# Patient Record
Sex: Male | Born: 1970 | Race: White | Hispanic: No | Smoking: Current every day smoker
Health system: Southern US, Community
[De-identification: ages and names within clinical notes are randomized; demographics above are authoritative.]

## PROBLEM LIST (undated history)

## (undated) DIAGNOSIS — Z87442 Personal history of urinary calculi: Secondary | ICD-10-CM

## (undated) DIAGNOSIS — G35 Multiple sclerosis: Secondary | ICD-10-CM

## (undated) DIAGNOSIS — M545 Low back pain, unspecified: Secondary | ICD-10-CM

## (undated) DIAGNOSIS — G9619 Other disorders of meninges, not elsewhere classified: Secondary | ICD-10-CM

## (undated) DIAGNOSIS — F32A Depression, unspecified: Secondary | ICD-10-CM

## (undated) DIAGNOSIS — F329 Major depressive disorder, single episode, unspecified: Secondary | ICD-10-CM

## (undated) DIAGNOSIS — G43909 Migraine, unspecified, not intractable, without status migrainosus: Secondary | ICD-10-CM

## (undated) DIAGNOSIS — G971 Other reaction to spinal and lumbar puncture: Secondary | ICD-10-CM

## (undated) DIAGNOSIS — J449 Chronic obstructive pulmonary disease, unspecified: Secondary | ICD-10-CM

## (undated) DIAGNOSIS — G8929 Other chronic pain: Secondary | ICD-10-CM

## (undated) HISTORY — DX: Depression, unspecified: F32.A

## (undated) HISTORY — PX: LITHOTRIPSY: SUR834

## (undated) HISTORY — PX: EPIDURAL BLOOD PATCH: SHX1517

## (undated) HISTORY — DX: Chronic obstructive pulmonary disease, unspecified: J44.9

## (undated) HISTORY — PX: BACK SURGERY: SHX140

## (undated) HISTORY — DX: Major depressive disorder, single episode, unspecified: F32.9

---

## 2001-08-21 ENCOUNTER — Encounter: Payer: Self-pay | Admitting: *Deleted

## 2001-08-21 ENCOUNTER — Emergency Department (HOSPITAL_COMMUNITY): Admission: EM | Admit: 2001-08-21 | Discharge: 2001-08-21 | Payer: Self-pay | Admitting: *Deleted

## 2003-04-06 ENCOUNTER — Encounter: Payer: Self-pay | Admitting: Emergency Medicine

## 2003-04-07 ENCOUNTER — Observation Stay (HOSPITAL_COMMUNITY): Admission: EM | Admit: 2003-04-07 | Discharge: 2003-04-07 | Payer: Self-pay | Admitting: Emergency Medicine

## 2003-04-07 ENCOUNTER — Encounter: Payer: Self-pay | Admitting: Orthopedic Surgery

## 2004-03-11 ENCOUNTER — Emergency Department (HOSPITAL_COMMUNITY): Admission: EM | Admit: 2004-03-11 | Discharge: 2004-03-11 | Payer: Self-pay | Admitting: Emergency Medicine

## 2004-03-23 ENCOUNTER — Emergency Department (HOSPITAL_COMMUNITY): Admission: EM | Admit: 2004-03-23 | Discharge: 2004-03-23 | Payer: Self-pay | Admitting: Emergency Medicine

## 2004-06-07 ENCOUNTER — Encounter: Payer: Self-pay | Admitting: *Deleted

## 2004-06-07 ENCOUNTER — Inpatient Hospital Stay (HOSPITAL_COMMUNITY): Admission: EM | Admit: 2004-06-07 | Discharge: 2004-06-09 | Payer: Self-pay

## 2005-07-03 ENCOUNTER — Emergency Department (HOSPITAL_COMMUNITY): Admission: EM | Admit: 2005-07-03 | Discharge: 2005-07-03 | Payer: Self-pay | Admitting: Emergency Medicine

## 2014-07-04 DIAGNOSIS — G9389 Other specified disorders of brain: Secondary | ICD-10-CM | POA: Diagnosis not present

## 2014-07-04 DIAGNOSIS — M4040 Postural lordosis, site unspecified: Secondary | ICD-10-CM | POA: Diagnosis not present

## 2014-07-04 DIAGNOSIS — M4180 Other forms of scoliosis, site unspecified: Secondary | ICD-10-CM | POA: Diagnosis not present

## 2014-07-08 DIAGNOSIS — R5381 Other malaise: Secondary | ICD-10-CM | POA: Diagnosis not present

## 2014-07-08 DIAGNOSIS — G3184 Mild cognitive impairment, so stated: Secondary | ICD-10-CM | POA: Diagnosis not present

## 2014-07-08 DIAGNOSIS — G894 Chronic pain syndrome: Secondary | ICD-10-CM | POA: Diagnosis not present

## 2014-07-08 DIAGNOSIS — G35 Multiple sclerosis: Secondary | ICD-10-CM | POA: Diagnosis not present

## 2014-07-08 DIAGNOSIS — R5383 Other fatigue: Secondary | ICD-10-CM | POA: Diagnosis not present

## 2014-07-08 DIAGNOSIS — M5430 Sciatica, unspecified side: Secondary | ICD-10-CM | POA: Diagnosis not present

## 2014-07-11 DIAGNOSIS — G35 Multiple sclerosis: Secondary | ICD-10-CM | POA: Diagnosis not present

## 2014-07-12 DIAGNOSIS — G35 Multiple sclerosis: Secondary | ICD-10-CM | POA: Diagnosis not present

## 2014-07-29 DIAGNOSIS — G894 Chronic pain syndrome: Secondary | ICD-10-CM | POA: Diagnosis not present

## 2014-07-29 DIAGNOSIS — B182 Chronic viral hepatitis C: Secondary | ICD-10-CM | POA: Diagnosis not present

## 2014-07-29 DIAGNOSIS — G3184 Mild cognitive impairment, so stated: Secondary | ICD-10-CM | POA: Diagnosis not present

## 2014-07-29 DIAGNOSIS — G35 Multiple sclerosis: Secondary | ICD-10-CM | POA: Diagnosis not present

## 2014-07-29 DIAGNOSIS — R531 Weakness: Secondary | ICD-10-CM | POA: Diagnosis not present

## 2014-08-26 DIAGNOSIS — R531 Weakness: Secondary | ICD-10-CM | POA: Diagnosis not present

## 2014-08-26 DIAGNOSIS — B182 Chronic viral hepatitis C: Secondary | ICD-10-CM | POA: Diagnosis not present

## 2014-08-26 DIAGNOSIS — G3184 Mild cognitive impairment, so stated: Secondary | ICD-10-CM | POA: Diagnosis not present

## 2014-08-26 DIAGNOSIS — G35 Multiple sclerosis: Secondary | ICD-10-CM | POA: Diagnosis not present

## 2014-09-09 DIAGNOSIS — G35 Multiple sclerosis: Secondary | ICD-10-CM | POA: Diagnosis not present

## 2014-09-16 DIAGNOSIS — S92351A Displaced fracture of fifth metatarsal bone, right foot, initial encounter for closed fracture: Secondary | ICD-10-CM | POA: Diagnosis not present

## 2014-09-16 DIAGNOSIS — W1839XA Other fall on same level, initial encounter: Secondary | ICD-10-CM | POA: Diagnosis not present

## 2014-09-16 DIAGNOSIS — S0081XA Abrasion of other part of head, initial encounter: Secondary | ICD-10-CM | POA: Diagnosis not present

## 2014-09-16 DIAGNOSIS — R0789 Other chest pain: Secondary | ICD-10-CM | POA: Diagnosis not present

## 2014-10-07 DIAGNOSIS — G35 Multiple sclerosis: Secondary | ICD-10-CM | POA: Diagnosis not present

## 2014-10-28 DIAGNOSIS — G35 Multiple sclerosis: Secondary | ICD-10-CM | POA: Diagnosis not present

## 2014-10-28 DIAGNOSIS — R531 Weakness: Secondary | ICD-10-CM | POA: Diagnosis not present

## 2014-10-28 DIAGNOSIS — G3184 Mild cognitive impairment, so stated: Secondary | ICD-10-CM | POA: Diagnosis not present

## 2014-10-28 DIAGNOSIS — B182 Chronic viral hepatitis C: Secondary | ICD-10-CM | POA: Diagnosis not present

## 2014-10-29 DIAGNOSIS — G35 Multiple sclerosis: Secondary | ICD-10-CM | POA: Diagnosis not present

## 2014-10-29 DIAGNOSIS — M545 Low back pain: Secondary | ICD-10-CM | POA: Diagnosis not present

## 2014-11-11 DIAGNOSIS — G35 Multiple sclerosis: Secondary | ICD-10-CM | POA: Diagnosis not present

## 2014-11-28 DIAGNOSIS — F328 Other depressive episodes: Secondary | ICD-10-CM | POA: Diagnosis not present

## 2014-11-28 DIAGNOSIS — Z5181 Encounter for therapeutic drug level monitoring: Secondary | ICD-10-CM | POA: Diagnosis not present

## 2014-11-28 DIAGNOSIS — G35 Multiple sclerosis: Secondary | ICD-10-CM | POA: Diagnosis not present

## 2014-11-28 DIAGNOSIS — M545 Low back pain: Secondary | ICD-10-CM | POA: Diagnosis not present

## 2014-11-28 DIAGNOSIS — M542 Cervicalgia: Secondary | ICD-10-CM | POA: Diagnosis not present

## 2014-12-09 DIAGNOSIS — G35 Multiple sclerosis: Secondary | ICD-10-CM | POA: Diagnosis not present

## 2014-12-13 ENCOUNTER — Emergency Department (HOSPITAL_COMMUNITY)
Admission: EM | Admit: 2014-12-13 | Discharge: 2014-12-13 | Disposition: A | Discharge status: Home / Self Care | Payer: Medicare Other | Source: Home / Self Care | Attending: Emergency Medicine | Admitting: Emergency Medicine

## 2014-12-13 ENCOUNTER — Emergency Department (INDEPENDENT_AMBULATORY_CARE_PROVIDER_SITE_OTHER): Payer: Medicare Other

## 2014-12-13 ENCOUNTER — Encounter (HOSPITAL_COMMUNITY): Payer: Self-pay | Admitting: Emergency Medicine

## 2014-12-13 DIAGNOSIS — S42032A Displaced fracture of lateral end of left clavicle, initial encounter for closed fracture: Secondary | ICD-10-CM

## 2014-12-13 DIAGNOSIS — S20212A Contusion of left front wall of thorax, initial encounter: Secondary | ICD-10-CM

## 2014-12-13 HISTORY — DX: Multiple sclerosis: G35

## 2014-12-13 MED ORDER — OXYCODONE HCL 10 MG PO TABS: 10.0000 mg | ORAL_TABLET | Freq: Four times a day (QID) | ORAL | Status: DC | PRN

## 2014-12-13 NOTE — Discharge Instructions (Signed)
Clavicle Fracture (Distal End) °with Rehab °Distal clavicle fractures are breaks in the collarbone (clavicle) that occur in the outer third portion of the bone, near the joint between the collarbone and one of the shoulder bones (acromion). These breaks (fractures) may be complete or incomplete. If the fracture extends into the joint at the top of the shoulder, it may also cause damage to the ligaments there (acromioclavicular and coracoclavicular). These two ligaments are responsible for attaching the collarbone to the shoulder bone. °SYMPTOMS  °· Pain, tenderness, and swelling on top of the shoulder. °· Visible deformity or bump over the fracture site, if the fracture is complete and the bone fragments separate enough to distort the appearance of the top of the shoulder. °· Bruising (contusion) at the site of injury (usually within 48 hours). °· Loss of strength, or pain with use of the affected arm. °· Sometimes, numbness or coldness in the shoulder and arm on the affected side if the blood supply is impaired. °· Uncommonly, shortness of breath or difficulty breathing. °CAUSES  °Distal clavicle fractures are usually caused by direct hit (trauma) to the area of injury. The injury may also occur from indirect trauma, such as falling on an outstretched hand (uncommon). °RISK INCREASES WITH: °· Sports that require contact or collision (football, soccer, hockey, rugby). °· Sports with high risk of falling on the shoulder (rodeo riding, mountain bike riding, cycling). °· Previous shoulder injury. °· Improperly fitted or padded protective equipment. °· History of bone or joint disease (osteoporosis, bone tumors). °PREVENTION °· Warm up and stretch properly before activity. °· Maintain physical fitness: °¨ Strength, flexibility, and endurance. °¨ Cardiovascular fitness. °· Wear properly fitted and padded protective equipment. °· Learn and use proper technique, and have a coach correct improper technique (including  falling). °PROGNOSIS  °If treated properly, distal clavicle fractures usually can be expected to heal. However, surgery may be needed.  °RELATED COMPLICATIONS  °· Pressure on or injury to nearby nerves, ligaments, tendons, muscles, blood vessels, or other tissues. °· Weakness and fatigue of the arm or shoulder (uncommon). °· Fracture fails to heal (nonunion). °· Fracture heals in improper position (malunion). °· Arthritis, pain, and inflammation of the acromioclavicular (AC) joint. °· Longer healing time and vulnerable to recurring injury, if usual activities are resumed too soon. °· Excessive scar tissue at the fracture site, including excessive bone formation, causing pressure on nerves and blood vessels in the neck or armpit. This may lead to pain, numbness, and tingling in the neck, shoulder, arms, and hands. °· Infection if the bone breaks through the skin (open fracture), or at the incision if surgery is performed. °· Persistent bump (prominence) at the fracture site. °· Vulnerable to repeated collarbone injury. °TREATMENT  °Treatment first involves the use of ice, medicine, and compressive bandages to reduce pain and inflammation. The shoulder should be immediately restrained. It is important to have an orthopedic specialist look at the fracture to determine if surgery is needed to realign the bones if the fracture is out of alignment. Surgery involves repositioning the bones and fixing them in place with screws, pins, and plates. It may be necessary to remove the hardware after the fracture heals. After the fracture heals, it is important to complete stretching and strengthening exercises in order to regain strength and a full range of motion before you are able to return to sports. These exercises may be completed at home or with a therapist. If surgery is required, return to sports can be expected   in 2 to 6 months.  °MEDICATION  °· If pain medicine is needed, nonsteroidal anti-inflammatory medicines  (aspirin and ibuprofen), or other minor pain relievers (acetaminophen), are often advised. °· Do not take pain medicine for 7 days before surgery. °· Prescription pain relievers may be given if your caregiver thinks they are needed. Use only as directed and only as much as you need. °COLD THERAPY  °· Cold treatment (icing) should be applied for 10 to 15 minutes every 2 to 3 hours for inflammation and pain, and immediately after activity that aggravates your symptoms. Use ice packs or an ice massage. °SEEK MEDICAL CARE IF:  °· Pain, swelling, or bruising gets worse despite treatment. °· You experience pain, numbness, or coldness in the arm. °· Blue, gray, or dark color appears in the hand or fingernails. °· You have increased pain, swelling, or drainage of fluids in the affected area. °· You experience signs of infection: increased pain, swelling, drainage of fluids, fever, or a general ill feeling. °· New, unexplained symptoms develop. (Drugs used in treatment may produce side effects.) °EXERCISES °RANGE OF MOTION (ROM) AND STRETCHING EXERCISES - Clavicle Fracture (Distal End) °These exercises may help you restore your elbow mobility once your physician has discontinued your restraint period. Beginning exercises before your caregiver's approval may result in delayed healing. Your symptoms may go away with or without further involvement from your physician, physical therapist, or athletic trainer. While completing these exercises, remember:  °· Restoring tissue flexibility helps normal motion to return to the joints. This allows healthier, less painful movement and activity. °· An effective stretch should be held for at least 30 seconds. A stretch should never be painful. You should only feel a gentle lengthening or release in the stretch. °ROM - Pendulum  °· Bend at the waist, so that your right / left arm falls away from your body. Support yourself with your opposite hand on a solid surface, such as a table or a  countertop. °· Your right / left arm should be perpendicular to the ground. If it is not perpendicular, you need to lean over farther. Relax the muscles in your right / left arm and shoulder as much as possible. °· Gently sway your hips and trunk, so they move your right / left arm without any use of your right / left shoulder muscles. °· Progress your movements so that your right / left arm moves side to side, then forward and backward, and finally, both clockwise and counterclockwise. °· Complete __________ repetitions in each direction. Many people use this exercise to relieve discomfort in their shoulder, as well as to gain range of motion. °Repeat __________ times. Complete this exercise __________ times per day. °STRETCH - Flexion, Seated  °· Sit in a firm chair, so that your right / left forearm can rest on a table or countertop. Your right / left elbow should rest below the height of your shoulder, so that your shoulder feels supported and not tense or uncomfortable. °· Keeping your right / left shoulder relaxed, lean forward at your waist, allowing your right / left hand to slide forward. Bend forward until you feel a moderate stretch in your shoulder, but before you feel an increase in your pain. °· Hold for __________ seconds. Slowly return to your starting position. °Repeat __________ times. Complete this exercise __________ times per day.  °STRETCH - Flexion, Standing  °· Stand with good posture. With an underhand grip on your right / left hand and an overhand   grip on the opposite hand, grasp a broomstick or cane so that your hands are a little more than shoulder width apart. °· Keeping your right / left elbow straight and shoulder muscles relaxed, push the stick with your opposite hand to raise your right / left arm in front of your body and then overhead. Raise your arm until you feel a stretch in your right / left shoulder, but before you have increased shoulder pain. °· Try to avoid shrugging your  right / left shoulder as your arm rises, by keeping your shoulder blade tucked down and toward your mid-back spine. Hold for __________ seconds. °· Slowly return to the starting position. °Repeat __________ times. Complete this exercise __________ times per day.  °STRETCH - Abduction, Supine  °· Lie on your back. With an underhand grip on your right / left hand and an overhand grip on the opposite hand, grasp a broomstick or cane so that your hands are a little more than shoulder width apart. °· Keeping your right / left elbow straight and shoulder muscles relaxed, push the stick with your opposite hand to raise your right / left arm out to the side of your body and then overhead. Raise your arm until you feel a stretch in your right / left shoulder, but before you have increased shoulder pain. °· Try to avoid shrugging your right / left shoulder as your arm rises, by keeping your shoulder blade tucked down and toward your mid-back spine. Hold for __________ seconds. °· Slowly return to the starting position. °Repeat __________ times. Complete this exercise __________ times per day.  °ROM - Flexion, Active-Assisted °· Lie on your back. You may bend your knees for comfort. °· Grasp a broomstick or cane, so your hands are about shoulder width apart. Your right / left hand should grip the end of the stick, so that your hand is positioned "thumbs-up," as if you were about to shake hands. °· Using your healthy arm to lead, raise your right / left arm overhead, until you feel a gentle stretch in your shoulder. Hold for __________ seconds. °· Use the stick to assist in returning your right / left arm to its starting position. °Repeat __________ times. Complete this exercise __________ times per day.  °STRETCH - Flexion, Standing  °· Stand facing a wall. Walk your right / left fingers up the wall, until you feel a moderate stretch in your shoulder. As your hand gets higher, you may need to step closer to the wall or use a  door frame to walk through. °· Try to avoid shrugging your right / left shoulder as your arm rises, by keeping your shoulder blade tucked down and toward your mid-back spine. °· Hold for __________ seconds. Use your other hand, if needed, to ease out of the stretch and return to the starting position. °Repeat __________ times. Complete this exercise __________ times per day.  °STRENGTHENING EXERCISES - Clavicle Fracture (Distal End) °These exercises may help you when beginning to rehabilitate your injury. They may resolve your symptoms with or without further involvement from your physician, physical therapist or athletic trainer. While completing these exercises, remember:  °· Muscles can gain both the endurance and the strength needed for everyday activities through controlled exercises. °· Complete these exercises as instructed by your physician, physical therapist or athletic trainer. Increase the resistance and repetitions only as guided. °· You may experience muscle soreness or fatigue, but the pain or discomfort you are trying to eliminate should never   worsen during these exercises. If this pain does get worse, stop and make sure you are following the directions exactly. If the pain is still present after adjustments, discontinue the exercise until you can discuss the trouble with your caregiver. °STRENGTH - Shoulder Abductors, Isometric  °· With good posture, stand or sit about 4-6 inches from a wall, with your right / left side facing the wall. °· Bend your right / left elbow. Gently press your right / left elbow into the wall. Increase the pressure gradually until you are pressing as hard as you can, without shrugging your shoulder or increasing any shoulder discomfort. °· Hold for __________ seconds. °· Release the tension slowly. Relax your shoulder muscles completely before you start the next repetition. °Repeat __________ times. Complete this exercise __________ times per day.  °STRENGTH - Shoulder  Flexion, Isometric  °· With good posture, stand or sit about 4-6 inches from a wall. °· Keeping your right / left elbow straight, gently press the top of your fist into the wall. Increase the pressure gradually until you are pressing as hard as you can, without shrugging your shoulder or increasing any shoulder discomfort. °· Hold for __________ seconds. °· Release the tension slowly. Relax your shoulder muscles completely before you start the next repetition. °Repeat __________ times. Complete this exercise __________ times per day.  °STRENGTH - External Rotators °· Secure a rubber exercise band or tubing to a fixed object (table, pole), so that it is at the same height as your right / left elbow, when you are standing or sitting on a firm surface. °· Stand or sit so that the secured exercise band is at your healthy side. °· Bend your right / left elbow 90 degrees. Place a folded towel or small pillow under your right / left arm, so that your elbow is a few inches away from your side. °· Keeping the tension on the exercise band, pull it away from your body, as if pivoting on your elbow. Be sure to keep your body steady, so that the movement is coming from only your rotating shoulder. °· Hold for __________ seconds. Release the tension in a controlled manner, as you return to the starting position. °Repeat __________ times. Complete this exercise __________ times per day.  °STRENGTH - Internal Rotators  °· Secure a rubber exercise band or tubing to a fixed object (table, pole) so that it is at the same height as your right / left elbow, when you are standing or sitting on a firm surface. °· Stand or sit so that the secured exercise band is at your right / left side. °· Bend your right / left elbow 90 degrees. Place a folded towel or small pillow under your right / left arm, so that your elbow is a few inches away from your side. °· Keeping the tension on the exercise band, pull it across your body toward your  stomach. Be sure to keep your body steady, so that the movement is coming from only your rotating shoulder. °· Hold for __________ seconds. Release the tension in a controlled manner, as you return to the starting position. °Repeat __________ times. Complete this exercise __________ times per day.  °Document Released: 09/27/2005 Document Revised: 02/11/2014 Document Reviewed: 01/09/2009 °[employer]® Patient Information ©2015 [employer]. This information is not intended to replace advice given to you by your health care provider. Make sure you discuss any questions you have with your health care provider. ° °

## 2014-12-13 NOTE — ED Provider Notes (Signed)
CSN: [id number]     Arrival date & time 12/13/14  1931 History   First MD Initiated Contact with Patient 12/13/14 1959     Chief Complaint  Patient presents with  . Fall   (Consider location/radiation/quality/duration/timing/severity/associated sxs/prior Treatment) HPI         44 year old male presents complaining of left shoulder and rib cage pain. He has a history of multiple sclerosis and has occasional falls associated with this. This morning he fell onto a glass coffee table. He thinks he broke his collarbone and some ribs. He has severe pain, worse with movement, worse with taking a deep breath. No treatments tried at home.    Past Medical History  Diagnosis Date  . Multiple sclerosis    History reviewed. No pertinent past surgical history. No family history on file. History  Substance Use Topics  . Smoking status: Current Every Day Smoker -- 0.75 packs/day    Types: Cigarettes  . Smokeless tobacco: Not on file  . Alcohol Use: No    Review of Systems  Musculoskeletal:       Pain in left shoulder and left side rib cage, see history of present illness  All other systems reviewed and are negative.   Allergies  Review of patient's allergies indicates no known allergies.  Home Medications   Prior to Admission medications   Medication Sig Start Date End Date Taking? Authorizing Provider  DULoxetine (CYMBALTA) 20 MG capsule Take 20 mg by mouth daily.   Yes Historical Provider, MD  oxybutynin (DITROPAN-XL) 5 MG 24 hr tablet Take 5 mg by mouth at bedtime.   Yes Historical Provider, MD  Oxycodone HCl 10 MG TABS Take 1 tablet (10 mg total) by mouth every 6 (six) hours as needed. 12/13/14   Adrian Blackwater Jevante Hollibaugh, PA-C   BP 138/88 mmHg  Pulse 105  Temp(Src) 97.5 F (36.4 C) (Oral)  Resp 20  SpO2 99% Physical Exam  Constitutional: He is oriented to person, place, and time. He appears well-developed and well-nourished. No distress.  HENT:  Head: Normocephalic.  Pulmonary/Chest:  Effort normal. No respiratory distress. He exhibits tenderness (diffuse tenderness about the left chest wall, from the midaxillary to the anterior axillary line of the lower ribs). He exhibits no mass, no crepitus, no deformity and no retraction.  Musculoskeletal:  He is diffusely tender about the left shoulder with limited range of motion through all fields of motion, both active and passive limitations.  Neurological: He is alert and oriented to person, place, and time. Coordination normal.  Skin: Skin is warm and dry. Laceration (1 cm abrasion above left eye, from the fall earlier, he doesnt want this addressed ) noted. No rash noted. He is not diaphoretic.  Multiple tattoos  Psychiatric: He has a normal mood and affect. Judgment normal.  Nursing note and vitals reviewed.   ED Course  Procedures (including critical care time) Labs Review Labs Reviewed - No data to display  Imaging Review Dg Ribs Unilateral W/chest Left  12/13/2014   CLINICAL DATA:  Fall onto coffee table with left lower chest wall pain (based on marker). Initial encounter.  EXAM: LEFT RIBS AND CHEST - 3+ VIEW  COMPARISON:  04/30/2014  FINDINGS: Remote left sixth and seventh rib fractures anteriorly.  There is a comminuted fracture of the lateral third left clavicle. Dedicated shoulder imaging is available.  Normal heart size and mediastinal contours. No acute infiltrate or edema. No effusion or pneumothorax.  IMPRESSION: 1. Lateral left clavicle fracture.  Reference  dedicated imaging. 2. No acute rib fracture. 3. Remote left sixth and seventh rib fractures.   Electronically Signed   By: Marnee Spring M.D.   On: 12/13/2014 21:08   Dg Shoulder Left  12/13/2014   CLINICAL DATA:  Initial evaluation for left shoulder pain after fall  EXAM: LEFT SHOULDER - 2+ VIEW  COMPARISON:  None.  FINDINGS: No glenohumeral dislocation. Proximal humerus is intact. There are a few small bone islands in the humeral head.  There appears to be a  fracture through the distal third of the clavicle. Inferior fracture fragment is displaced inferiorly by about 1 cm. The fractures comminuted and also involves the lateral tip of the clavicle.  IMPRESSION: Comminuted fracture of the distal third of the left clavicle, moderately displaced.   Electronically Signed   By: Esperanza Heir M.D.   On: 12/13/2014 21:19     MDM   1. Closed fracture of distal clavicle, left, initial encounter   2. Chest wall contusion, left, initial encounter    x-ray reveals a comminuted fracture of the lateral third left clavicle. I discussed this with the on-call orthopedist who recommended sling and follow-up as an outpatient with instructions to call for an appointment first thing Monday morning.Maryclare Labrador give oxycodone for pain. Also, for the chest wall contusion, I will give him incentive spirometer to prevent atelectasis. Follow-up when necessary.  Discharge Medication List as of 12/13/2014 10:00 PM    START taking these medications   Details  Oxycodone HCl 10 MG TABS Take 1 tablet (10 mg total) by mouth every 6 (six) hours as needed., Starting 12/13/2014, Until Discontinued, Print          Graylon Good, PA-C 12/13/14 [id number]

## 2014-12-13 NOTE — ED Notes (Signed)
Patient c/o fall this morning on a glass coffee table with pain on his right side shoulder and lateral. Patient reports he is already on pain medication and he is still having pain. Patient reports some difficulty breathing. NAD.

## 2014-12-15 DIAGNOSIS — S42025D Nondisplaced fracture of shaft of left clavicle, subsequent encounter for fracture with routine healing: Secondary | ICD-10-CM | POA: Diagnosis not present

## 2014-12-15 DIAGNOSIS — F17210 Nicotine dependence, cigarettes, uncomplicated: Secondary | ICD-10-CM | POA: Diagnosis not present

## 2014-12-15 DIAGNOSIS — Z79899 Other long term (current) drug therapy: Secondary | ICD-10-CM | POA: Diagnosis not present

## 2014-12-15 DIAGNOSIS — M25512 Pain in left shoulder: Secondary | ICD-10-CM | POA: Diagnosis not present

## 2014-12-15 DIAGNOSIS — S42032D Displaced fracture of lateral end of left clavicle, subsequent encounter for fracture with routine healing: Secondary | ICD-10-CM | POA: Diagnosis not present

## 2014-12-15 DIAGNOSIS — F329 Major depressive disorder, single episode, unspecified: Secondary | ICD-10-CM | POA: Diagnosis not present

## 2014-12-22 DIAGNOSIS — S42032K Displaced fracture of lateral end of left clavicle, subsequent encounter for fracture with nonunion: Secondary | ICD-10-CM | POA: Diagnosis not present

## 2014-12-22 DIAGNOSIS — S42002A Fracture of unspecified part of left clavicle, initial encounter for closed fracture: Secondary | ICD-10-CM | POA: Diagnosis not present

## 2014-12-26 DIAGNOSIS — E782 Mixed hyperlipidemia: Secondary | ICD-10-CM | POA: Diagnosis not present

## 2014-12-26 DIAGNOSIS — F328 Other depressive episodes: Secondary | ICD-10-CM | POA: Diagnosis not present

## 2014-12-26 DIAGNOSIS — E119 Type 2 diabetes mellitus without complications: Secondary | ICD-10-CM | POA: Diagnosis not present

## 2014-12-26 DIAGNOSIS — E0590 Thyrotoxicosis, unspecified without thyrotoxic crisis or storm: Secondary | ICD-10-CM | POA: Diagnosis not present

## 2014-12-26 DIAGNOSIS — R252 Cramp and spasm: Secondary | ICD-10-CM | POA: Diagnosis not present

## 2014-12-26 DIAGNOSIS — E559 Vitamin D deficiency, unspecified: Secondary | ICD-10-CM | POA: Diagnosis not present

## 2014-12-26 DIAGNOSIS — M545 Low back pain: Secondary | ICD-10-CM | POA: Diagnosis not present

## 2014-12-26 DIAGNOSIS — G35 Multiple sclerosis: Secondary | ICD-10-CM | POA: Diagnosis not present

## 2014-12-26 DIAGNOSIS — M2550 Pain in unspecified joint: Secondary | ICD-10-CM | POA: Diagnosis not present

## 2014-12-26 DIAGNOSIS — I10 Essential (primary) hypertension: Secondary | ICD-10-CM | POA: Diagnosis not present

## 2014-12-26 DIAGNOSIS — E039 Hypothyroidism, unspecified: Secondary | ICD-10-CM | POA: Diagnosis not present

## 2014-12-26 DIAGNOSIS — Z5181 Encounter for therapeutic drug level monitoring: Secondary | ICD-10-CM | POA: Diagnosis not present

## 2015-01-06 DIAGNOSIS — G35 Multiple sclerosis: Secondary | ICD-10-CM | POA: Diagnosis not present

## 2015-01-23 DIAGNOSIS — Z720 Tobacco use: Secondary | ICD-10-CM | POA: Diagnosis not present

## 2015-01-23 DIAGNOSIS — Z5181 Encounter for therapeutic drug level monitoring: Secondary | ICD-10-CM | POA: Diagnosis not present

## 2015-01-23 DIAGNOSIS — G35 Multiple sclerosis: Secondary | ICD-10-CM | POA: Diagnosis not present

## 2015-01-23 DIAGNOSIS — M545 Low back pain: Secondary | ICD-10-CM | POA: Diagnosis not present

## 2015-02-18 DIAGNOSIS — G35 Multiple sclerosis: Secondary | ICD-10-CM | POA: Diagnosis not present

## 2015-02-20 DIAGNOSIS — M545 Low back pain: Secondary | ICD-10-CM | POA: Diagnosis not present

## 2015-02-20 DIAGNOSIS — Z5181 Encounter for therapeutic drug level monitoring: Secondary | ICD-10-CM | POA: Diagnosis not present

## 2015-02-20 DIAGNOSIS — F328 Other depressive episodes: Secondary | ICD-10-CM | POA: Diagnosis not present

## 2015-02-20 DIAGNOSIS — G35 Multiple sclerosis: Secondary | ICD-10-CM | POA: Diagnosis not present

## 2015-03-07 DIAGNOSIS — G3184 Mild cognitive impairment, so stated: Secondary | ICD-10-CM | POA: Diagnosis not present

## 2015-03-07 DIAGNOSIS — B182 Chronic viral hepatitis C: Secondary | ICD-10-CM | POA: Diagnosis not present

## 2015-03-07 DIAGNOSIS — G35 Multiple sclerosis: Secondary | ICD-10-CM | POA: Diagnosis not present

## 2015-03-07 DIAGNOSIS — R531 Weakness: Secondary | ICD-10-CM | POA: Diagnosis not present

## 2015-03-11 DIAGNOSIS — G35 Multiple sclerosis: Secondary | ICD-10-CM | POA: Diagnosis not present

## 2015-03-23 ENCOUNTER — Emergency Department (HOSPITAL_COMMUNITY): Payer: No Typology Code available for payment source

## 2015-03-23 ENCOUNTER — Encounter (HOSPITAL_COMMUNITY): Payer: Self-pay | Admitting: Emergency Medicine

## 2015-03-23 ENCOUNTER — Emergency Department (HOSPITAL_COMMUNITY)
Admission: EM | Admit: 2015-03-23 | Discharge: 2015-03-23 | Disposition: A | Discharge status: Home / Self Care | Payer: No Typology Code available for payment source | Attending: Emergency Medicine | Admitting: Emergency Medicine

## 2015-03-23 DIAGNOSIS — Z8669 Personal history of other diseases of the nervous system and sense organs: Secondary | ICD-10-CM | POA: Insufficient documentation

## 2015-03-23 DIAGNOSIS — H547 Unspecified visual loss: Secondary | ICD-10-CM | POA: Diagnosis not present

## 2015-03-23 DIAGNOSIS — Y998 Other external cause status: Secondary | ICD-10-CM | POA: Insufficient documentation

## 2015-03-23 DIAGNOSIS — Y9389 Activity, other specified: Secondary | ICD-10-CM | POA: Diagnosis not present

## 2015-03-23 DIAGNOSIS — Y92410 Unspecified street and highway as the place of occurrence of the external cause: Secondary | ICD-10-CM | POA: Diagnosis not present

## 2015-03-23 DIAGNOSIS — S8992XA Unspecified injury of left lower leg, initial encounter: Secondary | ICD-10-CM | POA: Diagnosis not present

## 2015-03-23 DIAGNOSIS — M25551 Pain in right hip: Secondary | ICD-10-CM | POA: Diagnosis not present

## 2015-03-23 DIAGNOSIS — Z720 Tobacco use: Secondary | ICD-10-CM | POA: Insufficient documentation

## 2015-03-23 DIAGNOSIS — S199XXA Unspecified injury of neck, initial encounter: Secondary | ICD-10-CM | POA: Diagnosis present

## 2015-03-23 DIAGNOSIS — H53453 Other localized visual field defect, bilateral: Secondary | ICD-10-CM

## 2015-03-23 DIAGNOSIS — H538 Other visual disturbances: Secondary | ICD-10-CM | POA: Insufficient documentation

## 2015-03-23 DIAGNOSIS — S3992XA Unspecified injury of lower back, initial encounter: Secondary | ICD-10-CM | POA: Insufficient documentation

## 2015-03-23 DIAGNOSIS — R000 Tachycardia, unspecified: Secondary | ICD-10-CM | POA: Insufficient documentation

## 2015-03-23 DIAGNOSIS — S79911A Unspecified injury of right hip, initial encounter: Secondary | ICD-10-CM | POA: Diagnosis not present

## 2015-03-23 DIAGNOSIS — R51 Headache: Secondary | ICD-10-CM | POA: Diagnosis not present

## 2015-03-23 DIAGNOSIS — H543 Unqualified visual loss, both eyes: Secondary | ICD-10-CM | POA: Diagnosis not present

## 2015-03-23 DIAGNOSIS — S161XXA Strain of muscle, fascia and tendon at neck level, initial encounter: Secondary | ICD-10-CM | POA: Diagnosis not present

## 2015-03-23 DIAGNOSIS — S0990XA Unspecified injury of head, initial encounter: Secondary | ICD-10-CM | POA: Diagnosis not present

## 2015-03-23 DIAGNOSIS — Z79899 Other long term (current) drug therapy: Secondary | ICD-10-CM | POA: Diagnosis not present

## 2015-03-23 LAB — CBC WITH DIFFERENTIAL/PLATELET
Basophils Absolute: 0 10*3/uL (ref 0.0–0.1)
Basophils Relative: 0 % (ref 0–1)
Eosinophils Absolute: 0.6 10*3/uL (ref 0.0–0.7)
Eosinophils Relative: 5 % (ref 0–5)
HEMATOCRIT: 45.7 % (ref 39.0–52.0)
HEMOGLOBIN: 16.2 g/dL (ref 13.0–17.0)
LYMPHS ABS: 3.4 10*3/uL (ref 0.7–4.0)
LYMPHS PCT: 30 % (ref 12–46)
MCH: 32.8 pg (ref 26.0–34.0)
MCHC: 35.4 g/dL (ref 30.0–36.0)
MCV: 92.5 fL (ref 78.0–100.0)
MONO ABS: 0.9 10*3/uL (ref 0.1–1.0)
MONOS PCT: 8 % (ref 3–12)
NEUTROS ABS: 6.4 10*3/uL (ref 1.7–7.7)
NEUTROS PCT: 57 % (ref 43–77)
Platelets: 215 10*3/uL (ref 150–400)
RBC: 4.94 MIL/uL (ref 4.22–5.81)
RDW: 14 % (ref 11.5–15.5)
WBC: 11.3 10*3/uL — AB (ref 4.0–10.5)

## 2015-03-23 LAB — BASIC METABOLIC PANEL
Anion gap: 9 (ref 5–15)
BUN: 8 mg/dL (ref 6–20)
CO2: 27 mmol/L (ref 22–32)
Calcium: 9.5 mg/dL (ref 8.9–10.3)
Chloride: 102 mmol/L (ref 101–111)
Creatinine, Ser: 1.03 mg/dL (ref 0.61–1.24)
GFR calc Af Amer: >60 mL/min (ref >60)
GFR calc non Af Amer: >60 mL/min (ref >60)
Glucose, Bld: 106 mg/dL — ABNORMAL HIGH (ref 65–99)
POTASSIUM: 4.2 mmol/L (ref 3.5–5.1)
SODIUM: 138 mmol/L (ref 135–145)

## 2015-03-23 MED ORDER — MORPHINE SULFATE 4 MG/ML IJ SOLN: 4.0000 mg | Freq: Once | INTRAMUSCULAR | Status: DC
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
## 2015-03-23 NOTE — ED Notes (Signed)
Pt undressed, in gown, on continuous pulse oximetry and blood pressure cuff 
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
## 2015-03-23 NOTE — ED Notes (Signed)
Pt has returned from being out of the department; pt placed back on continuous pulse oximetry and blood pressure cuff 

## 2015-03-23 NOTE — ED Notes (Signed)
PT reports RT sided weakness from MS . PT has decreased pulses in RFT foot.
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
## 2015-03-24 DIAGNOSIS — I10 Essential (primary) hypertension: Secondary | ICD-10-CM | POA: Diagnosis not present

## 2015-03-24 DIAGNOSIS — G35 Multiple sclerosis: Secondary | ICD-10-CM | POA: Diagnosis not present

## 2015-03-24 DIAGNOSIS — E1165 Type 2 diabetes mellitus with hyperglycemia: Secondary | ICD-10-CM | POA: Diagnosis not present

## 2015-03-24 DIAGNOSIS — E782 Mixed hyperlipidemia: Secondary | ICD-10-CM | POA: Diagnosis not present

## 2015-03-24 DIAGNOSIS — Z5181 Encounter for therapeutic drug level monitoring: Secondary | ICD-10-CM | POA: Diagnosis not present

## 2015-03-24 DIAGNOSIS — F328 Other depressive episodes: Secondary | ICD-10-CM | POA: Diagnosis not present

## 2015-03-24 DIAGNOSIS — M545 Low back pain: Secondary | ICD-10-CM | POA: Diagnosis not present

## 2015-04-19 ENCOUNTER — Encounter (HOSPITAL_COMMUNITY): Payer: Self-pay | Admitting: Emergency Medicine

## 2015-04-19 ENCOUNTER — Emergency Department (HOSPITAL_COMMUNITY)
Admission: EM | Admit: 2015-04-19 | Discharge: 2015-04-19 | Disposition: A | Discharge status: Home / Self Care | Payer: Medicare Other | Attending: Emergency Medicine | Admitting: Emergency Medicine

## 2015-04-19 ENCOUNTER — Emergency Department (HOSPITAL_COMMUNITY): Payer: Medicare Other

## 2015-04-19 DIAGNOSIS — S0181XA Laceration without foreign body of other part of head, initial encounter: Secondary | ICD-10-CM | POA: Diagnosis not present

## 2015-04-19 DIAGNOSIS — Z23 Encounter for immunization: Secondary | ICD-10-CM | POA: Insufficient documentation

## 2015-04-19 DIAGNOSIS — Z720 Tobacco use: Secondary | ICD-10-CM | POA: Insufficient documentation

## 2015-04-19 DIAGNOSIS — Y999 Unspecified external cause status: Secondary | ICD-10-CM | POA: Diagnosis not present

## 2015-04-19 DIAGNOSIS — W010XXA Fall on same level from slipping, tripping and stumbling without subsequent striking against object, initial encounter: Secondary | ICD-10-CM | POA: Insufficient documentation

## 2015-04-19 DIAGNOSIS — Z8669 Personal history of other diseases of the nervous system and sense organs: Secondary | ICD-10-CM | POA: Diagnosis not present

## 2015-04-19 DIAGNOSIS — Z79899 Other long term (current) drug therapy: Secondary | ICD-10-CM | POA: Insufficient documentation

## 2015-04-19 DIAGNOSIS — Y9289 Other specified places as the place of occurrence of the external cause: Secondary | ICD-10-CM | POA: Diagnosis not present

## 2015-04-19 DIAGNOSIS — T148 Other injury of unspecified body region: Secondary | ICD-10-CM | POA: Diagnosis not present

## 2015-04-19 DIAGNOSIS — W19XXXA Unspecified fall, initial encounter: Secondary | ICD-10-CM

## 2015-04-19 DIAGNOSIS — S0990XA Unspecified injury of head, initial encounter: Secondary | ICD-10-CM | POA: Diagnosis present

## 2015-04-19 DIAGNOSIS — S199XXA Unspecified injury of neck, initial encounter: Secondary | ICD-10-CM | POA: Diagnosis not present

## 2015-04-19 DIAGNOSIS — Y939 Activity, unspecified: Secondary | ICD-10-CM | POA: Diagnosis not present

## 2015-04-19 DIAGNOSIS — S098XXA Other specified injuries of head, initial encounter: Secondary | ICD-10-CM | POA: Diagnosis not present

## 2015-04-19 MED ORDER — NAPROXEN 500 MG PO TABS: 500.0000 mg | ORAL_TABLET | Freq: Two times a day (BID) | ORAL | Status: DC

## 2015-04-19 MED ORDER — NAPROXEN 500 MG PO TABS
500.0000 mg | ORAL_TABLET | Freq: Once | ORAL | Status: AC
  Administered 2015-04-19: 500 mg via ORAL
  Filled 2015-04-19: qty 1

## 2015-04-19 MED ORDER — LIDOCAINE-EPINEPHRINE (PF) 2 %-1:200000 IJ SOLN
10.0000 mL | Freq: Once | INTRAMUSCULAR | Status: AC
  Administered 2015-04-19: 10 mL
  Filled 2015-04-19: qty 20

## 2015-04-19 MED ORDER — TETANUS-DIPHTH-ACELL PERTUSSIS 5-2.5-18.5 LF-MCG/0.5 IM SUSP
0.5000 mL | Freq: Once | INTRAMUSCULAR | Status: AC
  Administered 2015-04-19: 0.5 mL via INTRAMUSCULAR
  Filled 2015-04-19: qty 0.5

## 2015-04-19 MED ORDER — BACITRACIN ZINC 500 UNIT/GM EX OINT: 1.0000 "application " | TOPICAL_OINTMENT | Freq: Two times a day (BID) | CUTANEOUS | Status: DC

## 2015-04-19 MED ORDER — BACITRACIN ZINC 500 UNIT/GM EX OINT
TOPICAL_OINTMENT | CUTANEOUS | Status: AC
  Filled 2015-04-19: qty 0.9

## 2015-04-19 MED ORDER — LIDOCAINE-EPINEPHRINE (PF) 2 %-1:200000 IJ SOLN: 10.0000 mL | Freq: Once | INTRAMUSCULAR | Status: DC
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
## 2015-04-19 NOTE — ED Notes (Signed)
Pt able to ambulate without assistance---- pt observed limping on right leg; pt states, "It's a chronic thing, my hip and leg always hurt when I walk".
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
## 2015-04-19 NOTE — Discharge Instructions (Signed)
Apply bacitracin to your wound twice a day to prevent infection. Keep your wound clean and dry. Return for suture removal in one week. Return sooner if symptoms worsen or signs of infection develop.  Facial Laceration  A facial laceration is a cut on the face. These injuries can be painful and cause bleeding. Lacerations usually heal quickly, but they need special care to reduce scarring. DIAGNOSIS  Your health care provider will take a medical history, ask for details about how the injury occurred, and examine the wound to determine how deep the cut is. TREATMENT  Some facial lacerations may not require closure. Others may not be able to be closed because of an increased risk of infection. The risk of infection and the chance for successful closure will depend on various factors, including the amount of time since the injury occurred. The wound may be cleaned to help prevent infection. If closure is appropriate, pain medicines may be given if needed. Your health care provider will use stitches (sutures), wound glue (adhesive), or skin adhesive strips to repair the laceration. These tools bring the skin edges together to allow for faster healing and a better cosmetic outcome. If needed, you may also be given a tetanus shot. HOME CARE INSTRUCTIONS  Only take over-the-counter or prescription medicines as directed by your health care provider.  Follow your health care provider's instructions for wound care. These instructions will vary depending on the technique used for closing the wound. For Sutures:  Keep the wound clean and dry.   If you were given a bandage (dressing), you should change it at least once a day. Also change the dressing if it becomes wet or dirty, or as directed by your health care provider.   Wash the wound with soap and water 2 times a day. Rinse the wound off with water to remove all soap. Pat the wound dry with a clean towel.   After cleaning, apply a thin layer of the  antibiotic ointment recommended by your health care provider. This will help prevent infection and keep the dressing from sticking.   You may shower as usual after the first 24 hours. Do not soak the wound in water until the sutures are removed.   Get your sutures removed as directed by your health care provider. With facial lacerations, sutures should usually be taken out after 4-5 days to avoid stitch marks.   Wait a few days after your sutures are removed before applying any makeup. For Skin Adhesive Strips:  Keep the wound clean and dry.   Do not get the skin adhesive strips wet. You may bathe carefully, using caution to keep the wound dry.   If the wound gets wet, pat it dry with a clean towel.   Skin adhesive strips will fall off on their own. You may trim the strips as the wound heals. Do not remove skin adhesive strips that are still stuck to the wound. They will fall off in time.  For Wound Adhesive:  You may briefly wet your wound in the shower or bath. Do not soak or scrub the wound. Do not swim. Avoid periods of heavy sweating until the skin adhesive has fallen off on its own. After showering or bathing, gently pat the wound dry with a clean towel.   Do not apply liquid medicine, cream medicine, ointment medicine, or makeup to your wound while the skin adhesive is in place. This may loosen the film before your wound is healed.   If  a dressing is placed over the wound, be careful not to apply tape directly over the skin adhesive. This may cause the adhesive to be pulled off before the wound is healed.   Avoid prolonged exposure to sunlight or tanning lamps while the skin adhesive is in place.  The skin adhesive will usually remain in place for 5-10 days, then naturally fall off the skin. Do not pick at the adhesive film.  After Healing: Once the wound has healed, cover the wound with sunscreen during the day for 1 full year. This can help minimize scarring. Exposure  to ultraviolet light in the first year will darken the scar. It can take 1-2 years for the scar to lose its redness and to heal completely.  SEEK IMMEDIATE MEDICAL CARE IF:  You have redness, pain, or swelling around the wound.   You see ayellowish-white fluid (pus) coming from the wound.   You have chills or a fever.  MAKE SURE YOU:  Understand these instructions.  Will watch your condition.  Will get help right away if you are not doing well or get worse. Document Released: 11/04/2004 Document Revised: 07/18/2013 Document Reviewed: 05/10/2013 [facility] Patient Information 2015 [place], [place]. This information is not intended to replace advice given to you by your health care provider. Make sure you discuss any questions you have with your health care provider.

## 2015-04-19 NOTE — ED Notes (Signed)
Pt's contact:  Barbara Cower (friend) ---- tel# [phone].

## 2015-04-21 DIAGNOSIS — F17210 Nicotine dependence, cigarettes, uncomplicated: Secondary | ICD-10-CM | POA: Diagnosis not present

## 2015-04-21 DIAGNOSIS — Z716 Tobacco abuse counseling: Secondary | ICD-10-CM | POA: Diagnosis not present

## 2015-04-21 DIAGNOSIS — J449 Chronic obstructive pulmonary disease, unspecified: Secondary | ICD-10-CM | POA: Diagnosis not present

## 2015-04-21 DIAGNOSIS — Z720 Tobacco use: Secondary | ICD-10-CM | POA: Diagnosis not present

## 2015-04-21 DIAGNOSIS — M545 Low back pain: Secondary | ICD-10-CM | POA: Diagnosis not present

## 2015-04-21 DIAGNOSIS — G35 Multiple sclerosis: Secondary | ICD-10-CM | POA: Diagnosis not present

## 2015-04-28 ENCOUNTER — Emergency Department (HOSPITAL_COMMUNITY)
Admission: EM | Admit: 2015-04-28 | Discharge: 2015-04-28 | Disposition: A | Discharge status: Home / Self Care | Payer: Medicare Other | Attending: Emergency Medicine | Admitting: Emergency Medicine

## 2015-04-28 ENCOUNTER — Encounter (HOSPITAL_COMMUNITY): Payer: Self-pay | Admitting: *Deleted

## 2015-04-28 DIAGNOSIS — Z8669 Personal history of other diseases of the nervous system and sense organs: Secondary | ICD-10-CM | POA: Insufficient documentation

## 2015-04-28 DIAGNOSIS — Z4802 Encounter for removal of sutures: Secondary | ICD-10-CM

## 2015-04-28 DIAGNOSIS — Z79899 Other long term (current) drug therapy: Secondary | ICD-10-CM | POA: Diagnosis not present

## 2015-04-28 DIAGNOSIS — Z791 Long term (current) use of non-steroidal anti-inflammatories (NSAID): Secondary | ICD-10-CM | POA: Insufficient documentation

## 2015-04-28 DIAGNOSIS — Z720 Tobacco use: Secondary | ICD-10-CM | POA: Diagnosis not present

## 2015-04-28 NOTE — Discharge Instructions (Signed)

## 2015-04-28 NOTE — ED Provider Notes (Signed)
CSN: [id number]     Arrival date & time 04/28/15  1458 History  This chart was scribed for non-physician practitioner, Teressa Lower, NP, working with Nelva Nay, MD, by Budd Palmer ED Scribe. This patient was seen in room TR08C/TR08C and the patient's care was started at 3:01 PM    No chief complaint on file.  The history is provided by the patient. No language interpreter was used.   HPI Comments: Gabriel Burns is a 44 y.o. male who presents to the Emergency Department to have his sutures placed on 7/9 above the right eye removed. He reports some itching around the suture site.   Past Medical History  Diagnosis Date  . Multiple sclerosis    No past surgical history on file. No family history on file. History  Substance Use Topics  . Smoking status: Current Every Day Smoker -- 0.75 packs/day    Types: Cigarettes  . Smokeless tobacco: Not on file  . Alcohol Use: Yes    Review of Systems  All other systems reviewed and are negative.   Allergies  Review of patient's allergies indicates no known allergies.  Home Medications   Prior to Admission medications   Medication Sig Start Date End Date Taking? Authorizing Provider  amphetamine-dextroamphetamine (ADDERALL) 20 MG tablet Take 20 mg by mouth 3 (three) times daily. 03/07/15   Historical Provider, MD  bacitracin ointment Apply 1 application topically 2 (two) times daily. 04/19/15   Antony Madura, PA-C  clonazePAM (KLONOPIN) 1 MG tablet Take 1 mg by mouth daily. 02/20/15   Historical Provider, MD  dronabinol (MARINOL) 5 MG capsule Take 5 mg by mouth 2 (two) times daily. 02/20/15   Historical Provider, MD  DULoxetine (CYMBALTA) 20 MG capsule Take 20 mg by mouth daily.    Historical Provider, MD  naproxen (NAPROSYN) 500 MG tablet Take 1 tablet (500 mg total) by mouth 2 (two) times daily. 04/19/15   Antony Madura, PA-C  oxybutynin (DITROPAN-XL) 5 MG 24 hr tablet Take 5 mg by mouth at bedtime.    Historical Provider, MD   There  were no vitals taken for this visit. Physical Exam  Constitutional: He is oriented to person, place, and time. He appears well-developed and well-nourished. No distress.  HENT:  Head: Normocephalic and atraumatic.  Mouth/Throat: Oropharynx is clear and moist.  Eyes: Conjunctivae are normal.  Neck: Normal range of motion.  Cardiovascular: Normal rate.   Pulmonary/Chest: Breath sounds normal. No respiratory distress.  Neurological: He is alert and oriented to person, place, and time.  Skin:  Well healing wound the right eyebrow  Psychiatric: He has a normal mood and affect. His behavior is normal.  Nursing note and vitals reviewed.   ED Course  SUTURE REMOVAL Date/Time: 04/28/2015 3:09 PM Performed by: Teressa Lower Authorized by: Teressa Lower Consent: Verbal consent obtained. Consent given by: patient Patient identity confirmed: verbally with patient Time out: Immediately prior to procedure a "time out" was called to verify the correct patient, procedure, equipment, support staff and site/side marked as required. Body area: head/neck Location details: right eyebrow Wound Appearance: clean Sutures Removed: 4 Facility: sutures placed in this facility Patient tolerance: Patient tolerated the procedure well with no immediate complications    DIAGNOSTIC STUDIES: Oxygen Saturation is 99% on RA, normal by my interpretation.    COORDINATION OF CARE: 3:06 PM - Sutures removed. Advised pt to stay out of the sun to reduce scarring. Pt advised of plan for treatment and pt [id number] Review  Labs Reviewed - No data to display  Imaging Review No results found.   EKG Interpretation None      MDM   Final diagnoses:  Visit for suture removal    Sutures removed:no problem  I personally performed the services described in this documentation, which was scribed in my presence. The recorded information has been reviewed and is accurate.   Teressa Lower,  NP 04/28/15 1511  Nelva Nay, MD 05/03/15 2225

## 2015-04-28 NOTE — ED Notes (Signed)
PT presents today for removal of sutures over RT eye.

## 2015-04-28 NOTE — ED Notes (Signed)
Declined W/C at D/C and was escorted to lobby by RN. 

## 2015-05-20 DIAGNOSIS — M545 Low back pain: Secondary | ICD-10-CM | POA: Diagnosis not present

## 2015-05-20 DIAGNOSIS — G35 Multiple sclerosis: Secondary | ICD-10-CM | POA: Diagnosis not present

## 2015-05-22 ENCOUNTER — Ambulatory Visit (INDEPENDENT_AMBULATORY_CARE_PROVIDER_SITE_OTHER): Payer: Medicare Other | Admitting: Family

## 2015-05-22 ENCOUNTER — Encounter: Payer: Self-pay | Admitting: Family

## 2015-05-22 VITALS — BP 128/86 | HR 94 | Temp 98.2°F | Resp 18 | Ht 69.5 in | Wt 170.0 lb

## 2015-05-22 DIAGNOSIS — Z720 Tobacco use: Secondary | ICD-10-CM | POA: Diagnosis not present

## 2015-05-22 DIAGNOSIS — G8929 Other chronic pain: Secondary | ICD-10-CM | POA: Diagnosis not present

## 2015-05-22 DIAGNOSIS — F17200 Nicotine dependence, unspecified, uncomplicated: Secondary | ICD-10-CM

## 2015-05-22 DIAGNOSIS — J449 Chronic obstructive pulmonary disease, unspecified: Secondary | ICD-10-CM

## 2015-05-22 DIAGNOSIS — G35 Multiple sclerosis: Secondary | ICD-10-CM

## 2015-05-22 MED ORDER — CLONAZEPAM 1 MG PO TABS: 1.0000 mg | ORAL_TABLET | Freq: Every day | ORAL | Status: DC

## 2015-05-22 MED ORDER — OXYCODONE HCL 10 MG PO TABS: 10.0000 mg | ORAL_TABLET | Freq: Four times a day (QID) | ORAL | Status: DC | PRN

## 2015-05-22 NOTE — Progress Notes (Signed)
Pre visit review using our clinic review tool, if applicable. No additional management support is needed unless otherwise documented below in the visit note. 

## 2015-05-22 NOTE — Assessment & Plan Note (Signed)
Previously diagnosed. Not currently maintained on any medications. Discussed importance of smoking cessation to halt any further lung damage that may be occurring. Continue to monitor at this time. Recommend pulmonary function testing. Unable to refer patient this time to pulmonology secondary to insurance.

## 2015-05-22 NOTE — Assessment & Plan Note (Signed)
Chronic pain treated with oxycodone. Unable to refer her to pain clinic secondary to insurance.  Advised patient to follow-up with [place] [facility] to determine who may refer him to a pain clinic for further management. Refill oxycodone 1 time patient as patient researching access to pain clinics. Refill clonazepam as needed for sleep.

## 2015-05-22 NOTE — Patient Instructions (Addendum)
Thank you for choosing [facility].  Summary/Instructions:  Your prescription(s) have been submitted to your pharmacy or been printed and provided for you. Please take as directed and contact our office if you believe you are having problem(s) with the medication(s) or have any questions.  If your symptoms worsen or fail to improve, please contact our office for further instruction, or in case of emergency go directly to the emergency room at the closest medical facility.    Smoking Cessation Quitting smoking is important to your health and has many advantages. However, it is not always easy to quit since nicotine is a very addictive drug. Oftentimes, people try 3 times or more before being able to quit. This document explains the best ways for you to prepare to quit smoking. Quitting takes hard work and a lot of effort, but you can do it. ADVANTAGES OF QUITTING SMOKING  You will live longer, feel better, and live better.  Your body will feel the impact of quitting smoking almost immediately.  Within 20 minutes, blood pressure decreases. Your pulse returns to its normal level.  After 8 hours, carbon monoxide levels in the blood return to normal. Your oxygen level increases.  After 24 hours, the chance of having a heart attack starts to decrease. Your breath, hair, and body stop smelling like smoke.  After 48 hours, damaged nerve endings begin to recover. Your sense of taste and smell improve.  After 72 hours, the body is virtually free of nicotine. Your bronchial tubes relax and breathing becomes easier.  After 2 to 12 weeks, lungs can hold more air. Exercise becomes easier and circulation improves.  The risk of having a heart attack, stroke, cancer, or lung disease is greatly reduced.  After 1 year, the risk of coronary heart disease is cut in half.  After 5 years, the risk of stroke falls to the same as a nonsmoker.  After 10 years, the risk of lung cancer is cut in half  and the risk of other cancers decreases significantly.  After 15 years, the risk of coronary heart disease drops, usually to the level of a nonsmoker.  If you are pregnant, quitting smoking will improve your chances of having a healthy baby.  The people you live with, especially any children, will be healthier.  You will have extra money to spend on things other than cigarettes. QUESTIONS TO THINK ABOUT BEFORE ATTEMPTING TO QUIT You may want to talk about your answers with your health care provider.  Why do you want to quit?  If you tried to quit in the past, what helped and what did not?  What will be the most difficult situations for you after you quit? How will you plan to handle them?  Who can help you through the tough times? Your family? Friends? A health care provider?  What pleasures do you get from smoking? What ways can you still get pleasure if you quit? Here are some questions to ask your health care provider:  How can you help me to be successful at quitting?  What medicine do you think would be best for me and how should I take it?  What should I do if I need more help?  What is smoking withdrawal like? How can I get information on withdrawal? GET READY  Set a quit date.  Change your environment by getting rid of all cigarettes, ashtrays, matches, and lighters in your home, car, or work. Do not let people smoke in your home.  Review your past attempts to quit. Think about what worked and what did not. GET SUPPORT AND ENCOURAGEMENT You have a better chance of being successful if you have help. You can get support in many ways.  Tell your family, friends, and coworkers that you are going to quit and need their support. Ask them not to smoke around you.  Get individual, group, or telephone counseling and support. Programs are available at [employer] and health centers. Call your local health department for information about programs in your area.  Spiritual  beliefs and practices may help some smokers quit.  Download a "quit meter" on your computer to keep track of quit statistics, such as how long you have gone without smoking, cigarettes not smoked, and money saved.  Get a self-help book about quitting smoking and staying off tobacco. LEARN NEW SKILLS AND BEHAVIORS  Distract yourself from urges to smoke. Talk to someone, go for a walk, or occupy your time with a task.  Change your normal routine. Take a different route to work. Drink tea instead of coffee. Eat breakfast in a different place.  Reduce your stress. Take a hot bath, exercise, or read a book.  Plan something enjoyable to do every day. Reward yourself for not smoking.  Explore interactive web-based programs that specialize in helping you quit. GET MEDICINE AND USE IT CORRECTLY Medicines can help you stop smoking and decrease the urge to smoke. Combining medicine with the above behavioral methods and support can greatly increase your chances of successfully quitting smoking.  Nicotine replacement therapy helps deliver nicotine to your body without the negative effects and risks of smoking. Nicotine replacement therapy includes nicotine gum, lozenges, inhalers, nasal sprays, and skin patches. Some may be available over-the-counter and others require a prescription.  Antidepressant medicine helps people abstain from smoking, but how this works is unknown. This medicine is available by prescription.  Nicotinic receptor partial agonist medicine simulates the effect of nicotine in your brain. This medicine is available by prescription. Ask your health care provider for advice about which medicines to use and how to use them based on your health history. Your health care provider will tell you what side effects to look out for if you choose to be on a medicine or therapy. Carefully read the information on the package. Do not use any other product containing nicotine while using a nicotine  replacement product.  RELAPSE OR DIFFICULT SITUATIONS Most relapses occur within the first 3 months after quitting. Do not be discouraged if you start smoking again. Remember, most people try several times before finally quitting. You may have symptoms of withdrawal because your body is used to nicotine. You may crave cigarettes, be irritable, feel very hungry, cough often, get headaches, or have difficulty concentrating. The withdrawal symptoms are only temporary. They are strongest when you first quit, but they will go away within 10-14 days. To reduce the chances of relapse, try to:  Avoid drinking alcohol. Drinking lowers your chances of successfully quitting.  Reduce the amount of caffeine you consume. Once you quit smoking, the amount of caffeine in your body increases and can give you symptoms, such as a rapid heartbeat, sweating, and anxiety.  Avoid smokers because they can make you want to smoke.  Do not let weight gain distract you. Many smokers will gain weight when they quit, usually less than 10 pounds. Eat a healthy diet and stay active. You can always lose the weight gained after you quit.  Find  ways to improve your mood other than smoking. FOR MORE INFORMATION  www.smokefree.gov  Document Released: 09/21/2001 Document Revised: 02/11/2014 Document Reviewed: 01/06/2012 [facility] Patient Information 2015 [place], [place]. This information is not intended to replace advice given to you by your health care provider. Make sure you discuss any questions you have with your health care provider.  Varenicline oral tablets What is this medicine? VARENICLINE (var EN i kleen) is used to help people quit smoking. It can reduce the symptoms caused by stopping smoking. It is used with a patient support program recommended by your physician. This medicine may be used for other purposes; ask your health care provider or pharmacist if you have questions. COMMON BRAND NAME(S): Chantix What should I  tell my health care provider before I take this medicine? They need to know if you have any of these conditions: -bipolar disorder, depression, schizophrenia or other mental illness -heart disease -if you often drink alcohol -kidney disease -peripheral vascular disease -seizures -stroke -suicidal thoughts, plans, or attempt; a previous suicide attempt by you or a family member -an unusual or allergic reaction to varenicline, other medicines, foods, dyes, or preservatives -pregnant or trying to get pregnant -breast-feeding How should I use this medicine? You should set a date to stop smoking and tell your doctor. Start this medicine one week before the quit date. You can also start taking this medicine before you choose a quit date, and then pick a quit date that is between 8 and 35 days of treatment with this medicine. Stick to your plan; ask about support groups or other ways to help you remain a 'quitter'. Take this medicine by mouth after eating. Take with a full glass of water. Follow the directions on the prescription label. Take your doses at regular intervals. Do not take your medicine more often than directed. A special MedGuide will be given to you by the pharmacist with each prescription and refill. Be sure to read this information carefully each time. Talk to your pediatrician regarding the use of this medicine in children. This medicine is not approved for use in children. Overdosage: If you think you have taken too much of this medicine contact a poison control center or emergency room at once. NOTE: This medicine is only for you. Do not share this medicine with others. What if I miss a dose? If you miss a dose, take it as soon as you can. If it is almost time for your next dose, take only that dose. Do not take double or extra doses. What may interact with this medicine? -alcohol or any product that contains alcohol -insulin -other stop smoking  aids -theophylline -warfarin This list may not describe all possible interactions. Give your health care provider a list of all the medicines, herbs, non-prescription drugs, or dietary supplements you use. Also tell them if you smoke, drink alcohol, or use illegal drugs. Some items may interact with your medicine. What should I watch for while using this medicine? Visit your doctor or health care professional for regular check ups. Ask for ongoing advice and encouragement from your doctor or healthcare professional, friends, and family to help you quit. If you smoke while on this medication, quit again Your mouth may get dry. Chewing sugarless gum or sucking hard candy, and drinking plenty of water may help. Contact your doctor if the problem does not go away or is severe. You may get drowsy or dizzy. Do not drive, use machinery, or do anything that needs mental alertness  until you know how this medicine affects you. Do not stand or sit up quickly, especially if you are an older patient. This reduces the risk of dizzy or fainting spells. The use of this medicine may increase the chance of suicidal thoughts or actions. Pay special attention to how you are responding while on this medicine. Any worsening of mood, or thoughts of suicide or dying should be reported to your health care professional right away. What side effects may I notice from receiving this medicine? Side effects that you should report to your doctor or health care professional as soon as possible: -allergic reactions like skin rash, itching or hives, swelling of the face, lips, tongue, or throat -breathing problems -changes in vision -chest pain or chest tightness -confusion, trouble speaking or understanding -fast, irregular heartbeat -feeling faint or lightheaded, falls -fever -pain in legs when walking -problems with balance, talking, walking -redness, blistering, peeling or loosening of the skin, including inside the  mouth -ringing in ears -seizures -sudden numbness or weakness of the face, arm or leg -suicidal thoughts or other mood changes -trouble passing urine or change in the amount of urine -unusual bleeding or bruising -unusually weak or tired Side effects that usually do not require medical attention (report to your doctor or health care professional if they continue or are bothersome): -constipation -headache -nausea, vomiting -strange dreams -stomach gas -trouble sleeping This list may not describe all possible side effects. Call your doctor for medical advice about side effects. You may report side effects to FDA at 1-800-FDA-1088. Where should I keep my medicine? Keep out of the reach of children. Store at room temperature between 15 and 30 degrees C (59 and 86 degrees F). Throw away any unused medicine after the expiration date. NOTE: This sheet is a summary. It may not cover all possible information. If you have questions about this medicine, talk to your doctor, pharmacist, or health care provider.  2015, Elsevier/Gold Standard. (2013-07-09 13:37:47)  Bupropion sustained-release tablets (smoking cessation) What is this medicine? BUPROPION (byoo PROE pee on) is used to help people quit smoking. This medicine may be used for other purposes; ask your health care provider or pharmacist if you have questions. COMMON BRAND NAME(S): Buproban, Zyban What should I tell my health care provider before I take this medicine? They need to know if you have any of these conditions: -an eating disorder, such as anorexia or bulimia -bipolar disorder or psychosis -diabetes or high blood sugar, treated with medication -glaucoma -head injury or brain tumor -heart disease, previous heart attack, or irregular heart beat -high blood pressure -kidney or liver disease -seizures -suicidal thoughts or a previous suicide attempt -Tourette's syndrome -weight loss -an unusual or allergic reaction to  bupropion, other medicines, foods, dyes, or preservatives -breast-feeding -pregnant or trying to become pregnant How should I use this medicine? Take this medicine by mouth with a glass of water. Follow the directions on the prescription label. You can take it with or without food. If it upsets your stomach, take it with food. Do not cut, crush or chew this medicine. Take your medicine at regular intervals. If you take this medicine more than once a day, take your second dose at least 8 hours after you take your first dose. To limit difficulty in sleeping, avoid taking this medicine at bedtime. Do not take your medicine more often than directed. Do not stop taking this medicine suddenly except upon the advice of your doctor. Stopping this medicine too quickly may  cause serious side effects. A special MedGuide will be given to you by the pharmacist with each prescription and refill. Be sure to read this information carefully each time. Talk to your pediatrician regarding the use of this medicine in children. Special care may be needed. Overdosage: If you think you have taken too much of this medicine contact a poison control center or emergency room at once. NOTE: This medicine is only for you. Do not share this medicine with others. What if I miss a dose? If you miss a dose, skip the missed dose and take your next tablet at the regular time. There should be at least 8 hours between doses. Do not take double or extra doses. What may interact with this medicine? Do not take this medicine with any of the following medications: -linezolid -MAOIs like Azilect, Carbex, Eldepryl, Marplan, Nardil, and Parnate -methylene blue (injected into a vein) -other medicines that contain bupropion like Wellbutrin This medicine may also interact with the following medications: -alcohol -certain medicines for anxiety or sleep -certain medicines for blood pressure like metoprolol, propranolol -certain medicines for  depression or psychotic disturbances -certain medicines for HIV or AIDS like efavirenz, lopinavir, nelfinavir, ritonavir -certain medicines for irregular heart beat like propafenone, flecainide -certain medicines for Parkinson's disease like amantadine, levodopa -certain medicines for seizures like carbamazepine, phenytoin, phenobarbital -cimetidine -clopidogrel -cyclophosphamide -furazolidone -isoniazid -nicotine -orphenadrine -procarbazine -steroid medicines like prednisone or cortisone -stimulant medicines for attention disorders, weight loss, or to stay awake -tamoxifen -theophylline -thiotepa -ticlopidine -tramadol -warfarin This list may not describe all possible interactions. Give your health care provider a list of all the medicines, herbs, non-prescription drugs, or dietary supplements you use. Also tell them if you smoke, drink alcohol, or use illegal drugs. Some items may interact with your medicine. What should I watch for while using this medicine? Visit your doctor or health care professional for regular checks on your progress. This medicine should be used together with a patient support program. It is important to participate in a behavioral program, counseling, or other support program that is recommended by your health care professional. Patients and their families should watch out for new or worsening thoughts of suicide or depression. Also watch out for sudden changes in feelings such as feeling anxious, agitated, panicky, irritable, hostile, aggressive, impulsive, severely restless, overly excited and hyperactive, or not being able to sleep. If this happens, especially at the beginning of treatment or after a change in dose, call your health care professional. Avoid alcoholic drinks while taking this medicine. Drinking excessive alcoholic beverages, using sleeping or anxiety medicines, or quickly stopping the use of these agents while taking this medicine may increase  your risk for a seizure. Do not drive or use heavy machinery until you know how this medicine affects you. This medicine can impair your ability to perform these tasks. Do not take this medicine close to bedtime. It may prevent you from sleeping. Your mouth may get dry. Chewing sugarless gum or sucking hard candy, and drinking plenty of water may help. Contact your doctor if the problem does not go away or is severe. Do not use nicotine patches or chewing gum without the advice of your doctor or health care professional while taking this medicine. You may need to have your blood pressure taken regularly if your doctor recommends that you use both nicotine and this medicine together. What side effects may I notice from receiving this medicine? Side effects that you should report to your doctor or  health care professional as soon as possible: -allergic reactions like skin rash, itching or hives, swelling of the face, lips, or tongue -breathing problems -changes in vision -confusion -fast or irregular heartbeat -hallucinations -increased blood pressure -redness, blistering, peeling or loosening of the skin, including inside the mouth -seizures -suicidal thoughts or other mood changes -unusually weak or tired -vomiting Side effects that usually do not require medical attention (report to your doctor or health care professional if they continue or are bothersome): -change in sex drive or performance -constipation -headache -loss of appetite -nausea -tremors -weight loss This list may not describe all possible side effects. Call your doctor for medical advice about side effects. You may report side effects to FDA at 1-800-FDA-1088. Where should I keep my medicine? Keep out of the reach of children. Store at room temperature between 20 and 25 degrees C (68 and 77 degrees F). Protect from light. Keep container tightly closed. Throw away any unused medicine after the expiration date. NOTE: This  sheet is a summary. It may not cover all possible information. If you have questions about this medicine, talk to your doctor, pharmacist, or health care provider.  2015, [employer]vier/[employer] Standard. (2013-05-25 10:55:10)

## 2015-05-22 NOTE — Assessment & Plan Note (Signed)
Currently managed by Dr. Loleta Chance of [facility]. Appears stable presently, however patient reports rapid amounts of decline. Continue current dosages of dronabinol and Adderall as prescribed. Follow up per neurology.

## 2015-05-22 NOTE — Assessment & Plan Note (Signed)
Indicates that he is wanting to quit, however is not ready to quit presently. Smoking cessation information provided. He will  Check with neurology regarding use of medication to assist with smoking cessation. Follow-up when ready.

## 2015-05-22 NOTE — Progress Notes (Signed)
Subjective:    Patient ID: Gabriel Burns, male    DOB: [birth year], 44 y.o.   MRN: [id number]  Chief Complaint  Patient presents with  . Establish Care    HPI:  Gabriel Burns is a 44 y.o. male with a PMH of multiple sclerosis, tobacco abuse who presents today for an office visit to establish care.   1.) Multiple Sclerosis - Previously diagnosed with multiple sclerosis about 5 years ago. Currently experiencing the associated symptom of difficulty walking and inability to pick his right leg up. Currently treated at [facility] by Dr. Loleta Chance.  2.) COPD - previously diagnosed with COPD by his former physician and instructed to quit smoking. Not currently taking any medications.   3.) Tobacco Cessation - Was recently attempting to quit with the assistance of the Nicoderm gum which was helping. Has a history of approximately 21.75 pack years. Recently ran out of the gum and has started smoking. Indicates that it helps with his anxiety.  No Known Allergies   Outpatient Prescriptions Prior to Visit  Medication Sig Dispense Refill  . amphetamine-dextroamphetamine (ADDERALL) 20 MG tablet Take 20 mg by mouth 3 (three) times daily.    . bacitracin ointment Apply 1 application topically 2 (two) times daily. 15 g 0  . dronabinol (MARINOL) 5 MG capsule Take 5 mg by mouth 2 (two) times daily.    Marland Kitchen oxybutynin (DITROPAN-XL) 5 MG 24 hr tablet Take 5 mg by mouth at bedtime.    . clonazePAM (KLONOPIN) 1 MG tablet Take 1 mg by mouth daily.    . DULoxetine (CYMBALTA) 20 MG capsule Take 20 mg by mouth daily.    . naproxen (NAPROSYN) 500 MG tablet Take 1 tablet (500 mg total) by mouth 2 (two) times daily. 30 tablet 0   No facility-administered medications prior to visit.     Past Medical History  Diagnosis Date  . Multiple sclerosis   . Depression   . COPD (chronic obstructive pulmonary disease)      History reviewed. No pertinent past surgical history.   Family History  Problem  Relation Age of Onset  . Hypertension Father   . Alcohol abuse Paternal Grandfather      Social History   Social History  . Marital Status: Single    Spouse Name: N/A  . Number of Children: 3  . Years of Education: GED   Occupational History  . Disability      MS   Social History Main Topics  . Smoking status: Former Smoker -- 0.75 packs/day for 29 years    Types: Cigarettes    Quit date: 04/23/2015  . Smokeless tobacco: Never Used  . Alcohol Use: 1.8 - 2.4 oz/week    3-4 Cans of beer per week  . Drug Use: No  . Sexual Activity: Not on file   Other Topics Concern  . Not on file   Social History Narrative   Fun: Draw (anything), realism.   Denies religious beliefs effecting health care.       Review of Systems  Constitutional: Negative for fever and chills.  Respiratory: Negative for cough, chest tightness, shortness of breath and wheezing.   Cardiovascular: Negative for chest pain, palpitations and leg swelling.  Neurological: Positive for weakness.      Objective:    BP 128/86 mmHg  Pulse 94  Temp(Src) 98.2 F (36.8 C) (Oral)  Resp 18  Ht 5' 9.5" (1.765 m)  Wt 170 lb (77.111 kg)  BMI 24.75  kg/m2  SpO2 99% Nursing note and vital signs reviewed.  Physical Exam  Constitutional: He is oriented to person, place, and time. He appears well-developed and well-nourished. No distress.  Cardiovascular: Normal rate, regular rhythm, normal heart sounds and intact distal pulses.   Pulmonary/Chest: Effort normal and breath sounds normal.  Neurological: He is alert and oriented to person, place, and time.  Skin: Skin is warm and dry.  Psychiatric: He has a normal mood and affect. His behavior is normal. Judgment and thought content normal.       Assessment & Plan:   Problem List Items Addressed This Visit      Respiratory   COPD (chronic obstructive pulmonary disease)     Previously diagnosed. Not currently maintained on any medications. Discussed importance  of smoking cessation to halt any further lung damage that may be occurring. Continue to monitor at this time. Recommend pulmonary function testing. Unable to refer patient this time to pulmonology secondary to insurance.        Nervous and Auditory   MS (multiple sclerosis) - Primary    Currently managed by Dr. Loleta Chance of [facility]. Appears stable presently, however patient reports rapid amounts of decline. Continue current dosages of dronabinol and Adderall as prescribed. Follow up per neurology.         Other   Tobacco use disorder    Indicates that he is wanting to quit, however is not ready to quit presently. Smoking cessation information provided. He will  Check with neurology regarding use of medication to assist with smoking cessation. Follow-up when ready.       Chronic pain     Chronic pain treated with oxycodone. Unable to refer her to pain clinic secondary to insurance.  Advised patient to follow-up with [place] access to determine who may refer him to a pain clinic for further management. Refill oxycodone 1 time patient as patient researching access to pain clinics. Refill clonazepam as needed for sleep.       Relevant Medications   clonazePAM (KLONOPIN) 1 MG tablet   Oxycodone HCl 10 MG TABS

## 2015-05-26 ENCOUNTER — Other Ambulatory Visit (HOSPITAL_COMMUNITY): Payer: Self-pay | Admitting: Chiropractic Medicine

## 2015-05-26 DIAGNOSIS — R200 Anesthesia of skin: Secondary | ICD-10-CM

## 2015-05-28 ENCOUNTER — Ambulatory Visit (HOSPITAL_COMMUNITY)
Admission: RE | Admit: 2015-05-28 | Discharge: 2015-05-28 | Disposition: A | Discharge status: Home / Self Care | Payer: Medicare Other | Source: Ambulatory Visit | Attending: Chiropractic Medicine | Admitting: Chiropractic Medicine

## 2015-05-28 DIAGNOSIS — G35 Multiple sclerosis: Secondary | ICD-10-CM | POA: Insufficient documentation

## 2015-05-28 DIAGNOSIS — R200 Anesthesia of skin: Secondary | ICD-10-CM

## 2015-05-28 DIAGNOSIS — M5126 Other intervertebral disc displacement, lumbar region: Secondary | ICD-10-CM | POA: Diagnosis not present

## 2015-05-28 DIAGNOSIS — M4806 Spinal stenosis, lumbar region: Secondary | ICD-10-CM | POA: Insufficient documentation

## 2015-05-28 DIAGNOSIS — M545 Low back pain: Secondary | ICD-10-CM | POA: Diagnosis present

## 2015-06-06 DIAGNOSIS — B182 Chronic viral hepatitis C: Secondary | ICD-10-CM | POA: Diagnosis not present

## 2015-06-06 DIAGNOSIS — G35 Multiple sclerosis: Secondary | ICD-10-CM | POA: Diagnosis not present

## 2015-06-06 DIAGNOSIS — R531 Weakness: Secondary | ICD-10-CM | POA: Diagnosis not present

## 2015-06-06 DIAGNOSIS — G3184 Mild cognitive impairment, so stated: Secondary | ICD-10-CM | POA: Diagnosis not present

## 2015-06-10 ENCOUNTER — Telehealth: Payer: Self-pay | Admitting: Family

## 2015-06-10 NOTE — Telephone Encounter (Signed)
Patient is needing to be referred to pain clinic and he doesn't know how to go about it. Can you please call him at [phone] He does have medicaid - [place] access so I don't think we would be able to refer him so he may just need help getting to a pain clinic

## 2015-06-17 NOTE — Telephone Encounter (Signed)
Called both of pts numbers and was not able to get in touch with him. LVM letting him know we can not do referral it has to be from PCP that is listed on his insurance card.

## 2015-06-23 ENCOUNTER — Ambulatory Visit (INDEPENDENT_AMBULATORY_CARE_PROVIDER_SITE_OTHER): Payer: Medicare Other | Admitting: Family

## 2015-06-23 ENCOUNTER — Encounter: Payer: Self-pay | Admitting: Family

## 2015-06-23 VITALS — BP 124/88 | HR 98 | Temp 98.4°F | Resp 18 | Ht 69.5 in | Wt 168.0 lb

## 2015-06-23 DIAGNOSIS — G35 Multiple sclerosis: Secondary | ICD-10-CM | POA: Diagnosis not present

## 2015-06-23 DIAGNOSIS — G35D Multiple sclerosis, unspecified: Secondary | ICD-10-CM

## 2015-06-23 DIAGNOSIS — G8929 Other chronic pain: Secondary | ICD-10-CM

## 2015-06-23 DIAGNOSIS — J449 Chronic obstructive pulmonary disease, unspecified: Secondary | ICD-10-CM | POA: Diagnosis not present

## 2015-06-23 MED ORDER — OXYCODONE HCL 10 MG PO TABS: 10.0000 mg | ORAL_TABLET | Freq: Four times a day (QID) | ORAL | Status: DC | PRN

## 2015-06-23 NOTE — Progress Notes (Signed)
Pre visit review using our clinic review tool, if applicable. No additional management support is needed unless otherwise documented below in the visit note. 

## 2015-06-23 NOTE — Progress Notes (Signed)
Subjective:    Patient ID: Gabriel Burns, male    DOB: 10-03-1971, 44 y.o.   MRN: [id number]  Chief Complaint  Patient presents with  . Follow-up    referral neuro surgeon    HPI:  Gabriel Burns is a 44 y.o. male with a PMH of multiple scleroiss, COPD, and chronic pain who presents today for a follow up office visit.   1.) Multiple Sclerosis - Currently managed by Dr. Jacquelyne Balint of [facility] which has been stable with no declines since last office visit. Requesting referral to neurology closer to [facility].    2.) COPD - Stable present with no medications, however he continues to smoke. Indicates that he knows he needs to quit smoking but he has a lot of things working against him in order to do it.  3.) Chronic pain - Currently managed with oxycodone and clonazepam for anxiety. Previous referral was not permitted secondary to insurance purposes. Indicates that his insurance has changed and should be permitted to be referred to pain management. Takes the medication as prescribed and denies adverse side effects. Reports that he is able to function with the medication and reports no constipation.   No Known Allergies   Current Outpatient Prescriptions on File Prior to Visit  Medication Sig Dispense Refill  . amphetamine-dextroamphetamine (ADDERALL) 20 MG tablet Take 20 mg by mouth 3 (three) times daily.    . bacitracin ointment Apply 1 application topically 2 (two) times daily. 15 g 0  . clonazePAM (KLONOPIN) 1 MG tablet Take 1 tablet (1 mg total) by mouth daily. 30 tablet 0  . dronabinol (MARINOL) 5 MG capsule Take 5 mg by mouth 2 (two) times daily.    Marland Kitchen oxybutynin (DITROPAN-XL) 5 MG 24 hr tablet Take 5 mg by mouth at bedtime.     No current facility-administered medications on file prior to visit.    Past Medical History  Diagnosis Date  . Multiple sclerosis   . Depression   . COPD (chronic obstructive pulmonary disease)      Review of Systems  Constitutional:  Negative for fever and chills.  Respiratory: Negative for chest tightness and shortness of breath.   Cardiovascular: Negative for chest pain, palpitations and leg swelling.      Objective:    BP 124/88 mmHg  Pulse 98  Temp(Src) 98.4 F (36.9 C) (Oral)  Resp 18  Ht 5' 9.5" (1.765 m)  Wt 168 lb (76.204 kg)  BMI 24.46 kg/m2  SpO2 97% Nursing note and vital signs reviewed.  Physical Exam  Constitutional: He is oriented to person, place, and time. He appears well-developed and well-nourished. No distress.  Cardiovascular: Normal rate, regular rhythm, normal heart sounds and intact distal pulses.   Pulmonary/Chest: Effort normal and breath sounds normal.  Neurological: He is alert and oriented to person, place, and time.  Skin: Skin is warm and dry.  Psychiatric: He has a normal mood and affect. His behavior is normal. Judgment and thought content normal.       Assessment & Plan:   Problem List Items Addressed This Visit      Respiratory   COPD (chronic obstructive pulmonary disease)    Stable with no medications at current. Discussed importance decreasing tobacco use and effects on cardiovascular and respiratory disease. Not ready to quit at this time secondary to lifestyle challenges and stressors that he has. Expressed potential mechanisms for quitting. Follow up when ready and continue current management of COPD without medication.  Relevant Orders   Ambulatory referral to Pain Clinic     Nervous and Auditory   MS (multiple sclerosis) - Primary    Stable and has not experienced any exacerbations since previous visit. Requesting new referral to neurology to move all of his appointments to [facility]. Refer to neurology for management of multiple sclerosis.       Relevant Orders   Ambulatory referral to Neurology     Other   Chronic pain    Discussed pain control and need for referral to pain clinic indicating that this provider will give 1 additional fill of  medication as patient awaits entry to a local pain clinic. Continue current dosage of oxycodone. Refer to pain management for alternative options.       Relevant Medications   Oxycodone HCl 10 MG TABS   Other Relevant Orders   Ambulatory referral to Pain Clinic

## 2015-06-23 NOTE — Assessment & Plan Note (Signed)
Stable with no medications at current. Discussed importance decreasing tobacco use and effects on cardiovascular and respiratory disease. Not ready to quit at this time secondary to lifestyle challenges and stressors that he has. Expressed potential mechanisms for quitting. Follow up when ready and continue current management of COPD without medication.

## 2015-06-23 NOTE — Patient Instructions (Signed)
Thank you for choosing [facility].  Summary/Instructions:  Please continue to take your medications as prescribed.  Referrals have been made during this visit. You should expect to hear back from our schedulers in about 7-10 days in regards to establishing an appointment with the specialists we discussed.   If your symptoms worsen or fail to improve, please contact our office for further instruction, or in case of emergency go directly to the emergency room at the closest medical facility.

## 2015-06-23 NOTE — Assessment & Plan Note (Signed)
Stable and has not experienced any exacerbations since previous visit. Requesting new referral to neurology to move all of his appointments to [place]. Refer to neurology for management of multiple sclerosis.

## 2015-06-23 NOTE — Assessment & Plan Note (Signed)
Discussed pain control and need for referral to pain clinic indicating that this provider will give 1 additional fill of medication as patient awaits entry to a local pain clinic. Continue current dosage of oxycodone. Refer to pain management for alternative options.

## 2015-06-24 ENCOUNTER — Other Ambulatory Visit: Payer: Self-pay

## 2015-06-24 MED ORDER — OXYBUTYNIN CHLORIDE ER 5 MG PO TB24: 5.0000 mg | ORAL_TABLET | Freq: Every day | ORAL | Status: DC

## 2015-07-19 DIAGNOSIS — M5416 Radiculopathy, lumbar region: Secondary | ICD-10-CM | POA: Diagnosis not present

## 2015-07-23 ENCOUNTER — Encounter: Payer: Self-pay | Admitting: Family

## 2015-07-23 ENCOUNTER — Ambulatory Visit (INDEPENDENT_AMBULATORY_CARE_PROVIDER_SITE_OTHER): Payer: Medicare Other | Admitting: Family

## 2015-07-23 VITALS — BP 140/94 | HR 91 | Temp 98.0°F | Resp 18 | Ht 69.0 in | Wt 168.0 lb

## 2015-07-23 DIAGNOSIS — Z716 Tobacco abuse counseling: Secondary | ICD-10-CM | POA: Diagnosis not present

## 2015-07-23 DIAGNOSIS — M5416 Radiculopathy, lumbar region: Secondary | ICD-10-CM | POA: Diagnosis not present

## 2015-07-23 DIAGNOSIS — G8929 Other chronic pain: Secondary | ICD-10-CM | POA: Diagnosis not present

## 2015-07-23 MED ORDER — SILDENAFIL CITRATE 100 MG PO TABS: 50.0000 mg | ORAL_TABLET | Freq: Every day | ORAL | Status: DC | PRN

## 2015-07-23 MED ORDER — OXYCODONE HCL 10 MG PO TABS: 10.0000 mg | ORAL_TABLET | Freq: Four times a day (QID) | ORAL | Status: DC | PRN

## 2015-07-23 NOTE — Progress Notes (Signed)
Pre visit review using our clinic review tool, if applicable. No additional management support is needed unless otherwise documented below in the visit note. 

## 2015-07-23 NOTE — Progress Notes (Signed)
   Subjective:    Patient ID: Gabriel Burns, male    DOB: 1971-06-20, 44 y.o.   MRN: [id number]  Chief Complaint  Patient presents with  . Follow-up    Refill of pain medication    HPI:  Gabriel Burns is a 44 y.o. male who  has a past medical history of Multiple sclerosis (HCC); Depression; and COPD (chronic obstructive pulmonary disease) (HCC). and presents today for a follow up office visit.   1.)  Chronic pain - Currently maintained on oxycodone. Takes the medication as prescribed and denies adverse side effects. Notes that the oxycodone does not really touch his pain. Continues to experience the associated symptom of pain located in his legs and back. His back is currently being treated by orthopedics and will be receiving injections. Does have decreased ability to sleep at night. Able to ambulate with the assistance with a cane. States that his life sucks and that it is what it is. Was previously referred to the pain clinic and the referral is currently in process.   No Known Allergies   Current Outpatient Prescriptions on File Prior to Visit  Medication Sig Dispense Refill  . amphetamine-dextroamphetamine (ADDERALL) 20 MG tablet Take 20 mg by mouth 3 (three) times daily.    . bacitracin ointment Apply 1 application topically 2 (two) times daily. 15 g 0  . clonazePAM (KLONOPIN) 1 MG tablet Take 1 tablet (1 mg total) by mouth daily. 30 tablet 0  . dronabinol (MARINOL) 5 MG capsule Take 5 mg by mouth 2 (two) times daily.    Marland Kitchen oxybutynin (DITROPAN-XL) 5 MG 24 hr tablet Take 1 tablet (5 mg total) by mouth at bedtime. 30 tablet 2   No current facility-administered medications on file prior to visit.    No past surgical history on file.   Review of Systems  Constitutional: Negative for fever and chills.  Gastrointestinal: Negative for constipation.  Musculoskeletal: Positive for back pain, arthralgias and neck pain.      Objective:    BP 140/94 mmHg  Pulse 91   Temp(Src) 98 F (36.7 C) (Oral)  Resp 18  Ht  (1.753 m)  Wt 168 lb (76.204 kg)  BMI 24.80 kg/m2  SpO2 97% Nursing note and vital signs reviewed.  Physical Exam  Constitutional: He is oriented to person, place, and time. He appears well-developed and well-nourished. No distress.  Cardiovascular: Normal rate, regular rhythm, normal heart sounds and intact distal pulses.   Pulmonary/Chest: Effort normal and breath sounds normal.  Neurological: He is alert and oriented to person, place, and time.  Skin: Skin is warm and dry.  Psychiatric: He has a normal mood and affect. His behavior is normal. Judgment and thought content normal.       Assessment & Plan:   Problem List Items Addressed This Visit      Other   Chronic pain - Primary    Continues to await referral to pain clinic. Pain is minimally controlled with current regimen of oxycodone, however will be receiving back injections from orthopedics. Other most likely related to MS for which he will be working on seeing neurology. Denies constipation. Continue current dose of oxycodone. Continue to await referral to pain clinic.       Relevant Medications   Oxycodone HCl 10 MG TABS

## 2015-07-23 NOTE — Assessment & Plan Note (Addendum)
Continues to await referral to pain clinic. Pain is minimally controlled with current regimen of oxycodone, however will be receiving back injections from orthopedics. Other most likely related to MS for which he will be working on seeing neurology. Denies constipation. Continue current dose of oxycodone. Continue to await referral to pain clinic. [place] Controlled Substance Database reviewed with no irregularities noted.

## 2015-07-23 NOTE — Patient Instructions (Signed)
Thank you for choosing [facility].  Summary/Instructions:  Your prescription(s) have been submitted to your pharmacy or been printed and provided for you. Please take as directed and contact our office if you believe you are having problem(s) with the medication(s) or have any questions.  If your symptoms worsen or fail to improve, please contact our office for further instruction, or in case of emergency go directly to the emergency room at the closest medical facility.     

## 2015-07-30 ENCOUNTER — Telehealth: Payer: Self-pay | Admitting: *Deleted

## 2015-07-30 NOTE — Telephone Encounter (Signed)
Pt left msg on triage couldn't understand msg. Phone was going in & out and he was mumbling. Tried calling pt back x's 2 no answer & can't leave msg due to vm not set-up...Raechel Chute

## 2015-07-31 NOTE — Telephone Encounter (Signed)
Tried calling pt again still no answer & vm not set-up can't leave msg...Gabriel Burns

## 2015-08-01 ENCOUNTER — Telehealth: Payer: Self-pay | Admitting: Family

## 2015-08-01 NOTE — Telephone Encounter (Signed)
Pt called in said that he is hurting all the time and wanted to talk to nurse to see what can be done to help him?  Can you call him when you get a chance?

## 2015-08-01 NOTE — Telephone Encounter (Signed)
Called pt again still no answer. Pt never return call closing encounter...Raechel Chute

## 2015-08-05 ENCOUNTER — Telehealth: Payer: Self-pay | Admitting: Family

## 2015-08-05 NOTE — Telephone Encounter (Signed)
Patient is requesting sildenafil and oxybutynin to be sent over to [facility] on [address]

## 2015-08-06 MED ORDER — SILDENAFIL CITRATE 20 MG PO TABS: ORAL_TABLET | ORAL | Status: DC

## 2015-08-06 MED ORDER — OXYBUTYNIN CHLORIDE ER 5 MG PO TB24: 5.0000 mg | ORAL_TABLET | Freq: Every day | ORAL | Status: DC

## 2015-08-06 NOTE — Telephone Encounter (Signed)
Rxs sent

## 2015-08-07 NOTE — Telephone Encounter (Signed)
Patient states he does not want to use [facility] on [address].  He wantes the scripts sent to [facility].  Can you please resend.

## 2015-08-08 ENCOUNTER — Telehealth: Payer: Self-pay | Admitting: Family

## 2015-08-08 MED ORDER — SILDENAFIL CITRATE 20 MG PO TABS: ORAL_TABLET | ORAL | Status: DC

## 2015-08-08 MED ORDER — OXYBUTYNIN CHLORIDE ER 5 MG PO TB24: 5.0000 mg | ORAL_TABLET | Freq: Every day | ORAL | Status: DC

## 2015-08-08 NOTE — Telephone Encounter (Signed)
Rx sent 

## 2015-08-08 NOTE — Addendum Note (Signed)
Addended by: Deatra James on: 08/08/2015 12:08 PM   Modules accepted: Orders

## 2015-08-08 NOTE — Telephone Encounter (Signed)
Pt requesting the prescription for sildenafil (REVATIO) 20 MG tablet [[id number] be sent to [facility]. He states he would like all medications to go [facility]

## 2015-08-08 NOTE — Telephone Encounter (Signed)
[facility]ent to [facility]

## 2015-08-11 NOTE — Telephone Encounter (Deleted)
Please inform him that I do not treat chronic pain.

## 2015-08-25 ENCOUNTER — Ambulatory Visit: Payer: Medicare Other | Admitting: Family

## 2015-08-25 DIAGNOSIS — Z0289 Encounter for other administrative examinations: Secondary | ICD-10-CM

## 2015-08-29 DIAGNOSIS — M5136 Other intervertebral disc degeneration, lumbar region: Secondary | ICD-10-CM | POA: Diagnosis not present

## 2015-08-29 DIAGNOSIS — G4700 Insomnia, unspecified: Secondary | ICD-10-CM | POA: Diagnosis not present

## 2015-08-29 DIAGNOSIS — M25561 Pain in right knee: Secondary | ICD-10-CM | POA: Diagnosis not present

## 2015-08-29 DIAGNOSIS — G35 Multiple sclerosis: Secondary | ICD-10-CM | POA: Diagnosis not present

## 2015-08-29 DIAGNOSIS — F909 Attention-deficit hyperactivity disorder, unspecified type: Secondary | ICD-10-CM | POA: Diagnosis not present

## 2015-08-29 DIAGNOSIS — G894 Chronic pain syndrome: Secondary | ICD-10-CM | POA: Diagnosis not present

## 2015-08-29 DIAGNOSIS — M545 Low back pain: Secondary | ICD-10-CM | POA: Diagnosis not present

## 2015-09-09 ENCOUNTER — Encounter: Payer: Self-pay | Admitting: Family

## 2015-09-25 ENCOUNTER — Telehealth: Payer: Self-pay

## 2015-09-25 NOTE — Telephone Encounter (Signed)
Called pt regarding flu vaccination - No answer, no VM

## 2015-11-12 DIAGNOSIS — G4700 Insomnia, unspecified: Secondary | ICD-10-CM | POA: Diagnosis not present

## 2015-11-12 DIAGNOSIS — M545 Low back pain: Secondary | ICD-10-CM | POA: Diagnosis not present

## 2015-11-12 DIAGNOSIS — F909 Attention-deficit hyperactivity disorder, unspecified type: Secondary | ICD-10-CM | POA: Diagnosis not present

## 2015-11-12 DIAGNOSIS — M5136 Other intervertebral disc degeneration, lumbar region: Secondary | ICD-10-CM | POA: Diagnosis not present

## 2015-11-12 DIAGNOSIS — G894 Chronic pain syndrome: Secondary | ICD-10-CM | POA: Diagnosis not present

## 2015-11-12 DIAGNOSIS — M25561 Pain in right knee: Secondary | ICD-10-CM | POA: Diagnosis not present

## 2015-11-12 DIAGNOSIS — F419 Anxiety disorder, unspecified: Secondary | ICD-10-CM | POA: Diagnosis not present

## 2015-11-12 DIAGNOSIS — F341 Dysthymic disorder: Secondary | ICD-10-CM | POA: Diagnosis not present

## 2015-11-18 DIAGNOSIS — M25461 Effusion, right knee: Secondary | ICD-10-CM | POA: Diagnosis not present

## 2015-11-18 DIAGNOSIS — M21371 Foot drop, right foot: Secondary | ICD-10-CM | POA: Diagnosis not present

## 2015-11-18 DIAGNOSIS — M25569 Pain in unspecified knee: Secondary | ICD-10-CM | POA: Diagnosis not present

## 2015-11-18 DIAGNOSIS — M62551 Muscle wasting and atrophy, not elsewhere classified, right thigh: Secondary | ICD-10-CM | POA: Diagnosis not present

## 2015-11-18 DIAGNOSIS — M25561 Pain in right knee: Secondary | ICD-10-CM | POA: Diagnosis not present

## 2015-11-18 DIAGNOSIS — W010XXA Fall on same level from slipping, tripping and stumbling without subsequent striking against object, initial encounter: Secondary | ICD-10-CM | POA: Diagnosis not present

## 2015-11-18 DIAGNOSIS — S83511A Sprain of anterior cruciate ligament of right knee, initial encounter: Secondary | ICD-10-CM | POA: Diagnosis not present

## 2015-11-18 DIAGNOSIS — Y9301 Activity, walking, marching and hiking: Secondary | ICD-10-CM | POA: Diagnosis not present

## 2015-12-03 DIAGNOSIS — M21371 Foot drop, right foot: Secondary | ICD-10-CM | POA: Diagnosis not present

## 2015-12-03 DIAGNOSIS — M25561 Pain in right knee: Secondary | ICD-10-CM | POA: Diagnosis not present

## 2015-12-03 DIAGNOSIS — Z5189 Encounter for other specified aftercare: Secondary | ICD-10-CM | POA: Diagnosis not present

## 2015-12-03 DIAGNOSIS — M25469 Effusion, unspecified knee: Secondary | ICD-10-CM | POA: Diagnosis not present

## 2015-12-03 DIAGNOSIS — S83511A Sprain of anterior cruciate ligament of right knee, initial encounter: Secondary | ICD-10-CM | POA: Diagnosis not present

## 2015-12-03 DIAGNOSIS — R262 Difficulty in walking, not elsewhere classified: Secondary | ICD-10-CM | POA: Diagnosis not present

## 2015-12-16 DIAGNOSIS — M21379 Foot drop, unspecified foot: Secondary | ICD-10-CM | POA: Diagnosis not present

## 2015-12-16 DIAGNOSIS — R2689 Other abnormalities of gait and mobility: Secondary | ICD-10-CM | POA: Diagnosis not present

## 2015-12-16 DIAGNOSIS — M6281 Muscle weakness (generalized): Secondary | ICD-10-CM | POA: Diagnosis not present

## 2015-12-16 DIAGNOSIS — F329 Major depressive disorder, single episode, unspecified: Secondary | ICD-10-CM | POA: Diagnosis not present

## 2015-12-16 DIAGNOSIS — M545 Low back pain: Secondary | ICD-10-CM | POA: Diagnosis not present

## 2015-12-16 DIAGNOSIS — G8111 Spastic hemiplegia affecting right dominant side: Secondary | ICD-10-CM | POA: Diagnosis not present

## 2015-12-16 DIAGNOSIS — G35 Multiple sclerosis: Secondary | ICD-10-CM | POA: Diagnosis not present

## 2015-12-17 DIAGNOSIS — R262 Difficulty in walking, not elsewhere classified: Secondary | ICD-10-CM | POA: Diagnosis not present

## 2015-12-17 DIAGNOSIS — M25561 Pain in right knee: Secondary | ICD-10-CM | POA: Diagnosis not present

## 2015-12-17 DIAGNOSIS — Z5189 Encounter for other specified aftercare: Secondary | ICD-10-CM | POA: Diagnosis not present

## 2015-12-17 DIAGNOSIS — S83511A Sprain of anterior cruciate ligament of right knee, initial encounter: Secondary | ICD-10-CM | POA: Diagnosis not present

## 2015-12-17 DIAGNOSIS — M21371 Foot drop, right foot: Secondary | ICD-10-CM | POA: Diagnosis not present

## 2015-12-17 DIAGNOSIS — M25469 Effusion, unspecified knee: Secondary | ICD-10-CM | POA: Diagnosis not present

## 2015-12-17 DIAGNOSIS — G35 Multiple sclerosis: Secondary | ICD-10-CM | POA: Diagnosis not present

## 2015-12-19 DIAGNOSIS — F909 Attention-deficit hyperactivity disorder, unspecified type: Secondary | ICD-10-CM | POA: Diagnosis not present

## 2015-12-19 DIAGNOSIS — G4700 Insomnia, unspecified: Secondary | ICD-10-CM | POA: Diagnosis not present

## 2015-12-19 DIAGNOSIS — G894 Chronic pain syndrome: Secondary | ICD-10-CM | POA: Diagnosis not present

## 2015-12-19 DIAGNOSIS — F419 Anxiety disorder, unspecified: Secondary | ICD-10-CM | POA: Diagnosis not present

## 2015-12-19 DIAGNOSIS — F5221 Male erectile disorder: Secondary | ICD-10-CM | POA: Diagnosis not present

## 2015-12-19 DIAGNOSIS — E299 Testicular dysfunction, unspecified: Secondary | ICD-10-CM | POA: Diagnosis not present

## 2015-12-19 DIAGNOSIS — G35 Multiple sclerosis: Secondary | ICD-10-CM | POA: Diagnosis not present

## 2015-12-19 DIAGNOSIS — F341 Dysthymic disorder: Secondary | ICD-10-CM | POA: Diagnosis not present

## 2015-12-30 DIAGNOSIS — A088 Other specified intestinal infections: Secondary | ICD-10-CM | POA: Diagnosis not present

## 2016-01-08 ENCOUNTER — Ambulatory Visit (INDEPENDENT_AMBULATORY_CARE_PROVIDER_SITE_OTHER): Payer: Medicare Other | Admitting: Family

## 2016-01-08 ENCOUNTER — Encounter: Payer: Self-pay | Admitting: Family

## 2016-01-08 VITALS — BP 144/80 | HR 116 | Resp 16 | Ht 69.0 in | Wt 165.0 lb

## 2016-01-08 DIAGNOSIS — F329 Major depressive disorder, single episode, unspecified: Secondary | ICD-10-CM

## 2016-01-08 DIAGNOSIS — G35 Multiple sclerosis: Secondary | ICD-10-CM

## 2016-01-08 DIAGNOSIS — F32A Depression, unspecified: Secondary | ICD-10-CM | POA: Insufficient documentation

## 2016-01-08 DIAGNOSIS — G8929 Other chronic pain: Secondary | ICD-10-CM

## 2016-01-08 DIAGNOSIS — J449 Chronic obstructive pulmonary disease, unspecified: Secondary | ICD-10-CM

## 2016-01-08 DIAGNOSIS — F988 Other specified behavioral and emotional disorders with onset usually occurring in childhood and adolescence: Secondary | ICD-10-CM | POA: Insufficient documentation

## 2016-01-08 DIAGNOSIS — F909 Attention-deficit hyperactivity disorder, unspecified type: Secondary | ICD-10-CM

## 2016-01-08 MED ORDER — OXYCODONE HCL 10 MG PO TABS: 10.0000 mg | ORAL_TABLET | Freq: Two times a day (BID) | ORAL | Status: DC

## 2016-01-08 MED ORDER — SILDENAFIL CITRATE 100 MG PO TABS: 50.0000 mg | ORAL_TABLET | Freq: Every day | ORAL | Status: DC | PRN

## 2016-01-08 MED ORDER — DULOXETINE HCL 60 MG PO CPEP: 60.0000 mg | ORAL_CAPSULE | Freq: Every day | ORAL | Status: DC

## 2016-01-08 MED ORDER — AMPHETAMINE-DEXTROAMPHETAMINE 20 MG PO TABS: 20.0000 mg | ORAL_TABLET | Freq: Two times a day (BID) | ORAL | Status: DC

## 2016-01-08 NOTE — Progress Notes (Signed)
Pre visit review using our clinic review tool, if applicable. No additional management support is needed unless otherwise documented below in the visit note. 

## 2016-01-08 NOTE — Progress Notes (Signed)
Subjective:    Patient ID: Gabriel Burns, male    DOB: [birth year], 45 y.o.   MRN: [id number]  Chief Complaint  Patient presents with  . Multiple Sclerosis  . COPD  . Pain    HPI:  Gabriel Burns is a 45 y.o. male who  has a past medical history of Multiple sclerosis (HCC); Depression; and COPD (chronic obstructive pulmonary disease) (HCC). and presents today for a follow up office visit.   1.) COPD - Not currently managed on medication. Since his last visit he has quit smoking. Denies any shortness of breath but he does have    2.) Depression - Previously diagnosed with depression and started on Cymbalta. Reports taking the medication as prescribed and notes that he recently quit taking it because he felt like he was taking too much medication. Not sure if it really helped, but would like to get restarted. Denies suicidal ideation.   3.) Multiple Sclerosis - Experiencing the associated symptoms of decreased function located on his right side has been going on and progressively worsening over the past several months. Has fallen several times over the past couple of weeks secondary to his decreased mobility symptoms. Not currently under the care of neurology and would like to get established. He was also maintained on Adderall for decreased energy and was prescribed by neurology.   4.) Chronic pain - Continues to experience the associated symptom of chronic pain located in his right side and multiple joints. Previously maintained on Oxycodone and has not taken it in several months. He has also been started on Cymbalta for depression and pain management. He was previously referred to the pain clinic and was unavailable when the pain clinic attempted to contact him.    No Known Allergies   Current Outpatient Prescriptions on File Prior to Visit  Medication Sig Dispense Refill  . bacitracin ointment Apply 1 application topically 2 (two) times daily. 15 g 0  . oxybutynin (DITROPAN-XL)  5 MG 24 hr tablet Take 1 tablet (5 mg total) by mouth at bedtime. 30 tablet 2  . sildenafil (REVATIO) 20 MG tablet Take 2 to 5 tabs (40 MG to 100 MG) prior to activity. 50 tablet 0   No current facility-administered medications on file prior to visit.    Past Medical History  Diagnosis Date  . Multiple sclerosis (HCC)   . Depression   . COPD (chronic obstructive pulmonary disease) (HCC)     Review of Systems  Constitutional: Negative for fever and chills.  Respiratory: Negative for chest tightness and shortness of breath.   Cardiovascular: Negative for chest pain, palpitations and leg swelling.      Objective:    BP 144/80 mmHg  Pulse 116  Resp 16  Ht 5\' 9"  (1.753 m)  Wt 165 lb (74.844 kg)  BMI 24.36 kg/m2  SpO2 96% Nursing note and vital signs reviewed.  Physical Exam  Constitutional: He is oriented to person, place, and time. He appears well-developed and well-nourished. No distress.  Cardiovascular: Normal rate, regular rhythm, normal heart sounds and intact distal pulses.   Pulmonary/Chest: Effort normal and breath sounds normal.  Musculoskeletal:  Decreased range motion and strength noted in his right upper and lower extremities. Muscle strength is 3+ across the right side. Left side is normal. Pulses were slightly decreased on the right lower extremity with mild decreased temperature compared to the contralateral side.   Neurological: He is alert and oriented to person, place, and time.  Skin:  Skin is warm and dry.  Psychiatric: He has a normal mood and affect. His behavior is normal. Judgment and thought content normal.       Assessment & Plan:   Problem List Items Addressed This Visit      Respiratory   COPD (chronic obstructive pulmonary disease) (HCC)    COPD with unknown current status and not maintained on medication. Obtain pulmonary function testing. He has quit smoking cigarettes but is smoking cigars at this time. Encouraged to quit smoking tobacco  products totally. Follow-up and additional treatment pending PFT results.      Relevant Orders   Pulmonary function test     Nervous and Auditory   MS (multiple sclerosis) (HCC)    Multiple sclerosis appears worsening with decreased right-sided weakness and range of motion. Refer to neurology for further management. Refer to physical therapy to enhance strength and range of motion and prevent falls. Previously maintained on Adderall to help with his decreased energy. Refill Adderall with additional refills per neurology if needed. Previous history from [facility] neurology requested.      Relevant Orders   Ambulatory referral to Physical Therapy   Ambulatory referral to Neurology     Other   Chronic pain    Continues to experience chronic pain associated with multiple sclerosis and previously maintained on oxycodone. He was previously referred to pain management and was unable to complete the referral secondary to pain management being unable to contact him. Referral to pain management made and refill oxycodone pending pain management referral. [facility] controlled substance database reviewed with no irregularities.      Relevant Medications   DULoxetine (CYMBALTA) 60 MG capsule   Oxycodone HCl 10 MG TABS   Other Relevant Orders   Ambulatory referral to Pain Clinic   Depression - Primary    Symptoms and exam consistent with depression exacerbated by his chronic medical conditions. He was started on Cymbalta and self discontinued secondary to his believe he was taking too much medications. Discussed importance of proper use of medication and symptoms of depression. Denies adverse side effects or suicidal ideation. Follow-up in one month or sooner if needed.      Relevant Medications   DULoxetine (CYMBALTA) 60 MG capsule   RESOLVED: ADD (attention deficit disorder)    Entered in error.       Relevant Medications   amphetamine-dextroamphetamine (ADDERALL) 20 MG tablet

## 2016-01-08 NOTE — Assessment & Plan Note (Signed)
Entered in error

## 2016-01-08 NOTE — Assessment & Plan Note (Signed)
COPD with unknown current status and not maintained on medication. Obtain pulmonary function testing. He has quit smoking cigarettes but is smoking cigars at this time. Encouraged to quit smoking tobacco products totally. Follow-up and additional treatment pending PFT results.

## 2016-01-08 NOTE — Patient Instructions (Signed)
Thank you for choosing [facility].  Summary/Instructions:  Please restart taking the Cymbalta which has been sent to pharmacy.  Referrals have been placed for physical therapy, neurology, and pulmonary function testing. Our office will contact you with scheduled times for these  Your prescription(s) have been submitted to your pharmacy or been printed and provided for you. Please take as directed and contact our office if you believe you are having problem(s) with the medication(s) or have any questions.  If your symptoms worsen or fail to improve, please contact our office for further instruction, or in case of emergency go directly to the emergency room at the closest medical facility.

## 2016-01-08 NOTE — Assessment & Plan Note (Signed)
Multiple sclerosis appears worsening with decreased right-sided weakness and range of motion. Refer to neurology for further management. Refer to physical therapy to enhance strength and range of motion and prevent falls. Previously maintained on Adderall to help with his decreased energy. Refill Adderall with additional refills per neurology if needed. Previous history from [facility] neurology requested.

## 2016-01-08 NOTE — Assessment & Plan Note (Signed)
Symptoms and exam consistent with depression exacerbated by his chronic medical conditions. He was started on Cymbalta and self discontinued secondary to his believe he was taking too much medications. Discussed importance of proper use of medication and symptoms of depression. Denies adverse side effects or suicidal ideation. Follow-up in one month or sooner if needed.

## 2016-01-08 NOTE — Assessment & Plan Note (Signed)
Continues to experience chronic pain associated with multiple sclerosis and previously maintained on oxycodone. He was previously referred to pain management and was unable to complete the referral secondary to pain management being unable to contact him. Referral to pain management made and refill oxycodone pending pain management referral. [facility] controlled substance database reviewed with no irregularities.

## 2016-01-15 ENCOUNTER — Telehealth: Payer: Self-pay | Admitting: Family

## 2016-01-15 DIAGNOSIS — M5134 Other intervertebral disc degeneration, thoracic region: Secondary | ICD-10-CM | POA: Diagnosis not present

## 2016-01-15 DIAGNOSIS — M5116 Intervertebral disc disorders with radiculopathy, lumbar region: Secondary | ICD-10-CM | POA: Diagnosis not present

## 2016-01-15 DIAGNOSIS — F17200 Nicotine dependence, unspecified, uncomplicated: Secondary | ICD-10-CM | POA: Diagnosis not present

## 2016-01-15 DIAGNOSIS — M5136 Other intervertebral disc degeneration, lumbar region: Secondary | ICD-10-CM | POA: Diagnosis not present

## 2016-01-15 DIAGNOSIS — Z79899 Other long term (current) drug therapy: Secondary | ICD-10-CM | POA: Diagnosis not present

## 2016-01-15 DIAGNOSIS — M5126 Other intervertebral disc displacement, lumbar region: Secondary | ICD-10-CM | POA: Diagnosis not present

## 2016-01-15 DIAGNOSIS — M5431 Sciatica, right side: Secondary | ICD-10-CM | POA: Diagnosis not present

## 2016-01-15 DIAGNOSIS — F329 Major depressive disorder, single episode, unspecified: Secondary | ICD-10-CM | POA: Diagnosis not present

## 2016-01-15 NOTE — Telephone Encounter (Signed)
Rec'd from [facility] Neurological forward 15 pages to [facility]

## 2016-01-21 ENCOUNTER — Ambulatory Visit: Payer: Medicare Other | Admitting: Physical Therapy

## 2016-02-04 ENCOUNTER — Other Ambulatory Visit (INDEPENDENT_AMBULATORY_CARE_PROVIDER_SITE_OTHER): Payer: Medicare Other

## 2016-02-04 ENCOUNTER — Encounter: Payer: Self-pay | Admitting: Neurology

## 2016-02-04 ENCOUNTER — Ambulatory Visit (INDEPENDENT_AMBULATORY_CARE_PROVIDER_SITE_OTHER): Payer: Medicare Other | Admitting: Neurology

## 2016-02-04 ENCOUNTER — Other Ambulatory Visit: Payer: Self-pay

## 2016-02-04 VITALS — BP 122/86 | HR 103 | Ht 70.0 in | Wt 163.0 lb

## 2016-02-04 DIAGNOSIS — E559 Vitamin D deficiency, unspecified: Secondary | ICD-10-CM

## 2016-02-04 DIAGNOSIS — G35 Multiple sclerosis: Secondary | ICD-10-CM | POA: Diagnosis not present

## 2016-02-04 LAB — CBC WITH DIFFERENTIAL/PLATELET
BASOS ABS: 0 10*3/uL (ref 0.0–0.1)
Basophils Relative: 0.4 % (ref 0.0–3.0)
EOS PCT: 4.8 % (ref 0.0–5.0)
Eosinophils Absolute: 0.3 10*3/uL (ref 0.0–0.7)
HCT: 47.5 % (ref 39.0–52.0)
HEMOGLOBIN: 16 g/dL (ref 13.0–17.0)
Lymphocytes Relative: 36.2 % (ref 12.0–46.0)
Lymphs Abs: 2.6 10*3/uL (ref 0.7–4.0)
MCHC: 33.8 g/dL (ref 30.0–36.0)
MCV: 96.3 fl (ref 78.0–100.0)
MONO ABS: 0.6 10*3/uL (ref 0.1–1.0)
MONOS PCT: 8.8 % (ref 3.0–12.0)
Neutro Abs: 3.6 10*3/uL (ref 1.4–7.7)
Neutrophils Relative %: 49.8 % (ref 43.0–77.0)
Platelets: 259 10*3/uL (ref 150.0–400.0)
RBC: 4.93 Mil/uL (ref 4.22–5.81)
RDW: 14.6 % (ref 11.5–15.5)
WBC: 7.1 10*3/uL (ref 4.0–10.5)

## 2016-02-04 LAB — COMPREHENSIVE METABOLIC PANEL
ALBUMIN: 4.3 g/dL (ref 3.5–5.2)
ALK PHOS: 81 U/L (ref 39–117)
ALT: 27 U/L (ref 0–53)
AST: 20 U/L (ref 0–37)
BILIRUBIN TOTAL: 0.6 mg/dL (ref 0.2–1.2)
BUN: 12 mg/dL (ref 6–23)
CO2: 32 mEq/L (ref 19–32)
Calcium: 9.8 mg/dL (ref 8.4–10.5)
Chloride: 103 mEq/L (ref 96–112)
Creatinine, Ser: 0.94 mg/dL (ref 0.40–1.50)
GFR: 92.56 mL/min (ref >60.00)
Glucose, Bld: 101 mg/dL — ABNORMAL HIGH (ref 70–99)
POTASSIUM: 4.2 meq/L (ref 3.5–5.1)
SODIUM: 140 meq/L (ref 135–145)
TOTAL PROTEIN: 7.3 g/dL (ref 6.0–8.3)

## 2016-02-04 LAB — VITAMIN D 25 HYDROXY (VIT D DEFICIENCY, FRACTURES): VITD: 27.21 ng/mL — AB (ref 30.00–100.00)

## 2016-02-04 MED ORDER — DALFAMPRIDINE ER 10 MG PO TB12: 10.0000 mg | ORAL_TABLET | Freq: Two times a day (BID) | ORAL | Status: DC

## 2016-02-04 MED ORDER — GABAPENTIN 300 MG PO CAPS: 300.0000 mg | ORAL_CAPSULE | Freq: Three times a day (TID) | ORAL | Status: DC

## 2016-02-04 NOTE — Patient Instructions (Addendum)
1.  We will check MRI of brain, cervical and thoracic spine with and without contrast 2.  We will check CBC with diff, CMP, Vitamin D and JC Virus antibody with Index 3.  For pain, take gabapentin 300mg  three times daily 4.  To help walking, we will prescribe Ampyra 10mg  every 12 hours 5.  I want you to follow up 6 months after initiation of medication

## 2016-02-04 NOTE — Progress Notes (Signed)
Quick Note:  Vitamin D level is low (27.21). I recommend starting D3 4000 IU daily and recheck level in 6 months. ______

## 2016-02-04 NOTE — Progress Notes (Signed)
NEUROLOGY CONSULTATION NOTE  Gabriel Burns MRN: [id number] DOB: [birth year]  Referring provider: Jeanine Luz FNP Primary care provider: Jeanine Luz FNP  Reason for consult:  MS  HISTORY OF PRESENT ILLNESS: Gabriel Burns is a 45 year old right-handed male with ADD, COPD, chronic pain, depression and MS who presents to establish care for multiple sclerosis.  History obtained by patient and PCP note.  He was diagnosed with MS around 2010 after experiencing weakness in the right hand.  He had difficulty holding the tattoo pen.  He also started having trouble lifting his right foot and would trip.  He has noticed similar symptoms several for several years prior, but didn't think much of it.  He was evaluated at [facility] where he underwent MRI and lumbar puncture, which was consistent with MS.  He was initially on Copaxone, which was ineffective.  He progressively got worse.  About a couple of years ago, he saw another neurologist in [place] and was started on Tysabri.  He is reportedly JC Virus positive.  He was on it for about 4 months and stopped.  There was a lot going on at the time.  His wife had left him.  He is a single father and he is on disability.  He is experiencing significant depression.  He has not been on disease modifying therapy for the past 8 to 10 months.  He continues to have right sided weakness involving upper and lower extremities.  He has gait issues due to difficulty picking up his leg.  When he is in bed, he often notes shooting pain down his right leg as well.  He has a neurogenic bladder, erectile dysfunction as well as fecal incontinence.  The only head imaging available is a CT from 04/19/15, taken following a fall, which revealed cerebral white matter disease correlating with his history of MS.  Medications include Cymbalta , oxybutynin and Viagra.  PAST MEDICAL HISTORY: Past Medical History  Diagnosis Date  . Multiple sclerosis (HCC)   .  Depression   . COPD (chronic obstructive pulmonary disease) (HCC)     PAST SURGICAL HISTORY: History reviewed. No pertinent past surgical history.  MEDICATIONS: Current Outpatient Prescriptions on File Prior to Visit  Medication Sig Dispense Refill  . amphetamine-dextroamphetamine (ADDERALL) 20 MG tablet Take 1 tablet (20 mg total) by mouth 2 (two) times daily. 60 tablet 0  . bacitracin ointment Apply 1 application topically 2 (two) times daily. 15 g 0  . DULoxetine (CYMBALTA) 60 MG capsule Take 1 capsule (60 mg total) by mouth daily. 30 capsule 2  . oxybutynin (DITROPAN-XL) 5 MG 24 hr tablet Take 1 tablet (5 mg total) by mouth at bedtime. 30 tablet 2  . Oxycodone HCl 10 MG TABS Take 1 tablet (10 mg total) by mouth 2 (two) times daily. 60 tablet 0  . sildenafil (REVATIO) 20 MG tablet Take 2 to 5 tabs (40 MG to 100 MG) prior to activity. 50 tablet 0  . sildenafil (VIAGRA) 100 MG tablet Take 0.5-1 tablets (50-100 mg total) by mouth daily as needed for erectile dysfunction. 5 tablet 2   No current facility-administered medications on file prior to visit.    ALLERGIES: No Known Allergies  FAMILY HISTORY: Family History  Problem Relation Age of Onset  . Hypertension Father   . Alcohol abuse Paternal Grandfather     SOCIAL HISTORY: Social History   Social History  . Marital Status: Single    Spouse Name: N/A  . Number of  Children: 3  . Years of Education: GED   Occupational History  . Disability      MS   Social History Main Topics  . Smoking status: Former Smoker -- 0.75 packs/day for 29 years    Types: Cigarettes    Quit date: 04/23/2015  . Smokeless tobacco: Never Used  . Alcohol Use: 1.8 - 2.4 oz/week    3-4 Cans of beer per week  . Drug Use: No  . Sexual Activity: Not on file   Other Topics Concern  . Not on file   Social History Narrative   Fun: Draw (anything), realism.   Denies religious beliefs effecting health care.     REVIEW OF  SYSTEMS: Constitutional: No fevers, chills, or sweats, no generalized fatigue, change in appetite Eyes: No visual changes, double vision, eye pain Ear, nose and throat: No hearing loss, ear pain, nasal congestion, sore throat Cardiovascular: No chest pain, palpitations Respiratory:  No shortness of breath at rest or with exertion, wheezes GastrointestinaI: No nausea, vomiting, diarrhea, abdominal pain, fecal incontinence Genitourinary:  No dysuria, urinary retention or frequency Musculoskeletal:  No neck pain, back pain Integumentary: No rash, pruritus, skin lesions Neurological: as above Psychiatric: depression Endocrine: No palpitations, fatigue, diaphoresis, mood swings, change in appetite, change in weight, increased thirst Hematologic/Lymphatic:  No anemia, purpura, petechiae. Allergic/Immunologic: no itchy/runny eyes, nasal congestion, recent allergic reactions, rashes  PHYSICAL EXAM: Filed Vitals:   02/04/16 0852  BP: 122/86  Pulse: 103   General: No acute distress.  Head:  Normocephalic/atraumatic Eyes:  fundi examined but not visualized Neck: supple, no paraspinal tenderness, full range of motion Back: No paraspinal tenderness Heart: regular rate and rhythm Lungs: Clear to auscultation bilaterally. Vascular: No carotid bruits. Neurological Exam: Mental status: alert and oriented to person, place, and time, recent and remote memory intact, fund of knowledge intact, attention and concentration intact, speech fluent and not dysarthric, language intact. Cranial nerves: CN I: not tested CN II: pupils equal, round and reactive to light, visual fields intact CN III, IV, VI:  full range of motion, no nystagmus, no ptosis CN V: facial sensation intact CN VII: upper and lower face symmetric CN VIII: hearing intact CN IX, X: gag intact, uvula midline CN XI: sternocleidomastoid and trapezius muscles intact CN XII: tongue midline Bulk & Tone: increased right lower extremity  tone, no fasciculations. Motor:  Decreased right finger tapping speed and amplitude; 5-/5 right deltoid; 4+/5 right triceps, grip and hamstring; 3+-4-/5 right hip flexion; 3+/5 right ankle dorsiflexion Sensation:  Pinprick and vibration sensation intact Deep Tendon Reflexes:  3+ right patellar, otherwise 2+ throughout; right Babinski Finger to nose testing:  Without dysmetria.  Heel to shin:  Without dysmetria.  Gait:  Right limp.  Able to turn.  Unable to tandem walk.  Timed 25 foot walk 11.77 seconds; Romberg positive.  IMPRESSION: Multiple sclerosis.  He was given a diagnosis of relapsing-remitting MS, however it sounds like he never had a relapsing-remitting course.  It raises the possibility of primary progressive MS.  PLAN: 1.  We will check MRI of brain, cervical and thoracic spine with and without contrast 2.  We will check CBC with diff, CMP, Vitamin D and JC Virus antibody with Index 3.  For pain, take gabapentin 300mg  three times daily 4.  To help walking, we will prescribe Ampyra 10mg  every 12 hours 5.  I want him to follow up 6 months after initiation of medication 6.  Will get notes from prior neurologists.  45 minutes spent face to face with patient, over 50% spent discussing diagnosis and plan.  Thank you for allowing me to take part in the care of this patient.  Shon Millet, DO  CC:  Jeanine Luz, FNP

## 2016-02-05 ENCOUNTER — Telehealth: Payer: Self-pay

## 2016-02-05 ENCOUNTER — Ambulatory Visit: Payer: Medicare Other | Attending: Family | Admitting: Rehabilitative and Restorative Service Providers"

## 2016-02-05 ENCOUNTER — Encounter: Payer: Self-pay | Admitting: Neurology

## 2016-02-05 NOTE — Telephone Encounter (Signed)
VM not set up, Couldn't leave message. Also wanted to let pt know we did get his Amypra paid for. Will need to pick up. Was denied at first, so he just needs to ignore that letter when he gets it.

## 2016-02-05 NOTE — Telephone Encounter (Signed)
-----   Message from Drema Dallas, DO sent at 02/04/2016  1:13 PM EDT ----- Vitamin D level is low (27.21).  I recommend starting D3 4000 IU daily and recheck level in 6 months.

## 2016-02-08 LAB — STRATIFY JCV AB (W/ INDEX) W/ RFLX
INDEX VALUE: 1.88 — AB
JCV ANTIBODY: POSITIVE — AB

## 2016-02-09 ENCOUNTER — Encounter: Payer: Self-pay | Admitting: Family

## 2016-02-09 ENCOUNTER — Ambulatory Visit (INDEPENDENT_AMBULATORY_CARE_PROVIDER_SITE_OTHER): Payer: Medicare Other | Admitting: Family

## 2016-02-09 DIAGNOSIS — F329 Major depressive disorder, single episode, unspecified: Secondary | ICD-10-CM | POA: Diagnosis not present

## 2016-02-09 DIAGNOSIS — G35 Multiple sclerosis: Secondary | ICD-10-CM

## 2016-02-09 DIAGNOSIS — N529 Male erectile dysfunction, unspecified: Secondary | ICD-10-CM | POA: Diagnosis not present

## 2016-02-09 DIAGNOSIS — G8929 Other chronic pain: Secondary | ICD-10-CM | POA: Diagnosis not present

## 2016-02-09 DIAGNOSIS — G35D Multiple sclerosis, unspecified: Secondary | ICD-10-CM

## 2016-02-09 DIAGNOSIS — F32A Depression, unspecified: Secondary | ICD-10-CM

## 2016-02-09 MED ORDER — OXYCODONE HCL 10 MG PO TABS: 10.0000 mg | ORAL_TABLET | Freq: Two times a day (BID) | ORAL | Status: DC

## 2016-02-09 MED ORDER — SILDENAFIL CITRATE 20 MG PO TABS: ORAL_TABLET | ORAL | Status: DC

## 2016-02-09 MED ORDER — AMPHETAMINE-DEXTROAMPHETAMINE 20 MG PO TABS: 20.0000 mg | ORAL_TABLET | Freq: Two times a day (BID) | ORAL | Status: DC

## 2016-02-09 NOTE — Patient Instructions (Addendum)
Thank you for choosing [facility].  Summary/Instructions:  Please continue to take your medications as prescribed.  Follow up with neurology as scheduled.   Your prescription(s) have been submitted to your pharmacy or been printed and provided for you. Please take as directed and contact our office if you believe you are having problem(s) with the medication(s) or have any questions.  Referrals have been made during this visit. You should expect to hear back from our schedulers in about 7-10 days in regards to establishing an appointment with the specialists we discussed.   If your symptoms worsen or fail to improve, please contact our office for further instruction, or in case of emergency go directly to the emergency room at the closest medical facility.

## 2016-02-09 NOTE — Assessment & Plan Note (Signed)
Continues to experience the symptoms of depression, however has not been taking the Cymbalta as prescribed secondary to monetary factors. He will refill it when he is able. Referral to psychiatry placed for additional management. No thoughts of suicidal ideation.

## 2016-02-09 NOTE — Assessment & Plan Note (Addendum)
Continues to experience chronic pain associated with MS and lumbar disc pathology. Adequately maintained on oxycodone and able to complete his activities of daily living without adverse side effects or constipation. Continue current dosage of oxycodone. [place] controlled substance database reviewed with no irregularities.

## 2016-02-09 NOTE — Assessment & Plan Note (Signed)
Currently working with neurology and awaiting MRI imaging of this brain and cervical spine. Started on gabapentin which he takes as prescribed with minimal improvement noted today. Has yet to start taking the Ampyra as pharmacy is awaiting medication. Follow-up pending MRI results and with neurology in 6 months as scheduled. Having difficulty with physical therapy secondary to monetary factors.

## 2016-02-09 NOTE — Progress Notes (Signed)
Subjective:    Patient ID: Gabriel Burns, male    DOB: 03-Sep-1971, 45 y.o.   MRN: [id number]  Chief Complaint  Patient presents with  . Medication Refill    refill of pain meds and adderall, sildenafil    HPI:  Gabriel Burns is a 45 y.o. male who  has a past medical history of Multiple sclerosis (HCC); Depression; and COPD (chronic obstructive pulmonary disease) (HCC). and presents today for an office follow up.  1.) Chronic pain - Currently maintained on oxycodone. Reports taking the medication as prescribed and denies adverse side effects or constipation. Continues to experience the associated symptom of pain located in multiple joints. Pain severity with the current medication regimen makes in manageable and he is able to function and perform his activities of daily living.  2.) Multiple Sclerosis - Recent evaluated by neurology with concern for primary progressive multiple sclerosis on started on gabapentin to help with neurological pain and Ampyra to help with his walking. He is also maintained on Adderall to help with his energy levels. Reports taking the medications as prescribed and notes that Adderall does help to increase his energy levels. He is awaiting an MRI of the brain and cervical/thoracic spine.   3.) Erectile dysfunction - Currently maintained on the sildenafil. Reports taking The medication as prescribed without adverse side effects or priapism.   No Known Allergies   Current Outpatient Prescriptions on File Prior to Visit  Medication Sig Dispense Refill  . bacitracin ointment Apply 1 application topically 2 (two) times daily. 15 g 0  . dalfampridine (AMPYRA) 10 MG TB12 Take 1 tablet (10 mg total) by mouth 2 (two) times daily. 60 tablet 5  . DULoxetine (CYMBALTA) 60 MG capsule Take 1 capsule (60 mg total) by mouth daily. 30 capsule 2  . gabapentin (NEURONTIN) 300 MG capsule Take 1 capsule (300 mg total) by mouth 3 (three) times daily. 90 capsule 5  .  oxybutynin (DITROPAN-XL) 5 MG 24 hr tablet Take 1 tablet (5 mg total) by mouth at bedtime. 30 tablet 2   No current facility-administered medications on file prior to visit.    No Known Allergies   Current Outpatient Prescriptions on File Prior to Visit  Medication Sig Dispense Refill  . bacitracin ointment Apply 1 application topically 2 (two) times daily. 15 g 0  . dalfampridine (AMPYRA) 10 MG TB12 Take 1 tablet (10 mg total) by mouth 2 (two) times daily. 60 tablet 5  . DULoxetine (CYMBALTA) 60 MG capsule Take 1 capsule (60 mg total) by mouth daily. 30 capsule 2  . gabapentin (NEURONTIN) 300 MG capsule Take 1 capsule (300 mg total) by mouth 3 (three) times daily. 90 capsule 5  . oxybutynin (DITROPAN-XL) 5 MG 24 hr tablet Take 1 tablet (5 mg total) by mouth at bedtime. 30 tablet 2   No current facility-administered medications on file prior to visit.    Past Medical History  Diagnosis Date  . Multiple sclerosis (HCC)   . Depression   . COPD (chronic obstructive pulmonary disease) (HCC)     Review of Systems  Constitutional: Negative for fever and chills.  Respiratory: Negative for chest tightness and shortness of breath.   Cardiovascular: Negative for chest pain, palpitations and leg swelling.  Genitourinary: Negative for dysuria, urgency, frequency, hematuria, flank pain, discharge, penile swelling and penile pain.  Musculoskeletal: Positive for back pain and arthralgias.      Objective:    BP 138/90 mmHg  Pulse 84  Temp(Src) 97.9 F (36.6 C) (Other (Comment))  Resp 16  Ht 5\' 10"  (1.778 m)  Wt 166 lb (75.297 kg)  BMI 23.82 kg/m2  SpO2 94% Nursing note and vital signs reviewed.  Physical Exam  Constitutional: He is oriented to person, place, and time. He appears well-developed and well-nourished. No distress.  Cardiovascular: Normal rate, regular rhythm, normal heart sounds and intact distal pulses.   Pulmonary/Chest: Effort normal and breath sounds normal.    Musculoskeletal:  Decreased range motion and strength noted in his right upper and lower extremities. Muscle strength is 3+ across the right side. Left side is normal. Pulses were slightly decreased on the right lower extremity with mild decreased temperature compared to the contralateral side.   Neurological: He is alert and oriented to person, place, and time.  Skin: Skin is warm and dry.  Psychiatric: He has a normal mood and affect. His behavior is normal. Judgment and thought content normal.       Assessment & Plan:   Problem List Items Addressed This Visit      Nervous and Auditory   MS (multiple sclerosis) (HCC)    Currently working with neurology and awaiting MRI imaging of this brain and cervical spine. Started on gabapentin which he takes as prescribed with minimal improvement noted today. Has yet to start taking the Ampyra as pharmacy is awaiting medication. Follow-up pending MRI results and with neurology in 6 months as scheduled. Having difficulty with physical therapy secondary to monetary factors.      Relevant Medications   amphetamine-dextroamphetamine (ADDERALL) 20 MG tablet     Genitourinary   Erectile dysfunction    Erectile dysfunction most likely multifactorial. Stable with current sildenafil without adverse side effects or priapism. Continue current dosage of sildenafil.       Relevant Medications   sildenafil (REVATIO) 20 MG tablet     Other   Chronic pain    Continues to experience chronic pain associated with MS and lumbar disc pathology. Adequately maintained on oxycodone and able to complete his activities of daily living without adverse side effects or constipation. Continue current dosage of oxycodone. [place] controlled substance database reviewed with no irregularities.      Relevant Medications   Oxycodone HCl 10 MG TABS   Depression    Continues to experience the symptoms of depression, however has not been taking the Cymbalta as  prescribed secondary to monetary factors. He will refill it when he is able. Referral to psychiatry placed for additional management. No thoughts of suicidal ideation.      Relevant Orders   Ambulatory referral to Psychiatry       I have discontinued Mr. Mcclafferty's sildenafil. I have also changed his sildenafil. Additionally, I am having him maintain his bacitracin, oxybutynin, DULoxetine, gabapentin, dalfampridine, amphetamine-dextroamphetamine, and Oxycodone HCl.   Meds ordered this encounter  Medications  . amphetamine-dextroamphetamine (ADDERALL) 20 MG tablet    Sig: Take 1 tablet (20 mg total) by mouth 2 (two) times daily.    Dispense:  60 tablet    Refill:  0    Do not fill until 01/12/16    Order Specific Question:  Supervising Provider    Answer:  Hillard Danker A [[id number]]  . sildenafil (REVATIO) 20 MG tablet    Sig: Take 2 to 5 tabs (40 MG to 100 MG) prior to activity as needed.    Dispense:  50 tablet    Refill:  0    Order Specific Question:  Supervising  Provider    Answer:  Hillard Danker A [[id number]]  . Oxycodone HCl 10 MG TABS    Sig: Take 1 tablet (10 mg total) by mouth 2 (two) times daily.    Dispense:  60 tablet    Refill:  0    Order Specific Question:  Supervising Provider    Answer:  Hillard Danker A [[id number]]     Follow-up: Return in about 1 month (around 03/11/2016).  Jeanine Luz, FNP

## 2016-02-09 NOTE — Assessment & Plan Note (Signed)
Erectile dysfunction most likely multifactorial. Stable with current sildenafil without adverse side effects or priapism. Continue current dosage of sildenafil.

## 2016-02-09 NOTE — Progress Notes (Signed)
Pre visit review using our clinic review tool, if applicable. No additional management support is needed unless otherwise documented below in the visit note. 

## 2016-02-10 ENCOUNTER — Telehealth: Payer: Self-pay

## 2016-02-10 DIAGNOSIS — G35 Multiple sclerosis: Secondary | ICD-10-CM

## 2016-02-10 NOTE — Telephone Encounter (Signed)
-----   Message from Drema Dallas, DO sent at 02/10/2016  1:48 PM EDT ----- Due to the high titer of his JC Virus antibody, I do not feel comfortable restarting him on Tysabri.  I recommend either Gilenya or Tecfidera.  Gilenya may be a good option, but it requires further workup. We would need to check VZV antibody titer, EKG, and formal opthalmologic evaluation for macular edema (build up of fluid in the eye).  Then, when he takes the first dose, he needs to be evaluated by cardiology at a cardiologist's office for several hours, because the first dose may cause transient drop in heart rate.  I would give him information on both Tecfidera and Gilenya, so he can read up on it himself.

## 2016-02-10 NOTE — Telephone Encounter (Signed)
Pt aware. Will come up tomorrow to sign Gilenya paperwork. Pt was asking about Ocrevus, says he's been reading up on that and it sounds beneficial for him. Please advise.

## 2016-02-10 NOTE — Addendum Note (Signed)
Addended by: Sheilah Mins A on: 02/10/2016 02:19 PM   Modules accepted: Orders

## 2016-02-10 NOTE — Telephone Encounter (Signed)
-----   Message from Adam R Jaffe, DO sent at 02/10/2016  1:48 PM EDT ----- Due to the high titer of his JC Virus antibody, I do not feel comfortable restarting him on Tysabri.  I recommend either Gilenya or Tecfidera.  Gilenya may be a good option, but it requires further workup. We would need to check VZV antibody titer, EKG, and formal opthalmologic evaluation for macular edema (build up of fluid in the eye).  Then, when he takes the first dose, he needs to be evaluated by cardiology at a cardiologist's office for several hours, because the first dose may cause transient drop in heart rate.  I would give him information on both Tecfidera and Gilenya, so he can read up on it himself. 

## 2016-02-10 NOTE — Telephone Encounter (Signed)
Will refer pt to Ophthalmology for eye exam before starting Gilenya. Will fax to Dr. Elmer Picker.

## 2016-02-10 NOTE — Telephone Encounter (Signed)
[facility] called in regards to a prescription for PT/Dawn CB#[phone]

## 2016-02-11 ENCOUNTER — Other Ambulatory Visit: Payer: Medicare Other

## 2016-02-11 ENCOUNTER — Encounter (HOSPITAL_COMMUNITY): Payer: Medicare Other

## 2016-02-13 ENCOUNTER — Ambulatory Visit
Admission: RE | Admit: 2016-02-13 | Discharge: 2016-02-13 | Disposition: A | Discharge status: Home / Self Care | Payer: Medicare Other | Source: Ambulatory Visit | Attending: Neurology | Admitting: Neurology

## 2016-02-13 DIAGNOSIS — G35 Multiple sclerosis: Secondary | ICD-10-CM | POA: Diagnosis not present

## 2016-02-13 DIAGNOSIS — M47812 Spondylosis without myelopathy or radiculopathy, cervical region: Secondary | ICD-10-CM | POA: Diagnosis not present

## 2016-02-13 MED ORDER — GADOBENATE DIMEGLUMINE 529 MG/ML IV SOLN
15.0000 mL | Freq: Once | INTRAVENOUS | Status: AC | PRN
  Administered 2016-02-13: 15 mL via INTRAVENOUS

## 2016-02-16 ENCOUNTER — Ambulatory Visit
Admission: RE | Admit: 2016-02-16 | Discharge: 2016-02-16 | Disposition: A | Discharge status: Home / Self Care | Payer: Medicare Other | Source: Ambulatory Visit | Attending: Neurology | Admitting: Neurology

## 2016-02-16 DIAGNOSIS — G35 Multiple sclerosis: Secondary | ICD-10-CM

## 2016-02-16 DIAGNOSIS — M5124 Other intervertebral disc displacement, thoracic region: Secondary | ICD-10-CM | POA: Diagnosis not present

## 2016-02-16 MED ORDER — GADOBENATE DIMEGLUMINE 529 MG/ML IV SOLN
15.0000 mL | Freq: Once | INTRAVENOUS | Status: AC | PRN
  Administered 2016-02-16: 15 mL via INTRAVENOUS

## 2016-02-17 ENCOUNTER — Telehealth: Payer: Self-pay

## 2016-02-17 NOTE — Telephone Encounter (Signed)
Referral was faxed to Dr. Alben Spittle @ [facility]. On 02/10/16. Gilenya referral was faxed in w/ request for the VZV antibody and EKG to be performed by them. Will call pt after 9 a.m.

## 2016-02-17 NOTE — Telephone Encounter (Signed)
-----   Message from Drema Dallas, DO sent at 02/17/2016  7:20 AM EDT ----- MRIs show progression of disease since a year and a half ago.  If we are proceeding with Gilenya, then we need to check VZV antibody titer and EKG, in addition to the ophthalmology exam.

## 2016-03-11 ENCOUNTER — Encounter: Payer: Self-pay | Admitting: Family

## 2016-03-11 ENCOUNTER — Ambulatory Visit (INDEPENDENT_AMBULATORY_CARE_PROVIDER_SITE_OTHER): Payer: Medicare Other | Admitting: Family

## 2016-03-11 VITALS — BP 118/86 | HR 87 | Temp 97.9°F | Resp 14 | Ht 70.0 in | Wt 163.1 lb

## 2016-03-11 DIAGNOSIS — G35 Multiple sclerosis: Secondary | ICD-10-CM | POA: Diagnosis not present

## 2016-03-11 DIAGNOSIS — F32A Depression, unspecified: Secondary | ICD-10-CM

## 2016-03-11 DIAGNOSIS — G8929 Other chronic pain: Secondary | ICD-10-CM

## 2016-03-11 DIAGNOSIS — F329 Major depressive disorder, single episode, unspecified: Secondary | ICD-10-CM | POA: Diagnosis not present

## 2016-03-11 DIAGNOSIS — N529 Male erectile dysfunction, unspecified: Secondary | ICD-10-CM | POA: Diagnosis not present

## 2016-03-11 MED ORDER — SILDENAFIL CITRATE 20 MG PO TABS: ORAL_TABLET | ORAL | Status: DC

## 2016-03-11 MED ORDER — OXYCODONE HCL 10 MG PO TABS: 10.0000 mg | ORAL_TABLET | Freq: Two times a day (BID) | ORAL | Status: DC

## 2016-03-11 MED ORDER — AMPHETAMINE-DEXTROAMPHETAMINE 20 MG PO TABS: 20.0000 mg | ORAL_TABLET | Freq: Two times a day (BID) | ORAL | Status: DC

## 2016-03-11 MED ORDER — DULOXETINE HCL 60 MG PO CPEP: 60.0000 mg | ORAL_CAPSULE | Freq: Every day | ORAL | Status: DC

## 2016-03-11 NOTE — Assessment & Plan Note (Signed)
Stable with current dosage of sildenafil when taken as needed. Denies adverse side effects. Continue current dosage of sildenafil.

## 2016-03-11 NOTE — Assessment & Plan Note (Signed)
Chronic pain appears adequately controlled with current dosage of oxycodone, gabapentin, and Cymbalta. No adverse side effects. Functionality remains adequate and has improved including physical activity in the last couple weeks. Denies constipation. Continue current dosage of oxycodone, gabapentin, and Cymbalta.

## 2016-03-11 NOTE — Assessment & Plan Note (Signed)
MRI completed which shows progression of multiple sclerosis in the past year and a half. Appears stable currently with improved functionality although he questions any significant improvement from Ampyra. Continue current dosage of Ampyra with changes per neurology. Continue current dosage of Adderall for energy. [place] controlled substance database reviewed with no irregularities.

## 2016-03-11 NOTE — Progress Notes (Signed)
Pre visit review using our clinic review tool, if applicable. No additional management support is needed unless otherwise documented below in the visit note. 

## 2016-03-11 NOTE — Patient Instructions (Addendum)
Thank you for choosing [facility].  Summary/Instructions:  Good to see some improvements.   Continue to take your medications as prescribed.  Continue to follow up with neurology and opthalmology.  Your prescription(s) have been submitted to your pharmacy or been printed and provided for you. Please take as directed and contact our office if you believe you are having problem(s) with the medication(s) or have any questions.  If your symptoms worsen or fail to improve, please contact our office for further instruction, or in case of emergency go directly to the emergency room at the closest medical facility.

## 2016-03-11 NOTE — Progress Notes (Signed)
Subjective:    Patient ID: Gabriel Burns, male    DOB: 13-Nov-1970, 45 y.o.   MRN: [id number]  Chief Complaint  Patient presents with  . Follow-up    med refills of everything     HPI:  Gabriel Burns is a 45 y.o. male who  has a past medical history of Multiple sclerosis (HCC); Depression; and COPD (chronic obstructive pulmonary disease) (HCC). and presents today for a follow up.   1 .) Chronic pain - Currently maintained on oxycodone. Reports taking the medication as prescribed and denies adverse side effects or constipation. Continues to experience the associated symptom of pain located in multiple joints. Pain severity with the current medication regimen makes in manageable and he is able to function and perform his activities of daily living. Continues to take care of his children. Without the pain medication, his physical ability to complete activities becomes significantly more difficult.   2.) Multiple Sclerosis - Currently maintained on gabapentin, Cymbalta, and Ampyra. Reports taking the medication as prescribed and denies adverse side effects. He has also been taking Adderall which helps with his energy levels. MRI of the cervical spine and brain showed chronic extensive demyelinating plaques throughout the cervical cord without new plaque noted. There is also minimal cervical spondylitic changes most notable at C5-C6 with a shallow bulge slightly greater to the left with mild narrowing of the ventral thecal sac and minimal foraminal narrowing; there is slight progression of white matter changes compared to previous studies in the periatrial region bilaterally. None of the areas demonstrated restricted motion or enhancement is seen with areas of active demyelination. Also progressive opacification of the frontal sinuses, ethmoid sinus air cells, sphenoid sinus mucosal thickening maxillary sinus mucosal thickening and polyploid opacification superior and middle turbinates regions.  Per neurology MRI shows progression of disease since previous scans.   3.) Erectile dysfunction - Currently maintained on the sildenafil. Reports taking The medication as prescribed without adverse side effects or priapism. Symptoms are well controlled.   No Known Allergies   Current Outpatient Prescriptions on File Prior to Visit  Medication Sig Dispense Refill  . bacitracin ointment Apply 1 application topically 2 (two) times daily. 15 g 0  . dalfampridine (AMPYRA) 10 MG TB12 Take 1 tablet (10 mg total) by mouth 2 (two) times daily. 60 tablet 5  . gabapentin (NEURONTIN) 300 MG capsule Take 1 capsule (300 mg total) by mouth 3 (three) times daily. 90 capsule 5  . oxybutynin (DITROPAN-XL) 5 MG 24 hr tablet Take 1 tablet (5 mg total) by mouth at bedtime. 30 tablet 2   No current facility-administered medications on file prior to visit.     No past surgical history on file.   Past Medical History  Diagnosis Date  . Multiple sclerosis (HCC)   . Depression   . COPD (chronic obstructive pulmonary disease) (HCC)      Review of Systems  Constitutional: Negative for fever and chills.  Respiratory: Negative for chest tightness and shortness of breath.   Cardiovascular: Negative for chest pain, palpitations and leg swelling.  Musculoskeletal: Positive for myalgias and arthralgias.  Neurological: Negative for weakness and headaches.  Psychiatric/Behavioral: Negative for suicidal ideas, sleep disturbance and dysphoric mood.      Objective:    BP 118/86 mmHg  Pulse 87  Temp(Src) 97.9 F (36.6 C) (Oral)  Resp 14  Ht 5\' 10"  (1.778 m)  Wt 163 lb 1.9 oz (73.991 kg)  BMI 23.41 kg/m2  SpO2 99%  Nursing note and vital signs reviewed.  Physical Exam  Constitutional: He is oriented to person, place, and time. He appears well-developed and well-nourished. No distress.  Cardiovascular: Normal rate, regular rhythm, normal heart sounds and intact distal pulses.   Pulmonary/Chest: Effort  normal and breath sounds normal.  Musculoskeletal:  Decreased range motion and strength noted in his right upper and lower extremities. Muscle strength is 3+ across the right side. Left side is normal. Pulses were slightly decreased on the right lower extremity with mild decreased temperature compared to the contralateral side.  Neurological: He is alert and oriented to person, place, and time.  Skin: Skin is warm and dry.  Psychiatric: He has a normal mood and affect. His behavior is normal. Judgment and thought content normal.       Assessment & Plan:   Problem List Items Addressed This Visit      Nervous and Auditory   MS (multiple sclerosis) (HCC)    MRI completed which shows progression of multiple sclerosis in the past year and a half. Appears stable currently with improved functionality although he questions any significant improvement from Ampyra. Continue current dosage of Ampyra with changes per neurology. Continue current dosage of Adderall for energy. [place] controlled substance database reviewed with no irregularities.      Relevant Medications   amphetamine-dextroamphetamine (ADDERALL) 20 MG tablet     Genitourinary   Erectile dysfunction    Stable with current dosage of sildenafil when taken as needed. Denies adverse side effects. Continue current dosage of sildenafil.      Relevant Medications   sildenafil (REVATIO) 20 MG tablet     Other   Chronic pain - Primary    Chronic pain appears adequately controlled with current dosage of oxycodone, gabapentin, and Cymbalta. No adverse side effects. Functionality remains adequate and has improved including physical activity in the last couple weeks. Denies constipation. Continue current dosage of oxycodone, gabapentin, and Cymbalta.      Relevant Medications   Oxycodone HCl 10 MG TABS   DULoxetine (CYMBALTA) 60 MG capsule   Depression   Relevant Medications   DULoxetine (CYMBALTA) 60 MG capsule       I am having Mr.  Gabriel Burns maintain his bacitracin, oxybutynin, gabapentin, dalfampridine, Oxycodone HCl, amphetamine-dextroamphetamine, DULoxetine, and sildenafil.   Meds ordered this encounter  Medications  . Oxycodone HCl 10 MG TABS    Sig: Take 1 tablet (10 mg total) by mouth 2 (two) times daily.    Dispense:  60 tablet    Refill:  0    Order Specific Question:  Supervising Provider    Answer:  Hillard Danker A [[id number]]  . amphetamine-dextroamphetamine (ADDERALL) 20 MG tablet    Sig: Take 1 tablet (20 mg total) by mouth 2 (two) times daily.    Dispense:  60 tablet    Refill:  0    Order Specific Question:  Supervising Provider    Answer:  Hillard Danker A [[id number]]  . DULoxetine (CYMBALTA) 60 MG capsule    Sig: Take 1 capsule (60 mg total) by mouth daily.    Dispense:  30 capsule    Refill:  2    Order Specific Question:  Supervising Provider    Answer:  Hillard Danker A [[id number]]  . sildenafil (REVATIO) 20 MG tablet    Sig: Take 2 to 5 tabs (40 MG to 100 MG) prior to activity as needed.    Dispense:  50 tablet    Refill:  0  Order Specific Question:  Supervising Provider    Answer:  Hillard Danker A [[id number]]     Follow-up: Return in about 1 month (around 04/10/2016), or if symptoms worsen or fail to improve.  Jeanine Luz, FNP

## 2016-03-31 ENCOUNTER — Encounter (HOSPITAL_COMMUNITY): Payer: Medicare Other | Admitting: Psychiatry

## 2016-03-31 NOTE — Progress Notes (Signed)
Patient no showed.  Encounter was open in [place].   This encounter was created in error - please disregard.

## 2016-04-07 ENCOUNTER — Telehealth: Payer: Self-pay | Admitting: *Deleted

## 2016-04-07 DIAGNOSIS — G35 Multiple sclerosis: Secondary | ICD-10-CM

## 2016-04-07 DIAGNOSIS — G8929 Other chronic pain: Secondary | ICD-10-CM

## 2016-04-07 MED ORDER — AMPHETAMINE-DEXTROAMPHETAMINE 20 MG PO TABS: 20.0000 mg | ORAL_TABLET | Freq: Two times a day (BID) | ORAL | Status: DC

## 2016-04-07 MED ORDER — OXYCODONE HCL 10 MG PO TABS: 10.0000 mg | ORAL_TABLET | Freq: Two times a day (BID) | ORAL | Status: DC

## 2016-04-07 NOTE — Telephone Encounter (Signed)
Rec'd call pt states he is due for refills this Sat. Wanting to come Friday to pick up his Adderral, Oxycodone, Cymbalta, Gabapentin, and Dalfampridine. Want written scripts on all meds...Raechel Chute

## 2016-04-07 NOTE — Telephone Encounter (Signed)
Oxycodone and Adderall prescriptions written. All other medications have refills.

## 2016-04-08 NOTE — Telephone Encounter (Signed)
Tried calling pt no answer can't leave msg due to vm not set-up.Place scripts in cabinet...Raechel Chute

## 2016-04-12 ENCOUNTER — Ambulatory Visit: Payer: Medicare Other | Admitting: Family

## 2016-04-12 DIAGNOSIS — Z0289 Encounter for other administrative examinations: Secondary | ICD-10-CM

## 2016-05-06 ENCOUNTER — Telehealth: Payer: Self-pay | Admitting: Emergency Medicine

## 2016-05-06 DIAGNOSIS — G8929 Other chronic pain: Secondary | ICD-10-CM

## 2016-05-06 DIAGNOSIS — F329 Major depressive disorder, single episode, unspecified: Secondary | ICD-10-CM

## 2016-05-06 DIAGNOSIS — G35 Multiple sclerosis: Secondary | ICD-10-CM

## 2016-05-06 DIAGNOSIS — N529 Male erectile dysfunction, unspecified: Secondary | ICD-10-CM

## 2016-05-06 DIAGNOSIS — F32A Depression, unspecified: Secondary | ICD-10-CM

## 2016-05-06 NOTE — Telephone Encounter (Signed)
Please advise 

## 2016-05-06 NOTE — Telephone Encounter (Signed)
Pt called about his referral to pain management and  would like to know if he can get a prescription refill on   -amphetamine-dextroamphetamine (ADDERALL) 20 MG   tablet  -Oxycodone HCl 10 MG TABS -DULoxetine (CYMBALTA) 60 MG capsule -oxybutynin (DITROPAN-XL) 5 MG 24 hr tablet  -sildenafil (REVATIO) 20 MG tablet.   Pharamacy is [facility] on [address] . He will come by and pick up the ones that cant be sent. Please follow up thanks.

## 2016-05-07 MED ORDER — DULOXETINE HCL 60 MG PO CPEP: 60.0000 mg | ORAL_CAPSULE | Freq: Every day | ORAL | 2 refills | Status: DC

## 2016-05-07 MED ORDER — AMPHETAMINE-DEXTROAMPHETAMINE 20 MG PO TABS: 20.0000 mg | ORAL_TABLET | Freq: Two times a day (BID) | ORAL | 0 refills | Status: DC

## 2016-05-07 MED ORDER — OXYCODONE HCL 10 MG PO TABS: 10.0000 mg | ORAL_TABLET | Freq: Two times a day (BID) | ORAL | 0 refills | Status: DC

## 2016-05-07 MED ORDER — OXYBUTYNIN CHLORIDE ER 5 MG PO TB24: 5.0000 mg | ORAL_TABLET | Freq: Every day | ORAL | 2 refills | Status: DC

## 2016-05-07 MED ORDER — SILDENAFIL CITRATE 20 MG PO TABS: ORAL_TABLET | ORAL | 0 refills | Status: DC

## 2016-05-07 NOTE — Telephone Encounter (Signed)
Pt aware refills are ready for pick up

## 2016-05-07 NOTE — Telephone Encounter (Signed)
Medications refilled. Will need follow up for additional refills of controlled substances.

## 2016-05-25 ENCOUNTER — Telehealth: Payer: Self-pay | Admitting: Family

## 2016-05-25 ENCOUNTER — Ambulatory Visit: Payer: Medicare Other | Admitting: Family

## 2016-05-25 DIAGNOSIS — F17200 Nicotine dependence, unspecified, uncomplicated: Secondary | ICD-10-CM | POA: Diagnosis not present

## 2016-05-25 DIAGNOSIS — M5441 Lumbago with sciatica, right side: Secondary | ICD-10-CM | POA: Diagnosis not present

## 2016-05-25 DIAGNOSIS — Z7689 Persons encountering health services in other specified circumstances: Secondary | ICD-10-CM

## 2016-05-25 DIAGNOSIS — G35 Multiple sclerosis: Secondary | ICD-10-CM | POA: Diagnosis not present

## 2016-05-25 NOTE — Telephone Encounter (Signed)
May reschedule if he calls back.  

## 2016-05-25 NOTE — Telephone Encounter (Signed)
Patient no showed 8/15 for medication fu.  Please advise.

## 2016-05-26 NOTE — Telephone Encounter (Signed)
noted 

## 2016-05-27 DIAGNOSIS — M5416 Radiculopathy, lumbar region: Secondary | ICD-10-CM | POA: Diagnosis not present

## 2016-05-27 DIAGNOSIS — G35 Multiple sclerosis: Secondary | ICD-10-CM | POA: Diagnosis not present

## 2016-05-27 DIAGNOSIS — Z87828 Personal history of other (healed) physical injury and trauma: Secondary | ICD-10-CM | POA: Diagnosis not present

## 2016-05-27 DIAGNOSIS — F17290 Nicotine dependence, other tobacco product, uncomplicated: Secondary | ICD-10-CM | POA: Diagnosis not present

## 2016-06-07 ENCOUNTER — Telehealth: Payer: Self-pay | Admitting: Emergency Medicine

## 2016-06-07 NOTE — Telephone Encounter (Signed)
Gabriel Burns will address refills tomorrow at visit...Gabriel Burns

## 2016-06-07 NOTE — Telephone Encounter (Signed)
Pt called and asked for prescription refills for DULoxetine (CYMBALTA) 60 MG capsule, Oxycodone HCl 10 MG TABS, amphetamine-dextroamphetamine (ADDERALL) 20 MG tablet, oxybutynin (DITROPAN-XL) 5 MG 24 hr tablet and sildenafil (REVATIO) 20 MG tablet. Since pt missed his medication refill appointment I rescheduled him for tomorrow 06/08/16 at 11:00. Please advise thanks.

## 2016-06-08 ENCOUNTER — Encounter: Payer: Self-pay | Admitting: Family

## 2016-06-08 ENCOUNTER — Ambulatory Visit (INDEPENDENT_AMBULATORY_CARE_PROVIDER_SITE_OTHER): Payer: Medicare Other | Admitting: Family

## 2016-06-08 DIAGNOSIS — G35 Multiple sclerosis: Secondary | ICD-10-CM | POA: Diagnosis not present

## 2016-06-08 DIAGNOSIS — G8929 Other chronic pain: Secondary | ICD-10-CM | POA: Diagnosis not present

## 2016-06-08 DIAGNOSIS — F32A Depression, unspecified: Secondary | ICD-10-CM

## 2016-06-08 DIAGNOSIS — F329 Major depressive disorder, single episode, unspecified: Secondary | ICD-10-CM | POA: Diagnosis not present

## 2016-06-08 MED ORDER — AMPHETAMINE-DEXTROAMPHETAMINE 20 MG PO TABS: 20.0000 mg | ORAL_TABLET | Freq: Two times a day (BID) | ORAL | 0 refills | Status: DC

## 2016-06-08 MED ORDER — OXYCODONE HCL 10 MG PO TABS: 10.0000 mg | ORAL_TABLET | Freq: Three times a day (TID) | ORAL | 0 refills | Status: DC

## 2016-06-08 MED ORDER — DULOXETINE HCL 60 MG PO CPEP: 60.0000 mg | ORAL_CAPSULE | Freq: Every day | ORAL | 2 refills | Status: DC

## 2016-06-08 MED ORDER — OXYBUTYNIN CHLORIDE ER 5 MG PO TB24: 5.0000 mg | ORAL_TABLET | Freq: Every day | ORAL | 2 refills | Status: DC

## 2016-06-08 NOTE — Progress Notes (Signed)
Subjective:    Patient ID: Gabriel Burns, male    DOB: 07/31/1971, 45 y.o.   MRN: [id number]  Chief Complaint  Patient presents with  . Medication Refill    needs refill of all medications    HPI:  Gabriel Burns is a 45 y.o. male who  has a past medical history of COPD (chronic obstructive pulmonary disease) (HCC); Depression; and Multiple sclerosis (HCC). and presents today for an office follow up.   1 .) Chronic pain - Chronic pain located in multiple joints currently managed with oxycodone. Indicates he stopped taking the gabapentin. Pain severity is improved with medication, however he still continues to have difficulty with mobility. Has been working to fix up his grandmother's house able to complete some moderate level tasks.  Denies adverse side effects of current regimen or constipation.Has a pain medication appointment with pain management on 06/11/16. He also has appointment for a nerve block with anesthesia towards the end of September.  2.) Multiple Sclerosis - Currently maintained on gabapentin, and Cymbalata. States that he is no longer taking the Ampyra.  Reports taking the medication as prescribed and denies adverse side effects. Continues to take the Adderall  which helps with his energy levels. States that his symptoms are adequately controlled with Cymbalta.  Would like additional Adderall to help with his energy because later at night he feels exhausted. As noted above he is able to complete moderate level house repair with the current medications.   No Known Allergies   Outpatient Medications Prior to Visit  Medication Sig Dispense Refill  . sildenafil (REVATIO) 20 MG tablet Take 2 to 5 tabs (40 MG to 100 MG) prior to activity as needed. 50 tablet 0  . amphetamine-dextroamphetamine (ADDERALL) 20 MG tablet Take 1 tablet (20 mg total) by mouth 2 (two) times daily. 60 tablet 0  . bacitracin ointment Apply 1 application topically 2 (two) times daily. 15 g 0  .  dalfampridine (AMPYRA) 10 MG TB12 Take 1 tablet (10 mg total) by mouth 2 (two) times daily. 60 tablet 5  . DULoxetine (CYMBALTA) 60 MG capsule Take 1 capsule (60 mg total) by mouth daily. 30 capsule 2  . gabapentin (NEURONTIN) 300 MG capsule Take 1 capsule (300 mg total) by mouth 3 (three) times daily. 90 capsule 5  . oxybutynin (DITROPAN-XL) 5 MG 24 hr tablet Take 1 tablet (5 mg total) by mouth at bedtime. 30 tablet 2  . Oxycodone HCl 10 MG TABS Take 1 tablet (10 mg total) by mouth 2 (two) times daily. 60 tablet 0   No facility-administered medications prior to visit.     No past surgical history on file.    Past Medical History:  Diagnosis Date  . COPD (chronic obstructive pulmonary disease) (HCC)   . Depression   . Multiple sclerosis (HCC)     Review of Systems  Constitutional: Negative for chills and fever.  Respiratory: Negative for cough, chest tightness and wheezing.   Cardiovascular: Negative for chest pain, palpitations and leg swelling.  Gastrointestinal: Negative for constipation.  Musculoskeletal: Positive for arthralgias and back pain.  Neurological: Positive for weakness.      Objective:    BP (!) 160/92 (BP Location: Left Arm, Patient Position: Sitting, Cuff Size: Normal)   Pulse (!) 104   Temp 97.9 F (36.6 C) (Oral)   Resp 16   Ht 5\' 10"  (1.778 m)   Wt 163 lb (73.9 kg)   SpO2 99%   BMI 23.39  kg/m  Nursing note and vital signs reviewed.  Physical Exam  Constitutional: He is oriented to person, place, and time. He appears well-developed and well-nourished. No distress.  Cardiovascular: Normal rate, regular rhythm, normal heart sounds and intact distal pulses.   Pulmonary/Chest: Effort normal and breath sounds normal.  Neurological: He is alert and oriented to person, place, and time.  Skin: Skin is warm and dry.  Psychiatric: He has a normal mood and affect. His behavior is normal. Judgment and thought content normal.       Assessment & Plan:    Problem List Items Addressed This Visit      Nervous and Auditory   MS (multiple sclerosis) (HCC)    No longer taking Ampyra. His movement appears to be worsening as evidenced by watching him walk to today. Has follow up with neurology in October. Continue current dosage of Adderall for energy. [place] Controlled Substance Database reviewed with no irregularities. Continue to monitor.       Relevant Medications   amphetamine-dextroamphetamine (ADDERALL) 20 MG tablet     Other   Chronic pain    Chronic pain remains manageable as evidence of ability to create moderate level house repair. Increase oxycodone to 10 mg 3 times daily. Has appointment for pain management on 06/11/16 and nerve block scheduled for end of September. [place] Controlled Substance Database reviewed with no irregularities.       Relevant Medications   Oxycodone HCl 10 MG TABS   DULoxetine (CYMBALTA) 60 MG capsule   Depression   Relevant Medications   DULoxetine (CYMBALTA) 60 MG capsule    Other Visit Diagnoses   None.      I have discontinued Mr. Nemes's bacitracin, gabapentin, and dalfampridine. I have also changed his Oxycodone HCl. Additionally, I am having him maintain his sildenafil, amphetamine-dextroamphetamine, DULoxetine, and oxybutynin.   Meds ordered this encounter  Medications  . amphetamine-dextroamphetamine (ADDERALL) 20 MG tablet    Sig: Take 1 tablet (20 mg total) by mouth 2 (two) times daily.    Dispense:  60 tablet    Refill:  0    Order Specific Question:   Supervising Provider    Answer:   Hillard Danker A [[id number]]  . Oxycodone HCl 10 MG TABS    Sig: Take 1 tablet (10 mg total) by mouth 3 (three) times daily.    Dispense:  21 tablet    Refill:  0    Order Specific Question:   Supervising Provider    Answer:   Hillard Danker A [[id number]]  . DULoxetine (CYMBALTA) 60 MG capsule    Sig: Take 1 capsule (60 mg total) by mouth daily.    Dispense:  30 capsule    Refill:  2    Order  Specific Question:   Supervising Provider    Answer:   Hillard Danker A [[id number]]  . oxybutynin (DITROPAN-XL) 5 MG 24 hr tablet    Sig: Take 1 tablet (5 mg total) by mouth at bedtime.    Dispense:  30 tablet    Refill:  2    Order Specific Question:   Supervising Provider    Answer:   Hillard Danker A [[id number]]     Follow-up: Return in about 2 months (around 08/08/2016), or if symptoms worsen or fail to improve.  Jeanine Luz, FNP

## 2016-06-08 NOTE — Assessment & Plan Note (Signed)
No longer taking Ampyra. His movement appears to be worsening as evidenced by watching him walk to today. Has follow up with neurology in October. Continue current dosage of Adderall for energy.  Controlled Substance Database reviewed with no irregularities. Continue to monitor.

## 2016-06-08 NOTE — Patient Instructions (Signed)
Thank you for choosing [facility].  SUMMARY AND INSTRUCTIONS:  Please continue to take your medications as prescribed.   Follow up with pain management on 06/11/16.   Medication:  Your prescription(s) have been submitted to your pharmacy or been printed and provided for you. Please take as directed and contact our office if you believe you are having problem(s) with the medication(s) or have any questions.  Follow up:  If your symptoms worsen or fail to improve, please contact our office for further instruction, or in case of emergency go directly to the emergency room at the closest medical facility.

## 2016-06-08 NOTE — Assessment & Plan Note (Signed)
Chronic pain remains manageable as evidence of ability to create moderate level house repair. Increase oxycodone to 10 mg 3 times daily. Has appointment for pain management on 06/11/16 and nerve block scheduled for end of September. [place] Controlled Substance Database reviewed with no irregularities.

## 2016-06-11 ENCOUNTER — Encounter: Payer: Medicare Other | Attending: Physical Medicine & Rehabilitation | Admitting: Physical Medicine & Rehabilitation

## 2016-06-11 ENCOUNTER — Encounter: Payer: Self-pay | Admitting: Physical Medicine & Rehabilitation

## 2016-06-11 VITALS — BP 146/78 | HR 115 | Resp 14

## 2016-06-11 DIAGNOSIS — G35 Multiple sclerosis: Secondary | ICD-10-CM

## 2016-06-11 DIAGNOSIS — M545 Low back pain, unspecified: Secondary | ICD-10-CM

## 2016-06-11 DIAGNOSIS — R269 Unspecified abnormalities of gait and mobility: Secondary | ICD-10-CM | POA: Diagnosis not present

## 2016-06-11 DIAGNOSIS — G8929 Other chronic pain: Secondary | ICD-10-CM

## 2016-06-11 DIAGNOSIS — M5126 Other intervertebral disc displacement, lumbar region: Secondary | ICD-10-CM

## 2016-06-11 DIAGNOSIS — Z79899 Other long term (current) drug therapy: Secondary | ICD-10-CM

## 2016-06-11 DIAGNOSIS — Z5181 Encounter for therapeutic drug level monitoring: Secondary | ICD-10-CM | POA: Insufficient documentation

## 2016-06-11 DIAGNOSIS — G894 Chronic pain syndrome: Secondary | ICD-10-CM | POA: Diagnosis not present

## 2016-06-11 DIAGNOSIS — F329 Major depressive disorder, single episode, unspecified: Secondary | ICD-10-CM

## 2016-06-11 DIAGNOSIS — G8111 Spastic hemiplegia affecting right dominant side: Secondary | ICD-10-CM | POA: Diagnosis not present

## 2016-06-11 DIAGNOSIS — W19XXXA Unspecified fall, initial encounter: Secondary | ICD-10-CM

## 2016-06-11 DIAGNOSIS — F32A Depression, unspecified: Secondary | ICD-10-CM

## 2016-06-11 MED ORDER — BACLOFEN 10 MG PO TABS: 10.0000 mg | ORAL_TABLET | Freq: Three times a day (TID) | ORAL | 1 refills | Status: AC

## 2016-06-11 MED ORDER — METHOCARBAMOL 500 MG PO TABS: 500.0000 mg | ORAL_TABLET | Freq: Four times a day (QID) | ORAL | 1 refills | Status: DC | PRN
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
## 2016-06-22 ENCOUNTER — Telehealth: Payer: Self-pay

## 2016-06-22 LAB — TOXASSURE SELECT,+ANTIDEPR,UR

## 2016-06-22 NOTE — Telephone Encounter (Signed)
FYI-pt's UDS was positive for alcohol and THC.

## 2016-07-06 ENCOUNTER — Ambulatory Visit: Payer: Medicare Other | Attending: Physical Medicine & Rehabilitation

## 2016-07-06 DIAGNOSIS — M545 Low back pain: Secondary | ICD-10-CM | POA: Diagnosis not present

## 2016-07-06 DIAGNOSIS — R278 Other lack of coordination: Secondary | ICD-10-CM

## 2016-07-06 DIAGNOSIS — M25551 Pain in right hip: Secondary | ICD-10-CM | POA: Insufficient documentation

## 2016-07-06 DIAGNOSIS — M5417 Radiculopathy, lumbosacral region: Secondary | ICD-10-CM | POA: Diagnosis not present

## 2016-07-06 DIAGNOSIS — G8111 Spastic hemiplegia affecting right dominant side: Secondary | ICD-10-CM | POA: Insufficient documentation

## 2016-07-06 DIAGNOSIS — R2689 Other abnormalities of gait and mobility: Secondary | ICD-10-CM | POA: Diagnosis not present

## 2016-07-06 NOTE — Therapy (Signed)
[facility] [address] [place], [place], [address] Phone: [phone]   Fax:  [phone]  Physical Therapy Evaluation  Patient Details  Name: Gabriel Burns MRN: [id number] Date of Birth: [birth year] Referring Provider: Dr. Allena Katz  Encounter Date: 07/06/2016      PT End of Session - 07/06/16 1733    Visit Number 1   Number of Visits 17   Date for PT Re-Evaluation 09/04/16   Authorization Type Medicare: G-CODE EVERY 10TH VISIT.   PT Start Time 1147   PT Stop Time 1230   PT Time Calculation (min) 43 min   Activity Tolerance Patient tolerated treatment well   Behavior During Therapy WFL for tasks assessed/performed      Past Medical History:  Diagnosis Date  . COPD (chronic obstructive pulmonary disease) (HCC)   . Depression   . Multiple sclerosis (HCC)     History reviewed. No pertinent surgical history.  There were no vitals filed for this visit.       Subjective Assessment - 07/06/16 1201    Subjective Pt reports he was diagnosed with MS (primary progressive per pt report?) about 5 years and he's been having difficulty amb. more and more over the last year. Pt is the primary caregiver for his 3 children, ages 30, 56 and 13, as pt reports his wife left him 3 years ago. Pt has difficulty picking up R toes and his RLE is the affected side. Pt states that amb., balance, and strength is getting progressively worse but he's reluctant to use an AD as he is young.  Pt reported approx. 10-15 falls in the last six months. Pt is reluctant to use baclofen as he needs to function throughout the day in order to take care of his children.   Pertinent History hx of MS, depression, COPD   Patient Stated Goals Be IND enough to take care of his children   Currently in Pain? Yes   Pain Score 4    Pain Location Hip   Pain Orientation Right   Pain Descriptors / Indicators Sharp   Pain Type Chronic pain   Pain Onset More than  a month ago   Pain Frequency Constant   Aggravating Factors  movement   Pain Relieving Factors medication            [facility] PT Assessment - 07/06/16 1207      Assessment   Medical Diagnosis Chronic pain syndrome, spastic hemiplegia affecting R dominant side, MS, neurologic gait disorder, falls   Referring Provider Dr. Allena Katz   Onset Date/Surgical Date 07/07/15  pt reports difficulty walking began with MS dx,getting worse   Hand Dominance Right   Prior Therapy none     Precautions   Precautions Fall     Restrictions   Weight Bearing Restrictions No     Balance Screen   Has the patient fallen in the past 6 months Yes   How many times? 10-15   Has the patient had a decrease in activity level because of a fear of falling?  No   Is the patient reluctant to leave their home because of a fear of falling?  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 light touch in R UE and in RLE distal to knee. Pt reports intermittent N/T in R fingers and R knee. Pt reports he feels that R UE is getting colder than L UE.     Coordination   Gross Motor Movements are Fluid and Coordinated No   Fine Motor Movements are Fluid and Coordinated No   Coordination and Movement Description Decr.  speed during RAMs and finger to thumb on R side.     Tone   Assessment Location Right Lower Extremity;Left Lower Extremity     ROM / Strength   AROM / PROM / Strength AROM;Strength     AROM   Overall AROM  Deficits   Overall AROM Comments RUE shoulder flex/IR, ER AROM limited 2/2 weakness. R hip flex AROM limited 2/2 weakness.      Strength   Overall Strength Deficits   Overall Strength Comments LLE WFL. R hand grip strength decr. RLE: hip flex: 2/5, knee ext: 4/5, knee flex: 2+/5, ankle DF: 2/5, ankle PF: 2/5 and seated hip abd/add grossly 2+/5     Transfers   Transfers Sit to Stand;Stand to Sit   Sit to Stand 5: Supervision;With upper extremity assist;From chair/3-in-1   Stand to Sit 5: Supervision;With upper extremity assist;To chair/3-in-1     Ambulation/Gait   Ambulation/Gait Yes   Ambulation/Gait Assistance 5: Supervision   Ambulation/Gait Assistance Details No overt LOB, but incr. postural sway during turns.   Ambulation Distance (Feet) 200 Feet   Assistive device None  pt reluctant to use an AD due to young age   Gait Pattern Step-through pattern;Decreased stride length;Decreased hip/knee flexion - right;Decreased dorsiflexion - right;Decreased weight shift to right;Right circumduction;Decreased trunk rotation   Ambulation Surface Level;Indoor   Gait velocity 1.58ft/sec. no AD     Standardized Balance Assessment   Standardized Balance Assessment Dynamic Gait Index     Dynamic Gait Index   Level Surface Mild Impairment   Change in Gait Speed Mild Impairment   Gait with Horizontal Head Turns Mild Impairment   Gait with Vertical Head Turns Mild Impairment   Gait and Pivot Turn Moderate Impairment   Step Over Obstacle Mild Impairment   Step Around Obstacles Mild Impairment   Steps Mild Impairment   Total Score 15     RLE Tone   RLE Tone Modified Ashworth     RLE Tone   Modified Ashworth Scale for Grading Hypertonia RLE Slight increase in muscle tone, manifested  by a catch and release or by minimal resistance at the end of the range of motion when the affected part(s) is moved in flexion or extension     LLE Tone   LLE Tone Within Functional Limits                           PT Education - 07/06/16 1732    Education provided Yes   Education Details PT  discussed frequency/duration. PT educated pt on outcome measure results. PT educated pt on the benefits of using an AD during amb. in order to improve safety, IND, and energy conservation. PT educated pt that heat can incr. fatigue for pt's with MS.    Person(s) Educated Patient   Methods Explanation   Comprehension Verbalized understanding;Need further instruction          PT Short Term Goals - 07/06/16 1741      PT SHORT TERM GOAL #1   Title Pt will be IND in HEP to improve balance, strength, and flexibility and to reduce pain. TARGET DATE FOR ALL STGS: 08/03/16   Status New     PT SHORT TERM GOAL #2   Title Pt will improve gait speed with LRAD to >/=2.2616ft/sec to decr. falls risk.    Status New     PT SHORT TERM GOAL #3   Title Pt will amb. 500' with LRAD at MOD I level over even terrain to improve functional mobility.    Status New     PT SHORT TERM GOAL #4   Title Pt will improve DGI score from 15/24 to >/=18/24 to decr. falls risk.    Status New           PT Long Term Goals - 07/06/16 1743      PT LONG TERM GOAL #1   Title Pt will verbalize understanding of energy conservation techniques to decr. fatigue. TARGET DATE FOR ALL LTGS: 08/31/16   Status New     PT LONG TERM GOAL #2   Title Pt will verbalize understanding of fall risk prevention to reduce add'l falls.    Status New     PT LONG TERM GOAL #3   Title Pt will amb. 1000' over even/uneven terrain with LRAD to at MOD I level to improve functional mobility.    Status New     PT LONG TERM GOAL #4   Title Pt will improve gait speed to >/=2.262ft/sec with LRAD to safely amb. in the community.     Status New     PT LONG TERM GOAL #5   Title Pt will improve DGI score to >/=20/24 to decr. falls risk.    Status New               Plan - 07/06/16 1734    Clinical Impression Statement Pt is a pleasant 45y/o male presenting to [facility]PPT neuro for MS and chronic pain. Pt's PMH siginficant for the following: MS (primary progressive per pt), depression, COPD. Per Dr. Eliane DecreePatel's note, pt is not on medications for MS and he encouraged pt to f/u with neurologist. Pt presented with the following deficits: gait deviations, decr. endurance, impaired balance, decr. AROM, impaired coordination, decr. coordination, and incr. tone. Pt's gait speed and DGI score indicate pt is at risk for falls. Pt reports intermittent dizziness/lightheadedness but didn't experience dizziness during today's session. Pt would benefit from skilled PT to improve safety during functional mobility and improve IND.    Rehab Potential Good   Clinical Impairments Affecting Rehab Potential co-morbidities and incr. tone   PT Frequency 2x / week   PT Duration 8 weeks   PT Treatment/Interventions ADLs/Self Care Home Management;Biofeedback;DME Instruction;Gait training;Stair training;Functional mobility training;Therapeutic activities;Therapeutic exercise;Balance training;Neuromuscular re-education;Manual techniques;Orthotic Fit/Training;Patient/family education;Energy conservation;Vestibular   PT Next Visit Plan Initiate balance, flexibility and strengthening HEP. Trial R AFO during gait. Provide pt with MS society info and energy conservation technique and falls education handout.   Recommended  Other Services OT eval, pt will think about this   Consulted and Agree with Plan of Care Patient      Patient will benefit from skilled therapeutic intervention in order to improve the following deficits and impairments:  Abnormal gait, Decreased endurance, Impaired tone, Pain, Decreased strength, Decreased balance, Decreased mobility, Decreased  range of motion, Decreased coordination, Postural dysfunction, Impaired flexibility, Dizziness  Visit Diagnosis: Other abnormalities of gait and mobility - Plan: PT plan of care cert/re-cert  Spastic hemiplegia affecting right dominant side (HCC) - Plan: PT plan of care cert/re-cert  Other lack of coordination - Plan: PT plan of care cert/re-cert  Pain in right hip - Plan: PT plan of care cert/re-cert      G-Codes - 2016-07-26 1747    Functional Assessment Tool Used DGI: 15/24; gait speed no AD: 1.58ft/sec.   Functional Limitation Mobility: Walking and moving around   Mobility: Walking and Moving Around Current Status [phone]) At least 40 percent but less than 60 percent impaired, limited or restricted   Mobility: Walking and Moving Around Goal Status [phone]) At least 1 percent but less than 20 percent impaired, limited or restricted       Problem List Patient Active Problem List   Diagnosis Date Noted  . Erectile dysfunction 02/09/2016  . Depression 01/08/2016  . MS (multiple sclerosis) (HCC) 05/22/2015  . COPD (chronic obstructive pulmonary disease) (HCC) 05/22/2015  . Tobacco use disorder 05/22/2015  . Chronic pain 05/22/2015    Miller,Jennifer L 07-26-16, 5:48 PM  [facility] [address] [place], [place], [address] Phone: [phone]   Fax:  [phone]  Name: Gabriel Burns MRN: [id number] Date of Birth: 1970/12/02  Zerita Boers, PT,DPT 07/26/2016 5:49 PM Phone: [phone] Fax: [phone]

## 2016-07-07 ENCOUNTER — Telehealth: Payer: Self-pay | Admitting: *Deleted

## 2016-07-07 DIAGNOSIS — G8929 Other chronic pain: Secondary | ICD-10-CM

## 2016-07-07 DIAGNOSIS — G35 Multiple sclerosis: Secondary | ICD-10-CM

## 2016-07-07 MED ORDER — AMPHETAMINE-DEXTROAMPHETAMINE 20 MG PO TABS: 20.0000 mg | ORAL_TABLET | Freq: Two times a day (BID) | ORAL | 0 refills | Status: DC

## 2016-07-07 MED ORDER — OXYCODONE HCL 10 MG PO TABS: 10.0000 mg | ORAL_TABLET | Freq: Three times a day (TID) | ORAL | 0 refills | Status: DC

## 2016-07-07 NOTE — Telephone Encounter (Signed)
Requesting refills on pain med & Adderall...Gabriel Burns/lmb

## 2016-07-07 NOTE — Telephone Encounter (Signed)
Notified pt rx's ready for pick-up../lmb 

## 2016-07-07 NOTE — Telephone Encounter (Signed)
Medications printed for pick up.

## 2016-07-15 ENCOUNTER — Ambulatory Visit: Payer: Medicare Other | Attending: Physical Medicine & Rehabilitation | Admitting: Physical Therapy

## 2016-07-16 ENCOUNTER — Ambulatory Visit: Payer: Medicare Other | Admitting: Physical Therapy

## 2016-07-21 ENCOUNTER — Ambulatory Visit: Payer: Medicare Other | Admitting: Physical Therapy

## 2016-07-23 ENCOUNTER — Ambulatory Visit: Payer: Medicare Other | Admitting: Physical Medicine & Rehabilitation

## 2016-07-23 ENCOUNTER — Encounter: Payer: Medicare Other | Attending: Physical Medicine & Rehabilitation | Admitting: Registered Nurse

## 2016-07-23 ENCOUNTER — Ambulatory Visit: Payer: Medicare Other | Admitting: Physical Therapy

## 2016-07-23 DIAGNOSIS — G35 Multiple sclerosis: Secondary | ICD-10-CM | POA: Insufficient documentation

## 2016-07-23 DIAGNOSIS — Z5181 Encounter for therapeutic drug level monitoring: Secondary | ICD-10-CM | POA: Insufficient documentation

## 2016-07-23 DIAGNOSIS — Z79899 Other long term (current) drug therapy: Secondary | ICD-10-CM | POA: Insufficient documentation

## 2016-07-23 DIAGNOSIS — G894 Chronic pain syndrome: Secondary | ICD-10-CM | POA: Insufficient documentation

## 2016-07-23 DIAGNOSIS — R269 Unspecified abnormalities of gait and mobility: Secondary | ICD-10-CM | POA: Insufficient documentation

## 2016-07-23 DIAGNOSIS — G8111 Spastic hemiplegia affecting right dominant side: Secondary | ICD-10-CM | POA: Insufficient documentation

## 2016-07-26 ENCOUNTER — Ambulatory Visit: Payer: Medicare Other | Admitting: Physical Therapy

## 2016-07-28 ENCOUNTER — Ambulatory Visit: Payer: Medicare Other

## 2016-08-02 ENCOUNTER — Ambulatory Visit: Payer: Medicare Other | Admitting: Physical Therapy

## 2016-08-05 ENCOUNTER — Ambulatory Visit: Payer: Medicare Other | Admitting: Neurology

## 2016-08-05 ENCOUNTER — Telehealth: Payer: Self-pay | Admitting: *Deleted

## 2016-08-05 DIAGNOSIS — G894 Chronic pain syndrome: Secondary | ICD-10-CM

## 2016-08-05 DIAGNOSIS — G35 Multiple sclerosis: Secondary | ICD-10-CM

## 2016-08-05 NOTE — Telephone Encounter (Signed)
Left msg on triage stating will be out of his meds on Sunday 10/29 requesting refills Adderrall & Oxycodone...Raechel Chute/lmb

## 2016-08-06 ENCOUNTER — Ambulatory Visit: Payer: Medicare Other

## 2016-08-06 MED ORDER — OXYCODONE HCL 10 MG PO TABS: 10.0000 mg | ORAL_TABLET | Freq: Three times a day (TID) | ORAL | 0 refills | Status: DC

## 2016-08-06 MED ORDER — AMPHETAMINE-DEXTROAMPHETAMINE 20 MG PO TABS: 20.0000 mg | ORAL_TABLET | Freq: Two times a day (BID) | ORAL | 0 refills | Status: DC

## 2016-08-06 NOTE — Telephone Encounter (Signed)
Medication refilled

## 2016-08-06 NOTE — Telephone Encounter (Signed)
Pt call to check up on this request. Please advise he wants to pick it up before today over.

## 2016-08-06 NOTE — Telephone Encounter (Signed)
Rx is ready for pick up. The number listed on pts list is no longer in service.

## 2016-09-06 ENCOUNTER — Telehealth: Payer: Self-pay | Admitting: *Deleted

## 2016-09-06 DIAGNOSIS — G894 Chronic pain syndrome: Secondary | ICD-10-CM

## 2016-09-06 DIAGNOSIS — G35 Multiple sclerosis: Secondary | ICD-10-CM

## 2016-09-06 MED ORDER — OXYCODONE HCL 10 MG PO TABS: 10.0000 mg | ORAL_TABLET | Freq: Three times a day (TID) | ORAL | 0 refills | Status: DC

## 2016-09-06 MED ORDER — AMPHETAMINE-DEXTROAMPHETAMINE 20 MG PO TABS: 20.0000 mg | ORAL_TABLET | Freq: Two times a day (BID) | ORAL | 0 refills | Status: DC

## 2016-09-06 NOTE — Telephone Encounter (Signed)
Medications refilled. Will need follow up office visit for additional refills.

## 2016-09-06 NOTE — Telephone Encounter (Signed)
Left msg on triage requesting refills on his Adderral & Oxycodone...Raechel Chute

## 2016-09-07 NOTE — Telephone Encounter (Signed)
Notified pt w/Greg response place script up front for pick-up...Raechel Chute/lmb

## 2016-09-09 ENCOUNTER — Ambulatory Visit: Payer: Medicare Other | Admitting: Family

## 2016-09-16 ENCOUNTER — Ambulatory Visit: Payer: Medicare Other | Admitting: Family

## 2016-09-16 DIAGNOSIS — Z0289 Encounter for other administrative examinations: Secondary | ICD-10-CM

## 2016-09-28 ENCOUNTER — Other Ambulatory Visit: Payer: Self-pay | Admitting: Family

## 2016-09-28 DIAGNOSIS — N529 Male erectile dysfunction, unspecified: Secondary | ICD-10-CM

## 2016-10-05 ENCOUNTER — Telehealth: Payer: Self-pay | Admitting: *Deleted

## 2016-10-05 MED ORDER — DULOXETINE HCL 60 MG PO CPEP: 60.0000 mg | ORAL_CAPSULE | Freq: Every day | ORAL | 2 refills | Status: DC

## 2016-10-05 MED ORDER — OXYBUTYNIN CHLORIDE ER 5 MG PO TB24: 5.0000 mg | ORAL_TABLET | Freq: Every day | ORAL | 2 refills | Status: DC

## 2016-10-05 NOTE — Telephone Encounter (Signed)
Rec'd call pt requesting refills on adderrall, oxycodone, cymbalta, and oxybutynin...Raechel Chute

## 2016-10-05 NOTE — Telephone Encounter (Signed)
Adderall and oxycodone require office visit. All others refilled. He has no showed for last office visits.

## 2016-10-05 NOTE — Telephone Encounter (Signed)
Notified pt w/Greg response. Made apt for 10/08/16 for med f/u...Raechel Chute

## 2016-10-08 ENCOUNTER — Encounter: Payer: Self-pay | Admitting: Family

## 2016-10-08 ENCOUNTER — Ambulatory Visit (INDEPENDENT_AMBULATORY_CARE_PROVIDER_SITE_OTHER): Payer: Medicare Other | Admitting: Family

## 2016-10-08 VITALS — BP 138/88 | HR 92 | Temp 97.9°F | Resp 16 | Ht 70.0 in | Wt 173.0 lb

## 2016-10-08 DIAGNOSIS — F17200 Nicotine dependence, unspecified, uncomplicated: Secondary | ICD-10-CM

## 2016-10-08 DIAGNOSIS — F3342 Major depressive disorder, recurrent, in full remission: Secondary | ICD-10-CM

## 2016-10-08 DIAGNOSIS — G894 Chronic pain syndrome: Secondary | ICD-10-CM

## 2016-10-08 DIAGNOSIS — G35 Multiple sclerosis: Secondary | ICD-10-CM

## 2016-10-08 DIAGNOSIS — F1010 Alcohol abuse, uncomplicated: Secondary | ICD-10-CM | POA: Insufficient documentation

## 2016-10-08 MED ORDER — VARENICLINE TARTRATE 0.5 MG X 11 & 1 MG X 42 PO MISC: ORAL | 0 refills | Status: DC

## 2016-10-08 MED ORDER — AMPHETAMINE-DEXTROAMPHETAMINE 20 MG PO TABS: 20.0000 mg | ORAL_TABLET | Freq: Two times a day (BID) | ORAL | 0 refills | Status: DC

## 2016-10-08 MED ORDER — OXYCODONE HCL 10 MG PO TABS: 10.0000 mg | ORAL_TABLET | Freq: Three times a day (TID) | ORAL | 0 refills | Status: DC

## 2016-10-08 MED ORDER — VARENICLINE TARTRATE 1 MG PO TABS: 1.0000 mg | ORAL_TABLET | Freq: Two times a day (BID) | ORAL | 0 refills | Status: DC

## 2016-10-08 NOTE — Progress Notes (Signed)
Subjective:    Patient ID: Gabriel RailJoseph F Havrilla, male    DOB: 05/18/1971, 45 y.o.   MRN: [id number]  Chief Complaint  Patient presents with  . Medication Refill    oxycodone and adderall    HPI:  Gabriel Burns is a 45 y.o. male who  has a past medical history of COPD (chronic obstructive pulmonary disease) (HCC); Depression; and Multiple sclerosis (HCC). and presents today for a chronic pain follow up.  1.) Chronic pain - Currently managed with oxycodone. Reports taking the medications as prescribed and denies adverse side effects. Did receive epidural injections in September which he indicates helped for about 1 week. States that he "keeps messing his back up" but remains able to complete the activities that he needs. He has not had any follow ups with pain management. Indicates the medication seems to to pretty good. Does continue to take [facility] as well on occasion.   2.) Decreased energy / MS - Currently maintained on Adderall to help increase energy levels. Previously followed by neurology with the [facility] but indicates that he has "too much going on for him to complete that." Notes that his symptoms are currently manageable and the Adderall provides him energy. Eating and sleeping alright with the medications.   3.) Tobacco use - Interested in quitting smoking.  Previous was abstinent when he was in prison but notes that out here it is more difficult to control his tobacco use. Has not tried any other previous attempts.   4.) Anger issues - This is a new problem. Associated symptom of increased agitiation has been going on for the past several weeks where he describes having a short temper with things. He does endorse drinking "socially" about 6-12 beers per day. Has not tried to cut back recently and describes that it is really tough with everything that he has to do.    No Known Allergies    Outpatient Medications Prior to Visit    Medication Sig Dispense Refill  . DULoxetine (CYMBALTA) 60 MG capsule Take 1 capsule (60 mg total) by mouth daily. 30 capsule 2  . oxybutynin (DITROPAN-XL) 5 MG 24 hr tablet Take 1 tablet (5 mg total) by mouth at bedtime. 30 tablet 2  . sildenafil (REVATIO) 20 MG tablet Take 2 to 5 tabs (40 MG to 100 MG) prior to activity as needed. 50 tablet 0  . sildenafil (REVATIO) 20 MG tablet TAKE 2 TO 5 TABLETS BY MOUTH PRIOR TO ACTIVITY AS NEEDED. 20 tablet 2  . amphetamine-dextroamphetamine (ADDERALL) 20 MG tablet Take 1 tablet (20 mg total) by mouth 2 (two) times daily. 60 tablet 0  . Oxycodone HCl 10 MG TABS Take 1 tablet (10 mg total) by mouth 3 (three) times daily. 90 tablet 0  . methocarbamol (ROBAXIN) 500 MG tablet Take 1 tablet (500 mg total) by mouth every 6 (six) hours as needed for muscle spasms. (Patient not taking: Reported on 07/06/2016) 120 tablet 1   No facility-administered medications prior to visit.       No past surgical history on file.    Past Medical History:  Diagnosis Date  . COPD (chronic obstructive pulmonary disease) (HCC)   . Depression   . Multiple sclerosis (HCC)       Review of Systems  Constitutional: Positive for fatigue. Negative for chills and fever.  Respiratory: Negative for chest tightness and shortness of breath.   Cardiovascular: Negative for chest pain, palpitations and  leg swelling.  Musculoskeletal: Positive for back pain.  Psychiatric/Behavioral: Positive for agitation and behavioral problems. Negative for dysphoric mood, self-injury, sleep disturbance and suicidal ideas. The patient is not nervous/anxious.       Objective:    BP 138/88 (BP Location: Left Arm, Patient Position: Sitting, Cuff Size: Normal)   Pulse 92   Temp 97.9 F (36.6 C) (Oral)   Resp 16   Ht 5\' 10"  (1.778 m)   Wt 173 lb (78.5 kg)   SpO2 97%   BMI 24.82 kg/m  Nursing note and vital signs reviewed.  Physical Exam  Constitutional: He is oriented to person, place,  and time. He appears well-developed and well-nourished. No distress.  Cardiovascular: Normal rate, regular rhythm, normal heart sounds and intact distal pulses.   Pulmonary/Chest: Effort normal and breath sounds normal.  Musculoskeletal:  Low back - no obvious deformity, discoloration, or edema. Tenderness elicited across lumbar spine with no crepitus or deformity. Range of motion is limited in flexion and lateral bending secondary to discomfort. Positive straight leg raise and negative Faber's.   Neurological: He is alert and oriented to person, place, and time.  Skin: Skin is warm and dry.  Psychiatric: He has a normal mood and affect. His behavior is normal. Judgment and thought content normal.       Assessment & Plan:   Problem List Items Addressed This Visit      Nervous and Auditory   MS (multiple sclerosis) (HCC)    Continues to experience low energy secondary to his multiple sclerosis maintained on Adderall. [place] controlled substance database reviewed with no irregularities. Continue current dosage of Adderall. Neurology recommends follow-up with to cardiology specialist for his multiple sclerosis and indicates he is not ready for that further step at this point. Continue to follow-up with neurology as scheduled.      Relevant Medications   amphetamine-dextroamphetamine (ADDERALL) 20 MG tablet     Other   Tobacco use disorder    Patient indicates he is wishing to quit smoking. Discussed methods of tobacco cessation including medications, patches, gums, and inhalers. Patient wishes to pursue Chantix. Discussed medication administration and possible side effects. Follow-up in one month or sooner.      Chronic pain - Primary    Continues to experience chronic pain located in his lower back that is refractory to epidural injections lasting approximately one week. No adverse side effects of medication. He does appear to be functional as he is able to complete his activities  of daily living and work around the house. [place] controlled substance database reviewed with no irregularities. Continue current dosage of oxycodone. Due for urine drug screen at next office pickup.      Relevant Medications   Oxycodone HCl 10 MG TABS   Depression    Currently maintained on Cymbalta and question possible exacerbation of depression resulting in alcohol abuse and increased agitation. Recommend follow-up with psychology/psychiatry which patient declines at this time. Continue current dosage of Cymbalta. Denies suicidal ideations. Continue to monitor.      Alcohol abuse    Patient with alcohol abuse indicating he drinks approximately 6-12 beers per night. This is concerning given his medication regimen with oxycodone and Adderall. Emphasize importance of decreasing alcohol intake as this also may be contributing to his anger issues. He is in agreement and will work on decreasing alcohol intake. Continue to monitor.          I have discontinued Gabriel Burns's methocarbamol. I am  also having him start on varenicline and varenicline. Additionally, I am having him maintain his sildenafil, sildenafil, DULoxetine, oxybutynin, Oxycodone HCl, and amphetamine-dextroamphetamine.   Meds ordered this encounter  Medications  . Oxycodone HCl 10 MG TABS    Sig: Take 1 tablet (10 mg total) by mouth 3 (three) times daily.    Dispense:  90 tablet    Refill:  0    Order Specific Question:   Supervising Provider    Answer:   Hillard Danker A [[id number]]  . amphetamine-dextroamphetamine (ADDERALL) 20 MG tablet    Sig: Take 1 tablet (20 mg total) by mouth 2 (two) times daily.    Dispense:  60 tablet    Refill:  0    Order Specific Question:   Supervising Provider    Answer:   Hillard Danker A [[id number]]  . varenicline (CHANTIX STARTING MONTH PAK) 0.5 MG X 11 & 1 MG X 42 tablet    Sig: Take one 0.5 mg tablet by mouth once daily for 3 days, then increase to one 0.5 mg tablet twice  daily for 4 days, then increase to one 1 mg tablet twice daily.    Dispense:  53 tablet    Refill:  0    Order Specific Question:   Supervising Provider    Answer:   Hillard Danker A [[id number]]  . varenicline (CHANTIX CONTINUING MONTH PAK) 1 MG tablet    Sig: Take 1 tablet (1 mg total) by mouth 2 (two) times daily.    Dispense:  60 tablet    Refill:  0    Please fill at the completion of the starter pack.    Order Specific Question:   Supervising Provider    Answer:   Hillard Danker A [[id number]]     Follow-up: Return in about 1 month (around 11/08/2016), or if symptoms worsen or fail to improve.  Jeanine Luz, FNP

## 2016-10-08 NOTE — Assessment & Plan Note (Signed)
Patient indicates he is wishing to quit smoking. Discussed methods of tobacco cessation including medications, patches, gums, and inhalers. Patient wishes to pursue Chantix. Discussed medication administration and possible side effects. Follow-up in one month or sooner.

## 2016-10-08 NOTE — Assessment & Plan Note (Signed)
Currently maintained on Cymbalta and question possible exacerbation of depression resulting in alcohol abuse and increased agitation. Recommend follow-up with psychology/psychiatry which patient declines at this time. Continue current dosage of Cymbalta. Denies suicidal ideations. Continue to monitor.

## 2016-10-08 NOTE — Assessment & Plan Note (Signed)
Patient with alcohol abuse indicating he drinks approximately 6-12 beers per night. This is concerning given his medication regimen with oxycodone and Adderall. Emphasize importance of decreasing alcohol intake as this also may be contributing to his anger issues. He is in agreement and will work on decreasing alcohol intake. Continue to monitor.

## 2016-10-08 NOTE — Patient Instructions (Signed)
Thank you for choosing [facility].  SUMMARY AND INSTRUCTIONS:  Please work to decrease your alcohol intake.   Consider counseling / psychiatry to help with your anger management.   Medication:  Please continue to take your medications as prescribed.   Your prescription(s) have been submitted to your pharmacy or been printed and provided for you. Please take as directed and contact our office if you believe you are having problem(s) with the medication(s) or have any questions.  Follow up:  If your symptoms worsen or fail to improve, please contact our office for further instruction, or in case of emergency go directly to the emergency room at the closest medical facility.

## 2016-10-08 NOTE — Assessment & Plan Note (Signed)
Continues to experience chronic pain located in his lower back that is refractory to epidural injections lasting approximately one week. No adverse side effects of medication. He does appear to be functional as he is able to complete his activities of daily living and work around the house. [place] controlled substance database reviewed with no irregularities. Continue current dosage of oxycodone. Due for urine drug screen at next office pickup.

## 2016-10-08 NOTE — Assessment & Plan Note (Signed)
Continues to experience low energy secondary to his multiple sclerosis maintained on Adderall. [place] controlled substance database reviewed with no irregularities. Continue current dosage of Adderall. Neurology recommends follow-up with to cardiology specialist for his multiple sclerosis and indicates he is not ready for that further step at this point. Continue to follow-up with neurology as scheduled.

## 2016-10-27 ENCOUNTER — Ambulatory Visit: Payer: Medicare Other | Admitting: Family

## 2016-10-29 ENCOUNTER — Ambulatory Visit (INDEPENDENT_AMBULATORY_CARE_PROVIDER_SITE_OTHER): Payer: Medicare Other | Admitting: Family

## 2016-10-29 ENCOUNTER — Encounter: Payer: Self-pay | Admitting: Family

## 2016-10-29 VITALS — BP 122/80 | HR 106 | Temp 97.9°F | Resp 16 | Ht 70.0 in | Wt 174.0 lb

## 2016-10-29 DIAGNOSIS — M5431 Sciatica, right side: Secondary | ICD-10-CM

## 2016-10-29 MED ORDER — PREDNISONE 20 MG PO TABS: ORAL_TABLET | ORAL | 0 refills | Status: DC

## 2016-10-29 MED ORDER — GABAPENTIN 300 MG PO CAPS: 300.0000 mg | ORAL_CAPSULE | Freq: Every day | ORAL | 0 refills | Status: DC

## 2016-10-29 NOTE — Progress Notes (Signed)
Subjective:    Patient ID: Gabriel Burns, male    DOB: 1971/08/07, 46 y.o.   MRN: [id number]  Chief Complaint  Patient presents with  . nerve pain    has sciatic nerve problem and has pain shooting down right side and down leg x2 weeks    HPI:  MAEJOR ERVEN is a 46 y.o. male who  has a past medical history of COPD (chronic obstructive pulmonary disease) (HCC); Depression; and Multiple sclerosis (HCC). and presents today for an acute office visit.   This is a new problem. Associated symptom of neuropathic pain located in his right side and radiating down to his has been going on for about 2 weeks. Denies any new trauma or injury. Modifying factors include ice/heat and stretching. Describes that it is radiating from his right gluteal to his right calf. Denies any abnormal or increased activity. Does have weakness in the right leg from his MS. Pain is described as sharp/shooting.   No Known Allergies    Outpatient Medications Prior to Visit  Medication Sig Dispense Refill  . amphetamine-dextroamphetamine (ADDERALL) 20 MG tablet Take 1 tablet (20 mg total) by mouth 2 (two) times daily. 60 tablet 0  . DULoxetine (CYMBALTA) 60 MG capsule Take 1 capsule (60 mg total) by mouth daily. 30 capsule 2  . oxybutynin (DITROPAN-XL) 5 MG 24 hr tablet Take 1 tablet (5 mg total) by mouth at bedtime. 30 tablet 2  . Oxycodone HCl 10 MG TABS Take 1 tablet (10 mg total) by mouth 3 (three) times daily. 90 tablet 0  . sildenafil (REVATIO) 20 MG tablet Take 2 to 5 tabs (40 MG to 100 MG) prior to activity as needed. 50 tablet 0  . sildenafil (REVATIO) 20 MG tablet TAKE 2 TO 5 TABLETS BY MOUTH PRIOR TO ACTIVITY AS NEEDED. 20 tablet 2  . varenicline (CHANTIX CONTINUING MONTH PAK) 1 MG tablet Take 1 tablet (1 mg total) by mouth 2 (two) times daily. 60 tablet 0  . varenicline (CHANTIX STARTING MONTH PAK) 0.5 MG X 11 & 1 MG X 42 tablet Take one 0.5 mg tablet by mouth once daily for 3 days, then increase to  one 0.5 mg tablet twice daily for 4 days, then increase to one 1 mg tablet twice daily. 53 tablet 0   No facility-administered medications prior to visit.       No past surgical history on file.    Past Medical History:  Diagnosis Date  . COPD (chronic obstructive pulmonary disease) (HCC)   . Depression   . Multiple sclerosis (HCC)       Review of Systems  Constitutional: Negative for chills and fever.  Musculoskeletal: Positive for back pain.  Neurological: Positive for weakness. Negative for numbness.      Objective:    BP 122/80 (BP Location: Left Arm, Patient Position: Sitting, Cuff Size: Normal)   Pulse (!) 106   Temp 97.9 F (36.6 C) (Oral)   Resp 16   Ht 5\' 10"  (1.778 m)   Wt 174 lb (78.9 kg)   SpO2 97%   BMI 24.97 kg/m  Nursing note and vital signs reviewed.  Physical Exam  Constitutional: He is oriented to person, place, and time. He appears well-developed and well-nourished. No distress.  Cardiovascular: Normal rate, regular rhythm, normal heart sounds and intact distal pulses.   Pulmonary/Chest: Effort normal and breath sounds normal.  Musculoskeletal:  Low back - no obvious deformity, discoloration, or edema. Palpable tenderness along gluteus  maximus and her pharmacist with mild discomfort over lumbar paraspinal musculature. No deformity or crepitus noted. Range of motion decreased in all directions secondary to discomfort and weakness. Distal pulses and sensation are intact and appropriate. Positive straight leg raise. Unable to complete Pearlean Brownie secondary to weakness.  Neurological: He is alert and oriented to person, place, and time.  Skin: Skin is warm and dry.  Psychiatric: He has a normal mood and affect. His behavior is normal. Judgment and thought content normal.       Assessment & Plan:   Problem List Items Addressed This Visit      Nervous and Auditory   Sciatica of right side - Primary    Symptoms and exam consistent with sciatica or  possible. Former syndrome with no significant trauma. Treat conservatively with prednisone taper and gabapentin. Recommend ice/moist heat and home exercise therapy. This can be challenging as patient does have multiple sclerosis and this is his weak side. Follow-up in 3 weeks or sooner if symptoms worsen or do not improve.      Relevant Medications   predniSONE (DELTASONE) 20 MG tablet   gabapentin (NEURONTIN) 300 MG capsule       I am having Mr. Aldredge start on predniSONE and gabapentin. I am also having him maintain his sildenafil, sildenafil, DULoxetine, oxybutynin, Oxycodone HCl, amphetamine-dextroamphetamine, varenicline, and varenicline.   Meds ordered this encounter  Medications  . predniSONE (DELTASONE) 20 MG tablet    Sig: Take 3 tablets by mouth for 3 days then 2 tablets by mouth for 3 days then 1 tablet by mouth for 3 days.    Dispense:  18 tablet    Refill:  0    Order Specific Question:   Supervising Provider    Answer:   Hillard Danker A [[id number]]  . gabapentin (NEURONTIN) 300 MG capsule    Sig: Take 1 capsule (300 mg total) by mouth at bedtime.    Dispense:  14 capsule    Refill:  0    Order Specific Question:   Supervising Provider    Answer:   Hillard Danker A [[id number]]     Follow-up: Return in about 3 weeks (around 11/19/2016), or if symptoms worsen or fail to improve.  Jeanine Luz, FNP

## 2016-10-29 NOTE — Assessment & Plan Note (Signed)
Symptoms and exam consistent with sciatica or possible. Former syndrome with no significant trauma. Treat conservatively with prednisone taper and gabapentin. Recommend ice/moist heat and home exercise therapy. This can be challenging as patient does have multiple sclerosis and this is his weak side. Follow-up in 3 weeks or sooner if symptoms worsen or do not improve.

## 2016-10-29 NOTE — Patient Instructions (Addendum)
Thank you for choosing [facility].  SUMMARY AND INSTRUCTIONS:  Ice / moist heat x 20 minutes every 2 hours as needed and after activities.   Stretches and exercises throughout the day.  Medication:  Your prescription(s) have been submitted to your pharmacy or been printed and provided for you. Please take as directed and contact our office if you believe you are having problem(s) with the medication(s) or have any questions.  Follow up:  If your symptoms worsen or fail to improve, please contact our office for further instruction, or in case of emergency go directly to the emergency room at the closest medical facility.     Sciatica Rehab Ask your health care provider which exercises are safe for you. Do exercises exactly as told by your health care provider and adjust them as directed. It is normal to feel mild stretching, pulling, tightness, or discomfort as you do these exercises, but you should stop right away if you feel sudden pain or your pain gets worse.Do not begin these exercises until told by your health care provider. Stretching and range of motion exercises These exercises warm up your muscles and joints and improve the movement and flexibility of your hips and your back. These exercises also help to relieve pain, numbness, and tingling. Exercise A: Sciatic nerve glide 1. Sit in a chair with your head facing down toward your chest. Place your hands behind your back. Let your shoulders slump forward. 2. Slowly straighten one of your knees while you tilt your head back as if you are looking toward the ceiling. Only straighten your leg as far as you can without making your symptoms worse. 3. Hold for __________ seconds. 4. Slowly return to the starting position. 5. Repeat with your other leg. Repeat __________ times. Complete this exercise __________ times a day. Exercise B: Knee to chest with hip adduction and internal rotation 1. Lie on your back on a firm surface  with both legs straight. 2. Bend one of your knees and move it up toward your chest until you feel a gentle stretch in your lower back and buttock. Then, move your knee toward the shoulder that is on the opposite side from your leg.  Hold your leg in this position by holding onto the front of your knee. 3. Hold for __________ seconds. 4. Slowly return to the starting position. 5. Repeat with your other leg. Repeat __________ times. Complete this exercise __________ times a day. Exercise C: Prone extension on elbows 1. Lie on your abdomen on a firm surface. A bed may be too soft for this exercise. 2. Prop yourself up on your elbows. 3. Use your arms to help lift your chest up until you feel a gentle stretch in your abdomen and your lower back.  This will place some of your body weight on your elbows. If this is uncomfortable, try stacking pillows under your chest.  Your hips should stay down, against the surface that you are lying on. Keep your hip and back muscles relaxed. 4. Hold for __________ seconds. 5. Slowly relax your upper body and return to the starting position. Repeat __________ times. Complete this exercise __________ times a day. Strengthening exercises These exercises build strength and endurance in your back. Endurance is the ability to use your muscles for a long time, even after they get tired. Exercise D: Pelvic tilt 1. Lie on your back on a firm surface. Bend your knees and keep your feet flat. 2. Tense your abdominal muscles. Tip  your pelvis up toward the ceiling and flatten your lower back into the floor.  To help with this exercise, you may place a small towel under your lower back and try to push your back into the towel. 3. Hold for __________ seconds. 4. Let your muscles relax completely before you repeat this exercise. Repeat __________ times. Complete this exercise __________ times a day. Exercise E: Alternating arm and leg raises 1. Get on your hands and  knees on a firm surface. If you are on a hard floor, you may want to use padding to cushion your knees, such as an exercise mat. 2. Line up your arms and legs. Your hands should be below your shoulders, and your knees should be below your hips. 3. Lift your left leg behind you. At the same time, raise your right arm and straighten it in front of you.  Do not lift your leg higher than your hip.  Do not lift your arm higher than your shoulder.  Keep your abdominal and back muscles tight.  Keep your hips facing the ground.  Do not arch your back.  Keep your balance carefully, and do not hold your breath. 4. Hold for __________ seconds. 5. Slowly return to the starting position and repeat with your right leg and your left arm. Repeat __________ times. Complete this exercise __________ times a day. Posture and body mechanics   Body mechanics refers to the movements and positions of your body while you do your daily activities. Posture is part of body mechanics. Good posture and healthy body mechanics can help to relieve stress in your body's tissues and joints. Good posture means that your spine is in its natural S-curve position (your spine is neutral), your shoulders are pulled back slightly, and your head is not tipped forward. The following are general guidelines for applying improved posture and body mechanics to your everyday activities. Standing   When standing, keep your spine neutral and your feet about hip-width apart. Keep a slight bend in your knees. Your ears, shoulders, and hips should line up.  When you do a task in which you stand in one place for a long time, place one foot up on a stable object that is 2-4 inches (5-10 cm) high, such as a footstool. This helps keep your spine neutral. Sitting  When sitting, keep your spine neutral and keep your feet flat on the floor. Use a footrest, if necessary, and keep your thighs parallel to the floor. Avoid rounding your shoulders, and  avoid tilting your head forward.  When working at a desk or a computer, keep your desk at a height where your hands are slightly lower than your elbows. Slide your chair under your desk so you are close enough to maintain good posture.  When working at a computer, place your monitor at a height where you are looking straight ahead and you do not have to tilt your head forward or downward to look at the screen. Resting   When lying down and resting, avoid positions that are most painful for you.  If you have pain with activities such as sitting, bending, stooping, or squatting (flexion-based activities), lie in a position in which your body does not bend very much. For example, avoid curling up on your side with your arms and knees near your chest (fetal position).  If you have pain with activities such as standing for a long time or reaching with your arms (extension-based activities), lie with your spine in  a neutral position and bend your knees slightly. Try the following positions:  Lying on your side with a pillow between your knees.  Lying on your back with a pillow under your knees. Lifting   When lifting objects, keep your feet at least shoulder-width apart and tighten your abdominal muscles.  Bend your knees and hips and keep your spine neutral. It is important to lift using the strength of your legs, not your back. Do not lock your knees straight out.  Always ask for help to lift heavy or awkward objects. This information is not intended to replace advice given to you by your health care provider. Make sure you discuss any questions you have with your health care provider. Document Released: 09/27/2005 Document Revised: 06/03/2016 Document Reviewed: 06/13/2015 Elsevier Interactive Patient Education  2017 [employer].

## 2016-11-05 ENCOUNTER — Telehealth: Payer: Self-pay | Admitting: *Deleted

## 2016-11-05 DIAGNOSIS — G35 Multiple sclerosis: Secondary | ICD-10-CM

## 2016-11-05 DIAGNOSIS — G894 Chronic pain syndrome: Secondary | ICD-10-CM

## 2016-11-05 DIAGNOSIS — M5431 Sciatica, right side: Secondary | ICD-10-CM

## 2016-11-05 MED ORDER — OXYCODONE HCL 10 MG PO TABS: 10.0000 mg | ORAL_TABLET | Freq: Three times a day (TID) | ORAL | 0 refills | Status: DC

## 2016-11-05 MED ORDER — GABAPENTIN 300 MG PO CAPS: 300.0000 mg | ORAL_CAPSULE | Freq: Every day | ORAL | 2 refills | Status: DC

## 2016-11-05 MED ORDER — AMPHETAMINE-DEXTROAMPHETAMINE 20 MG PO TABS
20.0000 mg | ORAL_TABLET | Freq: Two times a day (BID) | ORAL | 0 refills | Status: DC
Start: 2016-11-05 — End: 2016-12-01

## 2016-11-05 NOTE — Telephone Encounter (Signed)
Done hardcopy to Corinne  

## 2016-11-05 NOTE — Telephone Encounter (Signed)
Given to lucy 

## 2016-11-05 NOTE — Telephone Encounter (Signed)
Left msg on triage yesterday afternoon stating needing refills on all medications. Sent maintenance pls advise on Adderrall & Oxycodone.Tammy Sours is out of the office pls advise...Raechel Chute

## 2016-11-05 NOTE — Telephone Encounter (Signed)
Notified pt rx ready for pick-up.../lmb 

## 2016-12-01 ENCOUNTER — Telehealth: Payer: Self-pay | Admitting: *Deleted

## 2016-12-01 DIAGNOSIS — G35 Multiple sclerosis: Secondary | ICD-10-CM

## 2016-12-01 DIAGNOSIS — G894 Chronic pain syndrome: Secondary | ICD-10-CM

## 2016-12-01 MED ORDER — OXYCODONE HCL 10 MG PO TABS: 10.0000 mg | ORAL_TABLET | Freq: Three times a day (TID) | ORAL | 0 refills | Status: DC

## 2016-12-01 MED ORDER — AMPHETAMINE-DEXTROAMPHETAMINE 20 MG PO TABS: 20.0000 mg | ORAL_TABLET | Freq: Two times a day (BID) | ORAL | 0 refills | Status: DC

## 2016-12-01 MED ORDER — OXYBUTYNIN CHLORIDE ER 5 MG PO TB24: 5.0000 mg | ORAL_TABLET | Freq: Every day | ORAL | 2 refills | Status: DC

## 2016-12-01 NOTE — Telephone Encounter (Signed)
Medications refilled and dated per previous refills.

## 2016-12-01 NOTE — Telephone Encounter (Signed)
Pt left msg on triage needing refills on his Adderrall, cymbalta, and oxybutynin...Raechel Chute

## 2016-12-01 NOTE — Telephone Encounter (Signed)
Notified pt rx ready for pick-up.../lmb 

## 2016-12-03 ENCOUNTER — Ambulatory Visit: Payer: Medicare Other | Admitting: Family

## 2016-12-05 ENCOUNTER — Other Ambulatory Visit: Payer: Self-pay | Admitting: Family

## 2016-12-05 DIAGNOSIS — N529 Male erectile dysfunction, unspecified: Secondary | ICD-10-CM

## 2016-12-31 ENCOUNTER — Telehealth: Payer: Self-pay | Admitting: *Deleted

## 2016-12-31 DIAGNOSIS — G894 Chronic pain syndrome: Secondary | ICD-10-CM

## 2016-12-31 DIAGNOSIS — G35 Multiple sclerosis: Secondary | ICD-10-CM

## 2016-12-31 NOTE — Telephone Encounter (Signed)
rec'd call pt requesting refills on his Adderrall & Oxycodone...Raechel Chute

## 2017-01-03 MED ORDER — OXYCODONE HCL 10 MG PO TABS: 10.0000 mg | ORAL_TABLET | Freq: Three times a day (TID) | ORAL | 0 refills | Status: DC

## 2017-01-03 MED ORDER — AMPHETAMINE-DEXTROAMPHETAMINE 20 MG PO TABS: 20.0000 mg | ORAL_TABLET | Freq: Two times a day (BID) | ORAL | 0 refills | Status: DC

## 2017-01-03 NOTE — Telephone Encounter (Signed)
Pls advise on msg below.../lmb 

## 2017-01-03 NOTE — Telephone Encounter (Signed)
Tried calling pt no answer can't leave msg due to vm not set-up.../lmb 

## 2017-01-03 NOTE — Telephone Encounter (Signed)
Medications refilled
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
## 2017-02-01 ENCOUNTER — Telehealth: Payer: Self-pay | Admitting: *Deleted

## 2017-02-01 NOTE — Telephone Encounter (Signed)
Requesting refills on his Adderral, Oxycodone, gabapentin, and cymbalta.Marland Kitchenlmb

## 2017-02-01 NOTE — Telephone Encounter (Signed)
Needs office visit for oxycodone and Adderall.

## 2017-02-02 NOTE — Telephone Encounter (Signed)
Patient called back. Informed he needed an appointment. He made one for tomorrow April 26 at 3pm.

## 2017-02-02 NOTE — Telephone Encounter (Signed)
Called pt no answer can't leave msg due to vm not set-up.../lmb 

## 2017-02-03 ENCOUNTER — Ambulatory Visit (INDEPENDENT_AMBULATORY_CARE_PROVIDER_SITE_OTHER): Payer: Medicare Other | Admitting: Family

## 2017-02-03 ENCOUNTER — Encounter: Payer: Self-pay | Admitting: Family

## 2017-02-03 VITALS — BP 128/74 | HR 90 | Temp 97.9°F | Resp 16 | Ht 70.0 in | Wt 176.6 lb

## 2017-02-03 DIAGNOSIS — G894 Chronic pain syndrome: Secondary | ICD-10-CM | POA: Diagnosis not present

## 2017-02-03 DIAGNOSIS — G35 Multiple sclerosis: Secondary | ICD-10-CM

## 2017-02-03 MED ORDER — OXYCODONE HCL 10 MG PO TABS: 10.0000 mg | ORAL_TABLET | Freq: Three times a day (TID) | ORAL | 0 refills | Status: DC

## 2017-02-03 MED ORDER — AMPHETAMINE-DEXTROAMPHETAMINE 20 MG PO TABS: 20.0000 mg | ORAL_TABLET | Freq: Two times a day (BID) | ORAL | 0 refills | Status: DC

## 2017-02-03 NOTE — Progress Notes (Signed)
Subjective:    Patient ID: Gabriel Burns, male    DOB: [birth year], 46 y.o.   MRN: [id number]  Chief Complaint  Patient presents with  . Medication Refill    adderall and oxycodone    HPI:  Gabriel Burns is a 46 y.o. male who  has a past medical history of COPD (chronic obstructive pulmonary disease) (HCC); Depression; and Multiple sclerosis (HCC). and presents today for a follow up office visit.   Chronic pain - Currently maintained on oxycodone. Reports taking the medication as prescribed and denies adverse side effects or constipation. Pain is adequately controlled and able to function with the current medication regimen. He is able to work on the house that he lives in.   Multiple Sclerosis - Continues to take Adderall twice daily that helps with his energy levels. Reports taking the medication as prescribed and denies adverse side effects. Adderall helps to improve his energy levels. Not currently following up with neurology or on medications at this time. Denies any new symptoms.    No Known Allergies    Outpatient Medications Prior to Visit  Medication Sig Dispense Refill  . DULoxetine (CYMBALTA) 60 MG capsule Take 1 capsule (60 mg total) by mouth daily. 30 capsule 2  . gabapentin (NEURONTIN) 300 MG capsule Take 1 capsule (300 mg total) by mouth at bedtime. 30 capsule 2  . oxybutynin (DITROPAN-XL) 5 MG 24 hr tablet Take 1 tablet (5 mg total) by mouth at bedtime. 30 tablet 2  . sildenafil (REVATIO) 20 MG tablet Take 2 to 5 tabs (40 MG to 100 MG) prior to activity as needed. 50 tablet 0  . amphetamine-dextroamphetamine (ADDERALL) 20 MG tablet Take 1 tablet (20 mg total) by mouth 2 (two) times daily. 60 tablet 0  . Oxycodone HCl 10 MG TABS Take 1 tablet (10 mg total) by mouth 3 (three) times daily. 90 tablet 0  . predniSONE (DELTASONE) 20 MG tablet Take 3 tablets by mouth for 3 days then 2 tablets by mouth for 3 days then 1 tablet by mouth for 3 days. 18 tablet 0  .  sildenafil (REVATIO) 20 MG tablet TAKE 2 TO 5 TABLETS BY MOUTH PRIOR TO ACTIVITY AS NEEDED 10 tablet 5  . varenicline (CHANTIX CONTINUING MONTH PAK) 1 MG tablet Take 1 tablet (1 mg total) by mouth 2 (two) times daily. 60 tablet 0  . varenicline (CHANTIX STARTING MONTH PAK) 0.5 MG X 11 & 1 MG X 42 tablet Take one 0.5 mg tablet by mouth once daily for 3 days, then increase to one 0.5 mg tablet twice daily for 4 days, then increase to one 1 mg tablet twice daily. 53 tablet 0   No facility-administered medications prior to visit.     Review of Systems  Constitutional: Negative for appetite change, chills, diaphoresis, fatigue, fever and unexpected weight change.  Respiratory: Negative for chest tightness and shortness of breath.   Cardiovascular: Negative for chest pain, palpitations and leg swelling.  Neurological: Negative for dizziness and light-headedness.  Psychiatric/Behavioral: Negative for decreased concentration, sleep disturbance and suicidal ideas. The patient is not nervous/anxious and is not hyperactive.       Objective:    BP 128/74 (BP Location: Left Arm, Patient Position: Sitting, Cuff Size: Normal)   Pulse 90   Temp 97.9 F (36.6 C) (Oral)   Resp 16   Ht 5\' 10"  (1.778 m)   Wt 176 lb 9.6 oz (80.1 kg)   SpO2 97%   BMI  25.34 kg/m  Nursing note and vital signs reviewed.  Physical Exam  Constitutional: He is oriented to person, place, and time. He appears well-developed and well-nourished. No distress.  Cardiovascular: Normal rate, regular rhythm, normal heart sounds and intact distal pulses.   Pulmonary/Chest: Effort normal and breath sounds normal.  Neurological: He is alert and oriented to person, place, and time.  Skin: Skin is warm and dry.  Psychiatric: He has a normal mood and affect. His behavior is normal. Judgment and thought content normal.       Assessment & Plan:   Problem List Items Addressed This Visit      Nervous and Auditory   MS (multiple  sclerosis) (HCC) - Primary    Appears stable with no new symptoms and not currently maintained on medication for disease management. Discussed follow up with neurology. Continue current dosage of Adderall for energy. [place] Controlled Substance Database reviewed with no irregularities. Continue to monitor.       Relevant Medications   amphetamine-dextroamphetamine (ADDERALL) 20 MG tablet     Other   Chronic pain    Chronic pain associated with back and MS appears adequately controlled with no adverse side effects. He continues to remain functional. New controlled substance contract signed. . Continue current dosage of oxycodone. Due for UDS at next pick up.       Relevant Medications   Oxycodone HCl 10 MG TABS       I have discontinued Mr. Zaino's varenicline, varenicline, and predniSONE. I am also having him maintain his sildenafil, DULoxetine, gabapentin, oxybutynin, Oxycodone HCl, and amphetamine-dextroamphetamine.   Meds ordered this encounter  Medications  . Oxycodone HCl 10 MG TABS    Sig: Take 1 tablet (10 mg total) by mouth 3 (three) times daily.    Dispense:  90 tablet    Refill:  0    Order Specific Question:   Supervising Provider    Answer:   Hillard Danker A [[id number]]  . amphetamine-dextroamphetamine (ADDERALL) 20 MG tablet    Sig: Take 1 tablet (20 mg total) by mouth 2 (two) times daily.    Dispense:  60 tablet    Refill:  0    Order Specific Question:   Supervising Provider    Answer:   Hillard Danker A [[id number]]     Follow-up: Return in about 3 months (around 05/05/2017), or if symptoms worsen or fail to improve.  Jeanine Luz, FNP

## 2017-02-03 NOTE — Assessment & Plan Note (Addendum)
Chronic pain associated with back and MS appears adequately controlled with no adverse side effects. He continues to remain functional. New controlled substance contract signed. . Continue current dosage of oxycodone. Due for UDS at next pick up.

## 2017-02-03 NOTE — Assessment & Plan Note (Signed)
Appears stable with no new symptoms and not currently maintained on medication for disease management. Discussed follow up with neurology. Continue current dosage of Adderall for energy. [place] Controlled Substance Database reviewed with no irregularities. Continue to monitor.

## 2017-02-03 NOTE — Patient Instructions (Addendum)
Thank you for choosing [facility].  SUMMARY AND INSTRUCTIONS:  Please continue to take your medication as prescribed.   Medication:  Your prescription(s) have been submitted to your pharmacy or been printed and provided for you. Please take as directed and contact our office if you believe you are having problem(s) with the medication(s) or have any questions.  Follow up:  If your symptoms worsen or fail to improve, please contact our office for further instruction, or in case of emergency go directly to the emergency room at the closest medical facility.   CONTROLLED SUBSTANCES:  You are responsible for taking the medications as prescribed and maintaining the security of your medication. Early refills or loss of medication WILL NOT BE APPROVED/REFILLED.  You may be drug tested at any time. If you test positive for a medication / substance that is not prescribed, you will no longer be prescribed any controlled substances. We are more than happy to continue to provide primary care services.   You are anticipated to be seen in the office at least every 3 months.

## 2017-02-21 IMAGING — CT CT CERVICAL SPINE W/O CM
3 of 5 series · 13 of 33 positions shown, 16 images · non-contrast
Comparison: 03/23/2015

CLINICAL DATA: Fall with head injury. Forehead laceration. Initial
encounter.

EXAM:
CT HEAD WITHOUT CONTRAST
CT CERVICAL SPINE WITHOUT CONTRAST
TECHNIQUE: Multidetector CT imaging of the head and cervical spine was
performed following the standard protocol without intravenous
contrast. Multiplanar CT image reconstructions of the cervical spine
were also generated.

[Series 5: c-spine st · axial · 0.27mm/px · z∈[-325,-197]mm · 5 of 96 slices shown, 7 images]
[im 16/96  soft-tissue]
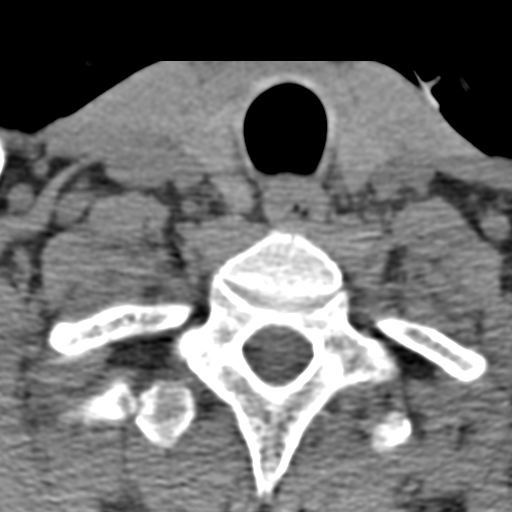
[im 16/96  bone]
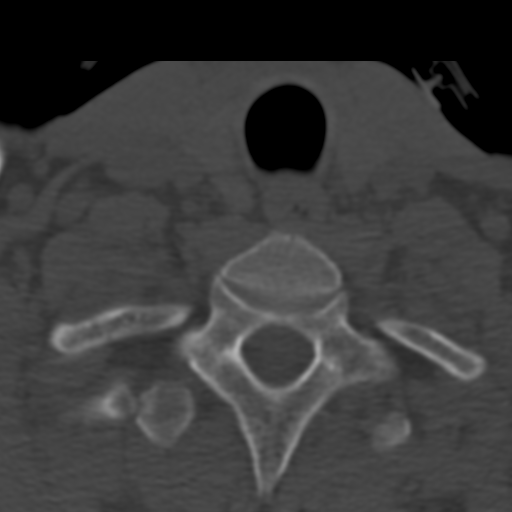
[im 32/96  bone]
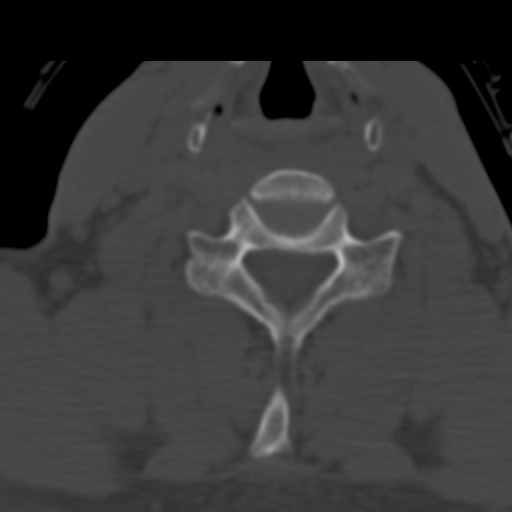
[im 48/96  bone]
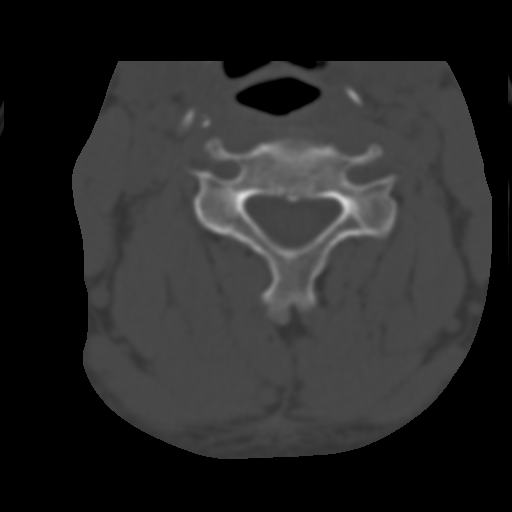
[im 64/96  bone]
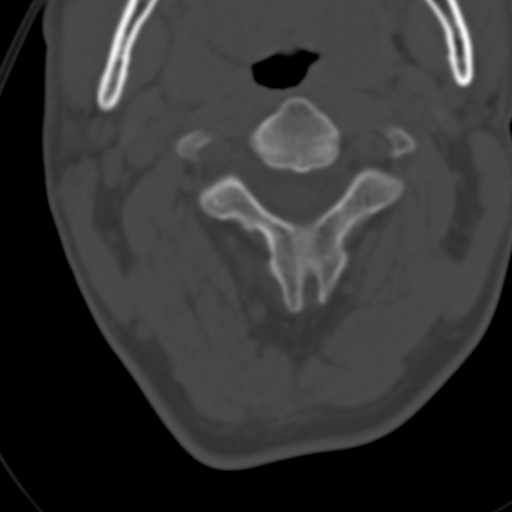
[im 80/96  soft-tissue]
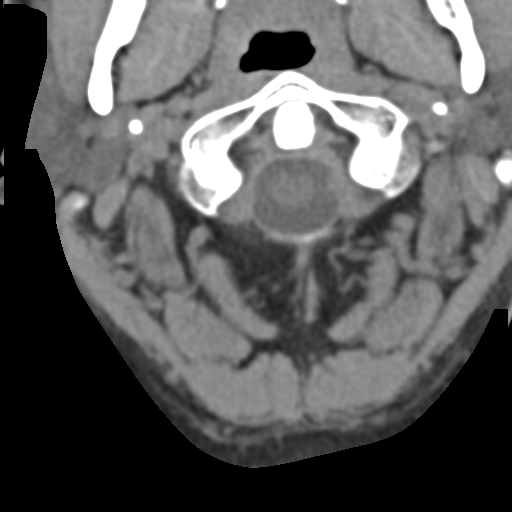
[im 80/96  bone]
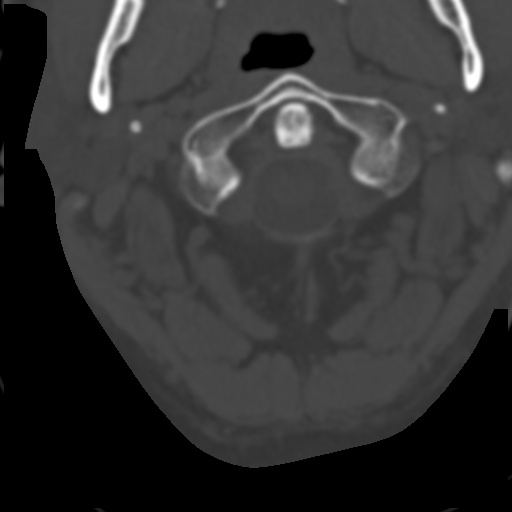

[Series 9: coronal · coronal · 0.28mm/px · 3 of 42 slices shown]
[im 9/42  bone]
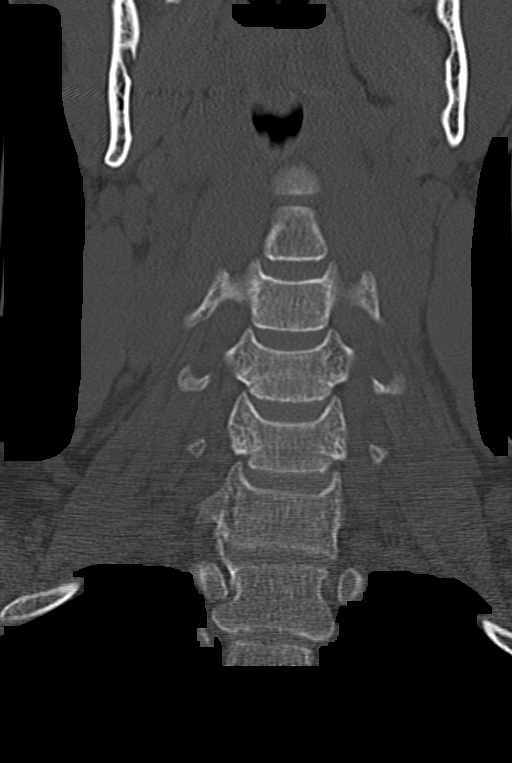
[im 17/42  bone]
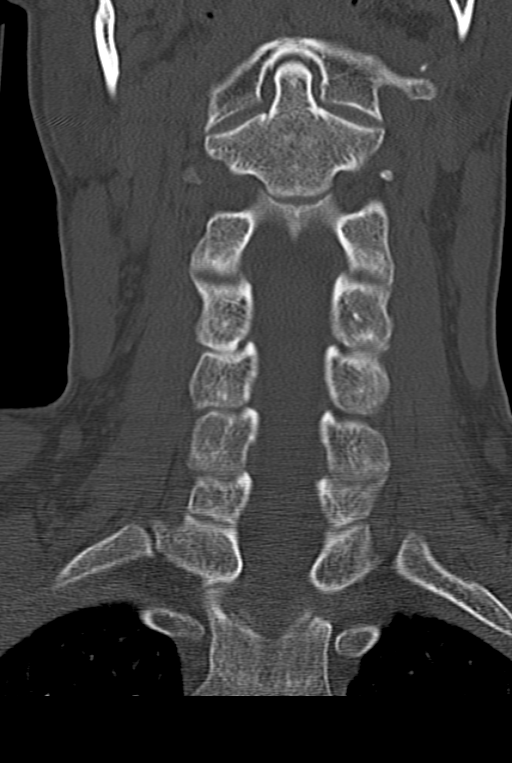
[im 25/42  bone]
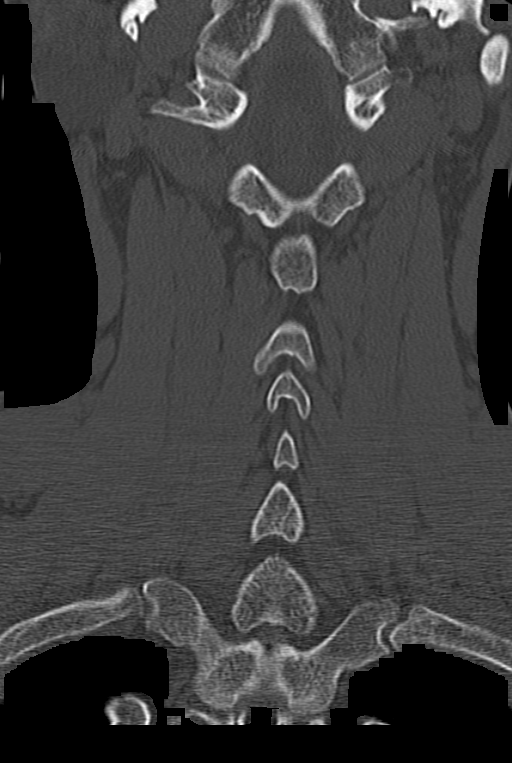

[Series 10: sagittal · sagittal · 0.28mm/px · 5 of 33 slices shown, 6 images]
[im 11/33  bone]
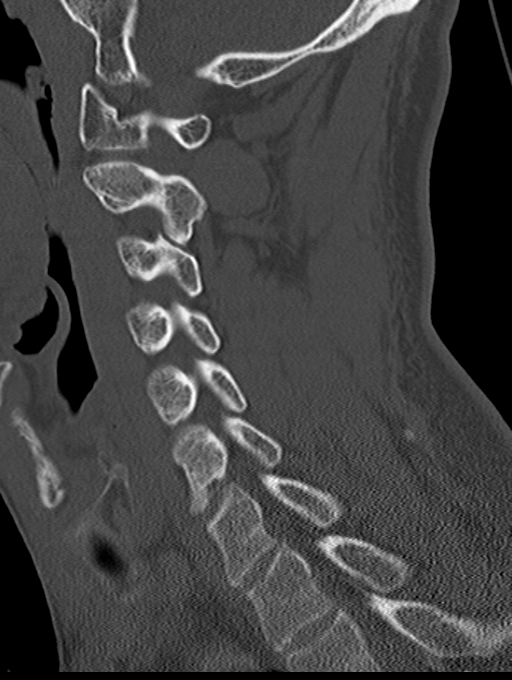
[im 14/33  bone]
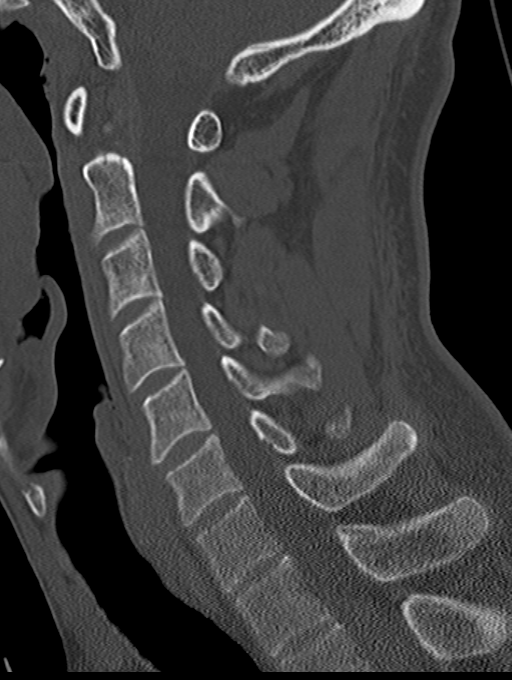
[im 17/33  soft-tissue]
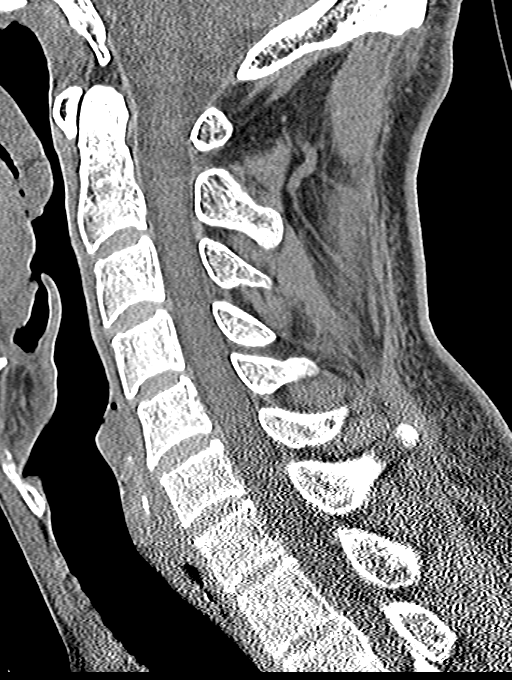
[im 17/33  bone]
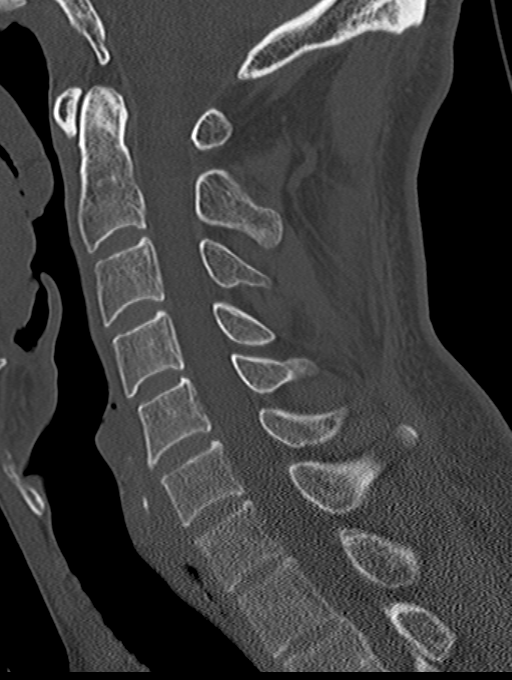
[im 19/33  bone]
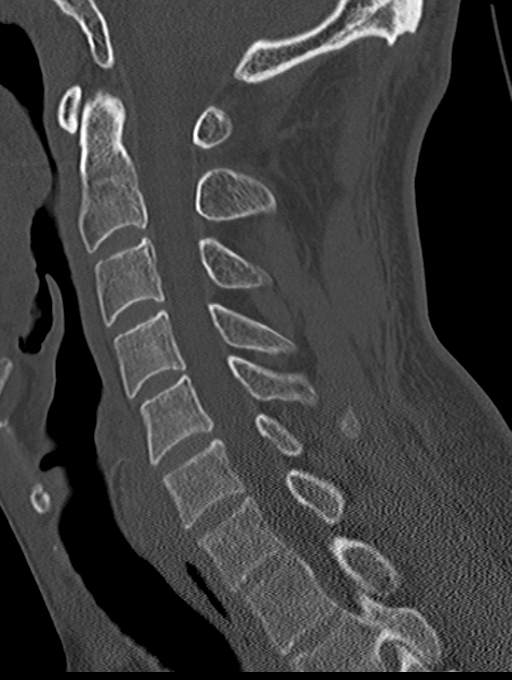
[im 22/33  bone]
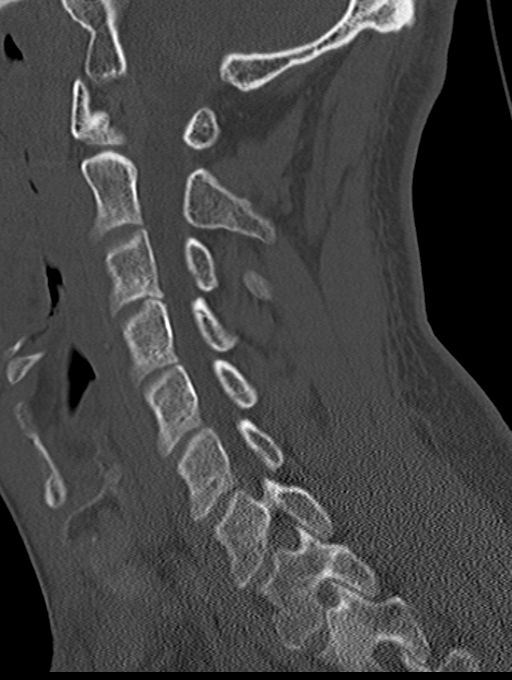

[13 of 33 positions shown; findings below may reference images not displayed]

FINDINGS: CT HEAD FINDINGS

Skull and Sinuses:Deep laceration above the right orbit without
opaque foreign body or neighboring fracture.

Bilateral nasal arch fractures with depression at the level of the
anterior process maxilla on the right. Lucency through the posterior
nasal septum is chronic.

Chronic sinusitis with retained secretions and fluid levels
throughout the paranasal sinuses, pattern stable from 1 month ago.

Orbits: No acute abnormality.

Brain: No evidence of acute infarction, hemorrhage, hydrocephalus,
or mass lesion/mass effect. Stable bilateral cerebral white matter
low-density foci, best seen periventricular in the left parietal
region, correlating with multiple sclerosis history. Low cerebral
volume for age.

CT CERVICAL SPINE FINDINGS

Negative for acute fracture or subluxation. No prevertebral edema.
No gross cervical canal hematoma. No significant osseous canal or
foraminal stenosis.
IMPRESSION: 1. No evidence of intracranial or cervical spine injury.
2. Bilateral nasal arch fractures with depression on the right.
3. Right supraorbital laceration without opaque foreign body.
4. Cerebral white matter disease correlating with history of
multiple sclerosis.
5. Chronic sinusitis.

## 2017-03-03 ENCOUNTER — Telehealth: Payer: Self-pay | Admitting: *Deleted

## 2017-03-03 DIAGNOSIS — G35 Multiple sclerosis: Secondary | ICD-10-CM

## 2017-03-03 DIAGNOSIS — G894 Chronic pain syndrome: Secondary | ICD-10-CM

## 2017-03-03 MED ORDER — OXYCODONE HCL 10 MG PO TABS: 10.0000 mg | ORAL_TABLET | Freq: Three times a day (TID) | ORAL | 0 refills | Status: DC

## 2017-03-03 MED ORDER — AMPHETAMINE-DEXTROAMPHETAMINE 20 MG PO TABS
20.0000 mg | ORAL_TABLET | Freq: Two times a day (BID) | ORAL | 0 refills | Status: DC
Start: 2017-03-03 — End: 2017-04-11

## 2017-03-03 NOTE — Telephone Encounter (Signed)
Medications refilled

## 2017-03-03 NOTE — Telephone Encounter (Signed)
Rec'd call pt requesting to pick -up rx Adderrall & Oxycodone...Gabriel Burns

## 2017-03-03 NOTE — Telephone Encounter (Signed)
Notified pt rx ready for pick-up.../lmb 

## 2017-03-04 ENCOUNTER — Other Ambulatory Visit: Payer: Self-pay | Admitting: Family

## 2017-03-04 DIAGNOSIS — N529 Male erectile dysfunction, unspecified: Secondary | ICD-10-CM

## 2017-03-29 ENCOUNTER — Encounter (HOSPITAL_COMMUNITY): Payer: Self-pay | Admitting: Nurse Practitioner

## 2017-03-29 ENCOUNTER — Emergency Department (HOSPITAL_COMMUNITY)
Admission: EM | Admit: 2017-03-29 | Discharge: 2017-03-29 | Disposition: A | Discharge status: Home / Self Care | Payer: Medicare Other | Attending: Emergency Medicine | Admitting: Emergency Medicine

## 2017-03-29 DIAGNOSIS — Z5321 Procedure and treatment not carried out due to patient leaving prior to being seen by health care provider: Secondary | ICD-10-CM | POA: Diagnosis not present

## 2017-03-29 DIAGNOSIS — M549 Dorsalgia, unspecified: Secondary | ICD-10-CM | POA: Insufficient documentation

## 2017-03-29 NOTE — ED Triage Notes (Signed)
Pt presents with c/o mid back pain. The pain began last week after he slipped on wet floor and fell. He describes the pain as a constant stabbing that is worse with movement and breathing. He has been applying ice to his back with no relief. He denies any numbness, tingling, bowel or bladder changes. He has a history of MS and reports increasing falls recently but does not want to use assistive devices.

## 2017-04-04 ENCOUNTER — Ambulatory Visit (INDEPENDENT_AMBULATORY_CARE_PROVIDER_SITE_OTHER): Payer: Medicare Other | Admitting: Family

## 2017-04-04 ENCOUNTER — Encounter: Payer: Self-pay | Admitting: Family

## 2017-04-04 ENCOUNTER — Ambulatory Visit (INDEPENDENT_AMBULATORY_CARE_PROVIDER_SITE_OTHER)
Admission: RE | Admit: 2017-04-04 | Discharge: 2017-04-04 | Disposition: A | Discharge status: Home / Self Care | Payer: Medicare Other | Source: Ambulatory Visit | Attending: Family | Admitting: Family

## 2017-04-04 VITALS — BP 118/80 | HR 88 | Temp 98.1°F | Resp 16 | Ht 70.0 in | Wt 171.8 lb

## 2017-04-04 DIAGNOSIS — M5126 Other intervertebral disc displacement, lumbar region: Secondary | ICD-10-CM

## 2017-04-04 DIAGNOSIS — M546 Pain in thoracic spine: Secondary | ICD-10-CM

## 2017-04-04 DIAGNOSIS — G894 Chronic pain syndrome: Secondary | ICD-10-CM

## 2017-04-04 DIAGNOSIS — M5136 Other intervertebral disc degeneration, lumbar region: Secondary | ICD-10-CM | POA: Insufficient documentation

## 2017-04-04 MED ORDER — OXYCODONE HCL 10 MG PO TABS: 10.0000 mg | ORAL_TABLET | Freq: Three times a day (TID) | ORAL | 0 refills | Status: DC

## 2017-04-04 NOTE — Assessment & Plan Note (Signed)
Previously imaged and noted to have bulging lumbar disc located at L3-L4. Patient requesting referral to general surgery for further interventions and treatments. Referral placed. Continue conservative treatment with ice, and home exercise therapy.

## 2017-04-04 NOTE — Progress Notes (Signed)
Subjective:    Patient ID: Gabriel Burns, male    DOB: [birth year], 46 y.o.   MRN: [id number]  Chief Complaint  Patient presents with  . Back Pain    wants to look into getting back surgery, not seeing anyone for his back right now, fell a couple of weeks ago and thinks he damaged something in his back, refill of pain med wants to go ahead and take the rx today     HPI:  Gabriel Burns is a 46 y.o. male who  has a past medical history of COPD (chronic obstructive pulmonary disease) (HCC); Depression; and Multiple sclerosis (HCC). and presents today for a follow up office visit.  Continues to experience the associated symptom of pain located in his back that was recently exacerbated by a fall. Describes falling backward striking his back on the car. Severity was he was unable to get our of bed for 2-3 days. Coughing and moving bothered him. Pain is described as sharp. There is occasion that coughing can send him to his knees. The course of the pain has improved since initial onset. He attempted to be seen in the ED but left secondary to the weight. No having difficulty with comfort. No radiculopathy. Modifying factors include pain medication which help a little. Does have previous history of herniated disk. ref  No Known Allergies    Outpatient Medications Prior to Visit  Medication Sig Dispense Refill  . amphetamine-dextroamphetamine (ADDERALL) 20 MG tablet Take 1 tablet (20 mg total) by mouth 2 (two) times daily. 60 tablet 0  . DULoxetine (CYMBALTA) 60 MG capsule Take 1 capsule (60 mg total) by mouth daily. 30 capsule 2  . gabapentin (NEURONTIN) 300 MG capsule Take 1 capsule (300 mg total) by mouth at bedtime. 30 capsule 2  . oxybutynin (DITROPAN-XL) 5 MG 24 hr tablet Take 1 tablet (5 mg total) by mouth at bedtime. 30 tablet 2  . sildenafil (REVATIO) 20 MG tablet Take 2 to 5 tabs (40 MG to 100 MG) prior to activity as needed. 50 tablet 0  . sildenafil (REVATIO) 20 MG tablet TAKE 2  TO 5 TABLETS BY MOUTH PRIOR TO ACTIVITY AS NEEDED 90 tablet 0  . Oxycodone HCl 10 MG TABS Take 1 tablet (10 mg total) by mouth 3 (three) times daily. 90 tablet 0   No facility-administered medications prior to visit.       No past surgical history on file.    Past Medical History:  Diagnosis Date  . COPD (chronic obstructive pulmonary disease) (HCC)   . Depression   . Multiple sclerosis (HCC)     Review of Systems  Constitutional: Negative for chills and fever.  Respiratory: Negative for chest tightness and shortness of breath.   Cardiovascular: Negative for chest pain, palpitations and leg swelling.  Musculoskeletal: Positive for back pain.  Neurological: Negative for weakness and numbness.      Objective:    BP 118/80 (BP Location: Left Arm, Patient Position: Sitting, Cuff Size: Large)   Pulse 88   Temp 98.1 F (36.7 C) (Oral)   Resp 16   Ht 5\' 10"  (1.778 m)   Wt 171 lb 12.8 oz (77.9 kg)   SpO2 98%   BMI 24.65 kg/m  Nursing note and vital signs reviewed.  Physical Exam  Constitutional: He is oriented to person, place, and time. He appears well-developed and well-nourished. No distress.  Cardiovascular: Normal rate, regular rhythm, normal heart sounds and intact distal pulses.  Pulmonary/Chest: Effort normal and breath sounds normal.  Musculoskeletal:  Thoracic spine/lumbar spine - no obvious deformity or discoloration with mild edema located on the right mid thoracic spine. Range of motion slightly decreased secondary to pain and discomfort. Distal pulses and sensation are intact and appropriate. Negative straight leg raise and negative Faber's.  Neurological: He is alert and oriented to person, place, and time.  Skin: Skin is warm and dry.  Psychiatric: He has a normal mood and affect. His behavior is normal. Judgment and thought content normal.       Assessment & Plan:   Problem List Items Addressed This Visit      Musculoskeletal and Integument    Bulging lumbar disc    Previously imaged and noted to have bulging lumbar disc located at L3-L4. Patient requesting referral to general surgery for further interventions and treatments. Referral placed. Continue conservative treatment with ice, and home exercise therapy.      Relevant Orders   Ambulatory referral to Neurosurgery   DG Thoracic Spine W/Swimmers (Completed)     Other   Chronic pain    Chronic pain stable with current dosage of oxycodone and no adverse side effects or constipation. Continues to be able to complete activities of daily living including working on his house. Continue current dosage of oxycodone. [place] controlled substance database reviewed with no irregularities.      Relevant Medications   Oxycodone HCl 10 MG TABS   Acute right-sided thoracic back pain - Primary    New-onset acute right-sided thoracic back pain following a fall against his car with mild edema and concern for possible fracture. Obtain x-rays. No urinary symptoms. Treat conservatively with ice and home exercise therapy. Continue current dosage of oxycodone only as needed for extreme pain. Follow-up pending x-ray results or sooner if symptoms worsen or do not improve.      Relevant Medications   Oxycodone HCl 10 MG TABS       I am having Gabriel Burns maintain his sildenafil, DULoxetine, gabapentin, oxybutynin, amphetamine-dextroamphetamine, sildenafil, and Oxycodone HCl.   Meds ordered this encounter  Medications  . Oxycodone HCl 10 MG TABS    Sig: Take 1 tablet (10 mg total) by mouth 3 (three) times daily.    Dispense:  90 tablet    Refill:  0    Order Specific Question:   Supervising Provider    Answer:   Hillard Danker A [[id number]]     Follow-up: Return in about 1 month (around 05/04/2017), or if symptoms worsen or fail to improve.  Jeanine Luz, FNP

## 2017-04-04 NOTE — Assessment & Plan Note (Signed)
New-onset acute right-sided thoracic back pain following a fall against his car with mild edema and concern for possible fracture. Obtain x-rays. No urinary symptoms. Treat conservatively with ice and home exercise therapy. Continue current dosage of oxycodone only as needed for extreme pain. Follow-up pending x-ray results or sooner if symptoms worsen or do not improve.

## 2017-04-04 NOTE — Assessment & Plan Note (Signed)
Chronic pain stable with current dosage of oxycodone and no adverse side effects or constipation. Continues to be able to complete activities of daily living including working on his house. Continue current dosage of oxycodone. [place] controlled substance database reviewed with no irregularities.

## 2017-04-04 NOTE — Patient Instructions (Signed)
Thank you for choosing [facility].  SUMMARY AND INSTRUCTIONS:  Ice 20 minutes every 2 hours and as needed for discomfort.  We will check your x-rays today.  Continue to take your medication as prescribed.  They will call with your referral to neurosurgery.  Follow-up if her symptoms worsen or do not improve.  Medication:  Your prescription(s) have been submitted to your pharmacy or been printed and provided for you. Please take as directed and contact our office if you believe you are having problem(s) with the medication(s) or have any questions.  Follow up:  If your symptoms worsen or fail to improve, please contact our office for further instruction, or in case of emergency go directly to the emergency room at the closest medical facility.

## 2017-04-08 ENCOUNTER — Telehealth: Payer: Self-pay | Admitting: *Deleted

## 2017-04-08 DIAGNOSIS — G35 Multiple sclerosis: Secondary | ICD-10-CM

## 2017-04-08 NOTE — Telephone Encounter (Signed)
Rec'd call pt requesting refill on his Adderrall.../lmb 

## 2017-04-11 MED ORDER — AMPHETAMINE-DEXTROAMPHETAMINE 20 MG PO TABS: 20.0000 mg | ORAL_TABLET | Freq: Two times a day (BID) | ORAL | 0 refills | Status: DC

## 2017-04-11 NOTE — Telephone Encounter (Signed)
Medication sent.

## 2017-04-11 NOTE — Telephone Encounter (Signed)
Tried calling pt no answer x's 10 rings, can't leave vm vm not set-up, but if pt call back pls inform rx ready for pick-up...Gabriel Burns

## 2017-04-11 NOTE — Telephone Encounter (Signed)
Notified pt rx ready for pick-up.../lmb 

## 2017-04-11 NOTE — Telephone Encounter (Signed)
Pls advise on refill.../lmb 

## 2017-05-04 ENCOUNTER — Telehealth: Payer: Self-pay | Admitting: Family

## 2017-05-04 DIAGNOSIS — G35 Multiple sclerosis: Secondary | ICD-10-CM

## 2017-05-04 DIAGNOSIS — G894 Chronic pain syndrome: Secondary | ICD-10-CM

## 2017-05-04 MED ORDER — OXYCODONE HCL 10 MG PO TABS: 10.0000 mg | ORAL_TABLET | Freq: Three times a day (TID) | ORAL | 0 refills | Status: DC

## 2017-05-04 MED ORDER — AMPHETAMINE-DEXTROAMPHETAMINE 20 MG PO TABS: 20.0000 mg | ORAL_TABLET | Freq: Two times a day (BID) | ORAL | 0 refills | Status: DC

## 2017-05-04 NOTE — Telephone Encounter (Signed)
NCCSD reviewed with no irregularities. Medications dated and refilled.

## 2017-05-04 NOTE — Telephone Encounter (Signed)
Check [place] registry Oxycodone last filled 04/04/17, and Adderall last filled 04/11/17...Raechel Chute

## 2017-05-04 NOTE — Telephone Encounter (Signed)
Oxycodone HCl 10 MG TABS   amphetamine-dextroamphetamine (ADDERALL) 20 MG tablet   Patient is requesting a refill on these medication. He knows its too soon for the adderall but he would like to pick them up on the same day. He is asking if you could date it out. Please advise. Thank you.

## 2017-05-05 DIAGNOSIS — M5416 Radiculopathy, lumbar region: Secondary | ICD-10-CM | POA: Diagnosis not present

## 2017-05-05 NOTE — Telephone Encounter (Signed)
Called pt x's 2 no answer can't leave msg due to VM not set-up. Will call back, but is pt does call rx is ready for pick-up. Place in cabinet,,,/lmb

## 2017-05-21 ENCOUNTER — Other Ambulatory Visit: Payer: Self-pay | Admitting: Family

## 2017-05-21 DIAGNOSIS — N529 Male erectile dysfunction, unspecified: Secondary | ICD-10-CM

## 2017-05-26 DIAGNOSIS — M5416 Radiculopathy, lumbar region: Secondary | ICD-10-CM | POA: Diagnosis not present

## 2017-05-26 DIAGNOSIS — M5023 Other cervical disc displacement, cervicothoracic region: Secondary | ICD-10-CM | POA: Diagnosis not present

## 2017-06-01 DIAGNOSIS — M5127 Other intervertebral disc displacement, lumbosacral region: Secondary | ICD-10-CM | POA: Diagnosis not present

## 2017-06-01 DIAGNOSIS — M5416 Radiculopathy, lumbar region: Secondary | ICD-10-CM | POA: Diagnosis not present

## 2017-06-01 DIAGNOSIS — R030 Elevated blood-pressure reading, without diagnosis of hypertension: Secondary | ICD-10-CM | POA: Diagnosis not present

## 2017-06-01 DIAGNOSIS — Z6832 Body mass index (BMI) 32.0-32.9, adult: Secondary | ICD-10-CM | POA: Diagnosis not present

## 2017-06-03 ENCOUNTER — Telehealth: Payer: Self-pay | Admitting: Family

## 2017-06-03 DIAGNOSIS — G35 Multiple sclerosis: Secondary | ICD-10-CM

## 2017-06-03 DIAGNOSIS — G894 Chronic pain syndrome: Secondary | ICD-10-CM

## 2017-06-03 NOTE — Telephone Encounter (Signed)
Check [place] registry last filled for Adderrall was 05/12/17, and Oxycodone 05/05/17.  Will forward to [place] desktop to address on Monday when he return back in office...Raechel Chute

## 2017-06-03 NOTE — Telephone Encounter (Signed)
Pt called requesting a refill on amphetamine-dextroamphetamine (ADDERALL) 20 MG tablet and Oxycodone HCl 10 MG TABS.

## 2017-06-07 MED ORDER — OXYCODONE HCL 10 MG PO TABS: 10.0000 mg | ORAL_TABLET | Freq: Three times a day (TID) | ORAL | 0 refills | Status: DC

## 2017-06-07 MED ORDER — AMPHETAMINE-DEXTROAMPHETAMINE 20 MG PO TABS: 20.0000 mg | ORAL_TABLET | Freq: Two times a day (BID) | ORAL | 0 refills | Status: DC

## 2017-06-07 NOTE — Telephone Encounter (Signed)
Patient is calling to follow up. Please advise. Thank you.

## 2017-06-07 NOTE — Telephone Encounter (Signed)
Medication printed to be picked up. 

## 2017-06-07 NOTE — Telephone Encounter (Signed)
Pt called checking on the status of this refill. He would like a call when it is completed [phone]).

## 2017-06-07 NOTE — Addendum Note (Signed)
Addended by: Jeanine Luz D on: 06/07/2017 10:58 PM   Modules accepted: Orders

## 2017-06-24 DIAGNOSIS — M5126 Other intervertebral disc displacement, lumbar region: Secondary | ICD-10-CM | POA: Diagnosis not present

## 2017-06-24 DIAGNOSIS — M5116 Intervertebral disc disorders with radiculopathy, lumbar region: Secondary | ICD-10-CM | POA: Diagnosis not present

## 2017-07-06 ENCOUNTER — Telehealth: Payer: Self-pay | Admitting: Family

## 2017-07-06 DIAGNOSIS — G894 Chronic pain syndrome: Secondary | ICD-10-CM

## 2017-07-06 DIAGNOSIS — G35 Multiple sclerosis: Secondary | ICD-10-CM

## 2017-07-06 MED ORDER — AMPHETAMINE-DEXTROAMPHETAMINE 20 MG PO TABS: 20.0000 mg | ORAL_TABLET | Freq: Two times a day (BID) | ORAL | 0 refills | Status: DC

## 2017-07-06 NOTE — Telephone Encounter (Signed)
Pt called requesting a refill on Oxycodone HCl 10 MG TABS and amphetamine-dextroamphetamine (ADDERALL) 20 MG tablet

## 2017-07-06 NOTE — Telephone Encounter (Signed)
Check [place] registry last filled PAIN MED 06/24/17 & ADDERRAL last filled on 06/11/17...Raechel Chute

## 2017-07-06 NOTE — Telephone Encounter (Signed)
Adderall refilled. It appears he got a secondary prescription for oxycodone and I am not able to see any documentation as to why. Will await response.

## 2017-07-06 NOTE — Telephone Encounter (Signed)
Notified pt w/Gabriel Burns response, pt states he just had back surgery and was give Oxycodone 7.5 5 day supply, and his other pain med that is a 20 mg which is rx by Tammy Sours. He will be due for refill on 9/28 (Friday)...Raechel Chute

## 2017-07-08 MED ORDER — OXYCODONE HCL 10 MG PO TABS: 10.0000 mg | ORAL_TABLET | Freq: Three times a day (TID) | ORAL | 0 refills | Status: DC

## 2017-07-08 NOTE — Telephone Encounter (Signed)
Pt called regarding this, would like a call back when ready to be picked up.

## 2017-07-08 NOTE — Addendum Note (Signed)
Addended by: Jeanine Luz D on: 07/08/2017 04:24 PM   Modules accepted: Orders

## 2017-07-08 NOTE — Telephone Encounter (Signed)
Medication refilled

## 2017-07-11 NOTE — Telephone Encounter (Signed)
Tried calling pt no answer & can't leave msg due to vm not set-up. If pt calls back rx is ready for pick-ip...Raechel Chute

## 2017-07-24 HISTORY — PX: LUMBAR LAMINECTOMY: SHX95

## 2017-08-05 DIAGNOSIS — G9619 Other disorders of meninges, not elsewhere classified: Secondary | ICD-10-CM | POA: Diagnosis not present

## 2017-08-05 DIAGNOSIS — G9782 Other postprocedural complications and disorders of nervous system: Secondary | ICD-10-CM | POA: Diagnosis not present

## 2017-08-05 DIAGNOSIS — R51 Headache: Secondary | ICD-10-CM | POA: Diagnosis not present

## 2017-08-11 ENCOUNTER — Telehealth: Payer: Self-pay | Admitting: Family

## 2017-08-11 ENCOUNTER — Ambulatory Visit: Payer: Medicare Other | Admitting: Nurse Practitioner

## 2017-08-11 DIAGNOSIS — N529 Male erectile dysfunction, unspecified: Secondary | ICD-10-CM

## 2017-08-11 DIAGNOSIS — G35 Multiple sclerosis: Secondary | ICD-10-CM

## 2017-08-11 DIAGNOSIS — M5431 Sciatica, right side: Secondary | ICD-10-CM

## 2017-08-11 DIAGNOSIS — G96198 Other disorders of meninges, not elsewhere classified: Secondary | ICD-10-CM

## 2017-08-11 HISTORY — DX: Other disorders of meninges, not elsewhere classified: G96.198

## 2017-08-11 NOTE — Telephone Encounter (Signed)
Pt called requesting a refill on the following prescriptions: sildenafil (REVATIO) 20 MG tablet amphetamine-dextroamphetamine (ADDERALL) 20 MG tablet gabapentin (NEURONTIN) 300 MG capsule oxybutynin (DITROPAN-XL) 5 MG 24 hr tablet Oxycodone HCl 10 MG TABS [facility]. He is scheduled to see Ashleigh tomorrow but he said that he is completely out of these medications and the pharmacy has sent over requests but has not heard anything back.

## 2017-08-11 NOTE — Telephone Encounter (Signed)
Pt must see Ashleigh for his refills. All are controls and must have OV per changing in [place] laws.Have apt tomorrow can wait....Gabriel Burns

## 2017-08-12 ENCOUNTER — Ambulatory Visit (INDEPENDENT_AMBULATORY_CARE_PROVIDER_SITE_OTHER): Payer: Medicare HMO | Admitting: Nurse Practitioner

## 2017-08-12 ENCOUNTER — Other Ambulatory Visit (INDEPENDENT_AMBULATORY_CARE_PROVIDER_SITE_OTHER): Payer: Medicare HMO

## 2017-08-12 ENCOUNTER — Ambulatory Visit: Payer: Medicare Other | Admitting: Nurse Practitioner

## 2017-08-12 ENCOUNTER — Encounter: Payer: Self-pay | Admitting: Nurse Practitioner

## 2017-08-12 VITALS — BP 122/82 | HR 89 | Temp 97.9°F | Ht 70.0 in | Wt 163.0 lb

## 2017-08-12 DIAGNOSIS — G894 Chronic pain syndrome: Secondary | ICD-10-CM

## 2017-08-12 DIAGNOSIS — G35 Multiple sclerosis: Secondary | ICD-10-CM

## 2017-08-12 DIAGNOSIS — F3342 Major depressive disorder, recurrent, in full remission: Secondary | ICD-10-CM | POA: Diagnosis not present

## 2017-08-12 DIAGNOSIS — M5126 Other intervertebral disc displacement, lumbar region: Secondary | ICD-10-CM

## 2017-08-12 DIAGNOSIS — R69 Illness, unspecified: Secondary | ICD-10-CM | POA: Diagnosis not present

## 2017-08-12 DIAGNOSIS — R52 Pain, unspecified: Secondary | ICD-10-CM | POA: Diagnosis not present

## 2017-08-12 DIAGNOSIS — N3946 Mixed incontinence: Secondary | ICD-10-CM

## 2017-08-12 DIAGNOSIS — L7682 Other postprocedural complications of skin and subcutaneous tissue: Secondary | ICD-10-CM | POA: Diagnosis not present

## 2017-08-12 DIAGNOSIS — N529 Male erectile dysfunction, unspecified: Secondary | ICD-10-CM

## 2017-08-12 DIAGNOSIS — M5136 Other intervertebral disc degeneration, lumbar region: Secondary | ICD-10-CM

## 2017-08-12 LAB — CBC WITH DIFFERENTIAL/PLATELET
BASOS PCT: 0.7 % (ref 0.0–3.0)
Basophils Absolute: 0.1 10*3/uL (ref 0.0–0.1)
EOS ABS: 0.3 10*3/uL (ref 0.0–0.7)
EOS PCT: 4.5 % (ref 0.0–5.0)
HEMATOCRIT: 44.3 % (ref 39.0–52.0)
HEMOGLOBIN: 15 g/dL (ref 13.0–17.0)
LYMPHS PCT: 30.2 % (ref 12.0–46.0)
Lymphs Abs: 2.3 10*3/uL (ref 0.7–4.0)
MCHC: 33.8 g/dL (ref 30.0–36.0)
MCV: 99.1 fl (ref 78.0–100.0)
MONOS PCT: 11.8 % (ref 3.0–12.0)
Monocytes Absolute: 0.9 10*3/uL (ref 0.1–1.0)
NEUTROS ABS: 4 10*3/uL (ref 1.4–7.7)
Neutrophils Relative %: 52.8 % (ref 43.0–77.0)
PLATELETS: 228 10*3/uL (ref 150.0–400.0)
RBC: 4.47 Mil/uL (ref 4.22–5.81)
RDW: 13.6 % (ref 11.5–15.5)
WBC: 7.7 10*3/uL (ref 4.0–10.5)

## 2017-08-12 LAB — SEDIMENTATION RATE: Sed Rate: 6 mm/hr (ref 0–15)

## 2017-08-12 MED ORDER — OXYBUTYNIN CHLORIDE ER 5 MG PO TB24: 5.0000 mg | ORAL_TABLET | Freq: Every day | ORAL | 0 refills | Status: DC

## 2017-08-12 MED ORDER — AMPHETAMINE-DEXTROAMPHETAMINE 20 MG PO TABS: 20.0000 mg | ORAL_TABLET | Freq: Every day | ORAL | 0 refills | Status: DC

## 2017-08-12 MED ORDER — GABAPENTIN 300 MG PO CAPS: 300.0000 mg | ORAL_CAPSULE | Freq: Every day | ORAL | 0 refills | Status: DC

## 2017-08-12 MED ORDER — OXYCODONE HCL 10 MG PO TABS: 10.0000 mg | ORAL_TABLET | Freq: Three times a day (TID) | ORAL | 0 refills | Status: DC

## 2017-08-12 MED ORDER — SILDENAFIL CITRATE 20 MG PO TABS: ORAL_TABLET | ORAL | 0 refills | Status: DC

## 2017-08-12 NOTE — Patient Instructions (Addendum)
Please contact neurosurgery for further evaluation of incision.  You will be called with schedule appt with pain clinic.  I advised patient of that I do not manage chronic pain, hence he will need to get further pain medications from pain clinic. I also advised him to refrain from ETOH or any illicit drug use while taking controlled substances. This is also a violation of pain contract. This can also lead to respiratory depression and death. He verbalized understanding and agreed to f/up with pain clinic, and neurosurgeon.

## 2017-08-12 NOTE — Progress Notes (Signed)
Subjective:  Patient ID: Gabriel Burns, male    DOB: [birth year]  Age: 46 y.o. MRN: [id number]  CC: Follow-up (routine 3 month follow up for pain, also has a bulge on his back that showed up 3 days after surgery does not hurt to the touch but he is concerend )  HPI  Indication for chronic opioid: back pain Rate Pain (1-10 scale):10/10 Medication and dose: oxycodone 74m # pills per month: 90 Last UDS date: 06/2016 Pain contract signed (Y/N): yes Date narcotic database last reviewed (include red flags): today, no red flags. He states pain is under control with use of oxycodone. He admits to previous ETOH abuse, but states he has since quit. He states he has not had any ETOH or any other eillicit drug since last year.  He also reports he had lower back surgery 06/24/2017 by [facility] Developed bulge around incision, worsening back and pelvic pain since surgery. Denies any fever or chill or change in GI/GU function. States he was instructed to wear back brace but never did. States he has appt with neurosurgery in 2weeks  Depression: He states he is not depressed, hence does not need cymbalta.  MS: He has occassional urinary incontinence without use of ditropan. He did not return to neurology as recommended. States he does not want to take any of the recommended medications. He is willing to continue gabapentin and ditropan for now. He states he was initially prescribed Adderrall to improve energy level and mood. [place]: Use of revatio as needed.  Outpatient Medications Prior to Visit  Medication Sig Dispense Refill  . amphetamine-dextroamphetamine (ADDERALL) 20 MG tablet Take 1 tablet (20 mg total) by mouth 2 (two) times daily. 60 tablet 0  . gabapentin (NEURONTIN) 300 MG capsule Take 1 capsule (300 mg total) by mouth at bedtime. 30 capsule 2  . oxybutynin (DITROPAN-XL) 5 MG 24 hr tablet Take 1 tablet (5 mg total) by mouth at bedtime.  30 tablet 2  . Oxycodone HCl 10 MG TABS Take 1 tablet (10 mg total) by mouth 3 (three) times daily. 90 tablet 0  . sildenafil (REVATIO) 20 MG tablet Take 2 to 5 tabs (40 MG to 100 MG) prior to activity as needed. 50 tablet 0  . DULoxetine (CYMBALTA) 60 MG capsule Take 1 capsule (60 mg total) by mouth daily. (Patient not taking: Reported on 08/12/2017) 30 capsule 2  . sildenafil (REVATIO) 20 MG tablet TAKE 2 TO 5 TABLETS BY MOUTH PRIOR TO ACTIVITY AS NEEDED 90 tablet 0   No facility-administered medications prior to visit.     ROS See HPI  Objective:  BP 122/82 (BP Location: Left Arm, Patient Position: Sitting, Cuff Size: Normal)   Pulse 89   Temp 97.9 F (36.6 C) (Oral)   Ht 5' 10"  (1.778 m)   Wt 163 lb (73.9 kg)   SpO2 100%   BMI 23.39 kg/m   BP Readings from Last 3 Encounters:  08/12/17 122/82  04/04/17 118/80  03/29/17 129/78    Wt Readings from Last 3 Encounters:  08/12/17 163 lb (73.9 kg)  04/04/17 171 lb 12.8 oz (77.9 kg)  03/29/17 170 lb (77.1 kg)    Physical Exam  Constitutional: He is oriented to person, place, and time.  Cardiovascular: Normal rate.   Pulmonary/Chest: Effort normal.  Musculoskeletal:       Lumbar back: He exhibits decreased range of motion, tenderness, swelling and pain. He exhibits no bony tenderness and  no edema.       Back:  Neurological: He is alert and oriented to person, place, and time.  Ataxic gait. Ambulates with cane  Skin: Skin is warm and dry.  Psychiatric: Thought content normal.  Vitals reviewed.   Lab Results  Component Value Date   WBC 7.7 08/12/2017   HGB 15.0 08/12/2017   HCT 44.3 08/12/2017   PLT 228.0 08/12/2017   GLUCOSE 101 (H) 02/04/2016   ALT 27 02/04/2016   AST 20 02/04/2016   NA 140 02/04/2016   K 4.2 02/04/2016   CL 103 02/04/2016   CREATININE 0.94 02/04/2016   BUN 12 02/04/2016   CO2 32 02/04/2016    Dg Thoracic Spine W/swimmers  Result Date: 04/04/2017 CLINICAL DATA:  Fall 2 weeks ago with  thoracic spine pain EXAM: THORACIC SPINE - 3 VIEWS COMPARISON:  02/16/2016 . FINDINGS: Vertebral body height is well maintained. No paraspinal mass lesion is seen. Visualized ribcage is within normal limits IMPRESSION: No acute abnormality noted. Electronically Signed   By: Inez Catalina M.D.   On: 04/04/2017 15:31    Assessment & Plan:   Gabriel Burns was seen today for follow-up.  Diagnoses and all orders for this visit:  Pain management -     Pain Mgmt Base w/Conf, Ur; Future -     Cancel: Ethanol; Future -     Ambulatory referral to Pain Clinic -     Cancel: Ethanol; Future -     Ethanol; Future  Chronic pain syndrome -     Pain Mgmt Base w/Conf, Ur; Future -     Oxycodone HCl 10 MG TABS; Take 1 tablet (10 mg total) by mouth 3 (three) times daily. -     Ambulatory referral to Pain Clinic  Recurrent major depressive disorder, in full remission ([place])  Pain at surgical incision -     CBC w/Diff; Future -     Sed Rate (ESR); Future  MS (multiple sclerosis) (HCC) -     amphetamine-dextroamphetamine (ADDERALL) 20 MG tablet; Take 1 tablet (20 mg total) by mouth daily. -     gabapentin (NEURONTIN) 300 MG capsule; Take 1 capsule (300 mg total) by mouth at bedtime.  Mixed stress and urge urinary incontinence -     oxybutynin (DITROPAN-XL) 5 MG 24 hr tablet; Take 1 tablet (5 mg total) by mouth at bedtime.  Erectile dysfunction, unspecified erectile dysfunction type -     sildenafil (REVATIO) 20 MG tablet; Take 1-2tabs prior to activity as needed.   I have discontinued Gabriel Whetsel. Burns's sildenafil. I have also changed his amphetamine-dextroamphetamine and sildenafil. Additionally, I am having him maintain his DULoxetine, Oxycodone HCl, oxybutynin, and gabapentin.  Meds ordered this encounter  Medications  . amphetamine-dextroamphetamine (ADDERALL) 20 MG tablet    Sig: Take 1 tablet (20 mg total) by mouth daily.    Dispense:  30 tablet    Refill:  0    Fill on or after 07/11/17     Order Specific Question:   Supervising Provider    Answer:   Cassandria Anger [[id number]]  . Oxycodone HCl 10 MG TABS    Sig: Take 1 tablet (10 mg total) by mouth 3 (three) times daily.    Dispense:  90 tablet    Refill:  0    Order Specific Question:   Supervising Provider    Answer:   Cassandria Anger [[id number]]  . oxybutynin (DITROPAN-XL) 5 MG 24 hr tablet    Sig: Take  1 tablet (5 mg total) by mouth at bedtime.    Dispense:  30 tablet    Refill:  0    Order Specific Question:   Supervising Provider    Answer:   Cassandria Anger [[id number]]  . gabapentin (NEURONTIN) 300 MG capsule    Sig: Take 1 capsule (300 mg total) by mouth at bedtime.    Dispense:  30 capsule    Refill:  0    Order Specific Question:   Supervising Provider    Answer:   Cassandria Anger [[id number]]  . sildenafil (REVATIO) 20 MG tablet    Sig: Take 1-2tabs prior to activity as needed.    Dispense:  15 tablet    Refill:  0    Order Specific Question:   Supervising Provider    Answer:   Cassandria Anger [[id number]]    Follow-up: Return in about 4 weeks (around 09/09/2017) for with new provider.  Wilfred Lacy, NP

## 2017-08-13 LAB — PAIN MGMT BASE W/CONF, UR
Benzodiazepines: NEGATIVE ng/mL (ref <100)
COCAINE METABOLITE: NEGATIVE ng/mL (ref <150)
CREATININE: 114.5 mg/dL
OPIATES: NEGATIVE ng/mL (ref <100)
OXYCODONE: NEGATIVE ng/mL (ref <100)
Oxidant: NEGATIVE ug/mL (ref <200)
pH: 7.31 (ref 4.5–9.0)

## 2017-08-24 ENCOUNTER — Telehealth: Payer: Self-pay | Admitting: Nurse Practitioner

## 2017-08-24 ENCOUNTER — Other Ambulatory Visit: Payer: Self-pay | Admitting: Neurological Surgery

## 2017-08-24 ENCOUNTER — Other Ambulatory Visit: Payer: Self-pay | Admitting: Nurse Practitioner

## 2017-08-24 DIAGNOSIS — N529 Male erectile dysfunction, unspecified: Secondary | ICD-10-CM

## 2017-08-24 NOTE — Telephone Encounter (Addendum)
sildenafil (REVATIO) 20 MG tablet   Patient is requesting a refill on this medication. He states he is out of it. He also states he usually gets 30 a month. Please advise.

## 2017-08-24 NOTE — Telephone Encounter (Signed)
Please advise, we gave him 12 pills on 08/12/2017, he has an appt with new PCP on 10/10/2017.

## 2017-08-24 NOTE — Telephone Encounter (Signed)
Called to inform patient. He asked what a PCP was. I informed. It is your primary care provider. He stated she is, I informed that NO, she saw him last time since [facility] is not long here. But Aura Camps will be his PCP. His girlfriend start yelling and using inappropriate language at me. I hung up the phone.

## 2017-08-24 NOTE — Telephone Encounter (Signed)
Spouse called back again stating that the medication he needs is to help his bowels.  Is wanting to ask Claris Gower again if she would refill med without seeing patient.

## 2017-08-24 NOTE — Telephone Encounter (Signed)
Patient called in wanting his medications refilled without an appointment.  I explained to him that he would have to have the appointment in order to get the refills.  Patient's spouse got on the phone and proceeded to use explicit language with me.

## 2017-08-24 NOTE — Telephone Encounter (Signed)
I will not be refilling this medication. He needs to get further refill from pcp

## 2017-08-29 ENCOUNTER — Other Ambulatory Visit: Payer: Self-pay

## 2017-08-29 ENCOUNTER — Encounter (HOSPITAL_COMMUNITY): Payer: Self-pay | Admitting: *Deleted

## 2017-08-29 NOTE — Progress Notes (Signed)
Pt denies SOB, chest pain, and being under the care of a cardiologist. Pt denies having a stress test, echo and cardiac cath. Pt denies having an EKG and chest x ray within the last year. Pt denies recent labs. Pt made aware to stop taking  Aspirin, vitamins, fish oil and herbal medications. Do not take any NSAIDs ie: Ibuprofen, Advil, Naproxen (Aleve), Motrin, BC and Goody Powder or any medication containing Aspirin. Pt verbalized understanding of all pre-op instructions.  

## 2017-08-30 ENCOUNTER — Inpatient Hospital Stay (HOSPITAL_COMMUNITY): Payer: Medicare HMO | Admitting: Anesthesiology

## 2017-08-30 ENCOUNTER — Encounter (HOSPITAL_COMMUNITY)
Admission: RE | Disposition: A | Discharge status: Home / Self Care | Payer: Self-pay | Source: Ambulatory Visit | Attending: Neurological Surgery

## 2017-08-30 ENCOUNTER — Inpatient Hospital Stay (HOSPITAL_COMMUNITY)
Admission: RE | Admit: 2017-08-30 | Discharge: 2017-09-01 | DRG: 029 | Disposition: A | Discharge status: Home / Self Care | Payer: Medicare HMO | Source: Ambulatory Visit | Attending: Neurological Surgery | Admitting: Neurological Surgery

## 2017-08-30 ENCOUNTER — Encounter (HOSPITAL_COMMUNITY): Payer: Self-pay | Admitting: *Deleted

## 2017-08-30 ENCOUNTER — Other Ambulatory Visit: Payer: Self-pay

## 2017-08-30 DIAGNOSIS — G9782 Other postprocedural complications and disorders of nervous system: Principal | ICD-10-CM | POA: Diagnosis present

## 2017-08-30 DIAGNOSIS — N529 Male erectile dysfunction, unspecified: Secondary | ICD-10-CM | POA: Diagnosis not present

## 2017-08-30 DIAGNOSIS — R69 Illness, unspecified: Secondary | ICD-10-CM | POA: Diagnosis not present

## 2017-08-30 DIAGNOSIS — G960 Cerebrospinal fluid leak: Secondary | ICD-10-CM | POA: Diagnosis present

## 2017-08-30 DIAGNOSIS — Z79899 Other long term (current) drug therapy: Secondary | ICD-10-CM | POA: Diagnosis not present

## 2017-08-30 DIAGNOSIS — G9619 Other disorders of meninges, not elsewhere classified: Secondary | ICD-10-CM | POA: Diagnosis not present

## 2017-08-30 DIAGNOSIS — J449 Chronic obstructive pulmonary disease, unspecified: Secondary | ICD-10-CM | POA: Diagnosis present

## 2017-08-30 DIAGNOSIS — Z79891 Long term (current) use of opiate analgesic: Secondary | ICD-10-CM

## 2017-08-30 DIAGNOSIS — F17210 Nicotine dependence, cigarettes, uncomplicated: Secondary | ICD-10-CM | POA: Diagnosis present

## 2017-08-30 DIAGNOSIS — G35 Multiple sclerosis: Secondary | ICD-10-CM | POA: Diagnosis not present

## 2017-08-30 DIAGNOSIS — G96198 Other disorders of meninges, not elsewhere classified: Secondary | ICD-10-CM | POA: Diagnosis present

## 2017-08-30 HISTORY — DX: Other chronic pain: G89.29

## 2017-08-30 HISTORY — DX: Low back pain, unspecified: M54.50

## 2017-08-30 HISTORY — DX: Personal history of urinary calculi: Z87.442

## 2017-08-30 HISTORY — DX: Low back pain: M54.5

## 2017-08-30 HISTORY — DX: Migraine, unspecified, not intractable, without status migrainosus: G43.909

## 2017-08-30 HISTORY — PX: WOUND EXPLORATION: SHX2669

## 2017-08-30 HISTORY — DX: Other disorders of meninges, not elsewhere classified: G96.19

## 2017-08-30 HISTORY — PX: REPAIR OF CEREBROSPINAL FLUID LEAK: SHX6322

## 2017-08-30 HISTORY — DX: Other reaction to spinal and lumbar puncture: G97.1

## 2017-08-30 LAB — BASIC METABOLIC PANEL
ANION GAP: 7 (ref 5–15)
BUN: 8 mg/dL (ref 6–20)
CALCIUM: 9.1 mg/dL (ref 8.9–10.3)
CO2: 23 mmol/L (ref 22–32)
CREATININE: 0.84 mg/dL (ref 0.61–1.24)
Chloride: 108 mmol/L (ref 101–111)
GFR calc Af Amer: >60 mL/min (ref >60)
GLUCOSE: 91 mg/dL (ref 65–99)
Potassium: 4.6 mmol/L (ref 3.5–5.1)
Sodium: 138 mmol/L (ref 135–145)

## 2017-08-30 LAB — CBC
HEMATOCRIT: 43.6 % (ref 39.0–52.0)
Hemoglobin: 15.1 g/dL (ref 13.0–17.0)
MCH: 33.8 pg (ref 26.0–34.0)
MCHC: 34.6 g/dL (ref 30.0–36.0)
MCV: 97.5 fL (ref 78.0–100.0)
PLATELETS: 225 10*3/uL (ref 150–400)
RBC: 4.47 MIL/uL (ref 4.22–5.81)
RDW: 13.6 % (ref 11.5–15.5)
WBC: 7 10*3/uL (ref 4.0–10.5)

## 2017-08-30 SURGERY — REPAIR OF CEREBROSPINAL FLUID LEAK
Anesthesia: General | Site: Spine Lumbar

## 2017-08-30 MED ORDER — THROMBIN (RECOMBINANT) 5000 UNITS EX SOLR
CUTANEOUS | Status: AC
  Filled 2017-08-30: qty 15000

## 2017-08-30 MED ORDER — SENNOSIDES-DOCUSATE SODIUM 8.6-50 MG PO TABS: 1.0000 | ORAL_TABLET | Freq: Every evening | ORAL | Status: DC | PRN

## 2017-08-30 MED ORDER — FENTANYL CITRATE (PF) 250 MCG/5ML IJ SOLN
INTRAMUSCULAR | Status: AC
  Filled 2017-08-30: qty 5

## 2017-08-30 MED ORDER — 0.9 % SODIUM CHLORIDE (POUR BTL) OPTIME
TOPICAL | Status: DC | PRN
  Administered 2017-08-30: 1000 mL

## 2017-08-30 MED ORDER — SODIUM CHLORIDE 0.9 % IR SOLN
Status: DC | PRN
  Administered 2017-08-30: 14:00:00

## 2017-08-30 MED ORDER — FLEET ENEMA 7-19 GM/118ML RE ENEM: 1.0000 | ENEMA | Freq: Once | RECTAL | Status: DC | PRN

## 2017-08-30 MED ORDER — KETAMINE HCL-SODIUM CHLORIDE 100-0.9 MG/10ML-% IV SOSY
PREFILLED_SYRINGE | INTRAVENOUS | Status: AC
  Filled 2017-08-30: qty 10

## 2017-08-30 MED ORDER — FENTANYL CITRATE (PF) 100 MCG/2ML IJ SOLN
INTRAMUSCULAR | Status: DC | PRN
Start: 2017-08-30 — End: 2017-08-30
  Administered 2017-08-30: 100 ug via INTRAVENOUS
  Administered 2017-08-30: 150 ug via INTRAVENOUS

## 2017-08-30 MED ORDER — HEMOSTATIC AGENTS (NO CHARGE) OPTIME
TOPICAL | Status: DC | PRN
  Administered 2017-08-30: 1 via TOPICAL

## 2017-08-30 MED ORDER — CEFAZOLIN SODIUM-DEXTROSE 2-4 GM/100ML-% IV SOLN
2.0000 g | INTRAVENOUS | Status: AC
  Administered 2017-08-30: 2 g via INTRAVENOUS
  Filled 2017-08-30: qty 100

## 2017-08-30 MED ORDER — GLYCOPYRROLATE 0.2 MG/ML IJ SOLN
INTRAMUSCULAR | Status: DC | PRN
  Administered 2017-08-30: 0.2 mg via INTRAVENOUS

## 2017-08-30 MED ORDER — ACETAMINOPHEN 325 MG PO TABS: 650.0000 mg | ORAL_TABLET | ORAL | Status: DC | PRN

## 2017-08-30 MED ORDER — HYDROCODONE-ACETAMINOPHEN 5-325 MG PO TABS
1.0000 | ORAL_TABLET | ORAL | Status: DC | PRN
  Administered 2017-08-30 – 2017-08-31 (×2): 1 via ORAL
  Filled 2017-08-30 (×2): qty 1

## 2017-08-30 MED ORDER — DOCUSATE SODIUM 100 MG PO CAPS
100.0000 mg | ORAL_CAPSULE | Freq: Two times a day (BID) | ORAL | Status: DC
  Administered 2017-08-30 – 2017-09-01 (×4): 100 mg via ORAL
  Filled 2017-08-30 (×4): qty 1

## 2017-08-30 MED ORDER — HYDROMORPHONE HCL 1 MG/ML IJ SOLN
0.2500 mg | INTRAMUSCULAR | Status: DC | PRN
  Administered 2017-08-30 (×4): 0.5 mg via INTRAVENOUS

## 2017-08-30 MED ORDER — BUPIVACAINE HCL (PF) 0.5 % IJ SOLN
INTRAMUSCULAR | Status: DC | PRN
  Administered 2017-08-30: 15:00:00

## 2017-08-30 MED ORDER — SUGAMMADEX SODIUM 500 MG/5ML IV SOLN
INTRAVENOUS | Status: DC | PRN
  Administered 2017-08-30: 300 mg via INTRAVENOUS

## 2017-08-30 MED ORDER — SENNA 8.6 MG PO TABS
1.0000 | ORAL_TABLET | Freq: Two times a day (BID) | ORAL | Status: DC
  Administered 2017-08-30 – 2017-09-01 (×4): 8.6 mg via ORAL
  Filled 2017-08-30 (×4): qty 1

## 2017-08-30 MED ORDER — OXYCODONE HCL 5 MG PO TABS
10.0000 mg | ORAL_TABLET | ORAL | Status: DC | PRN
  Administered 2017-08-31 – 2017-09-01 (×3): 10 mg via ORAL
  Filled 2017-08-30 (×3): qty 2

## 2017-08-30 MED ORDER — ONDANSETRON HCL 4 MG/2ML IJ SOLN
INTRAMUSCULAR | Status: DC | PRN
  Administered 2017-08-30: 4 mg via INTRAVENOUS

## 2017-08-30 MED ORDER — LIDOCAINE HCL (CARDIAC) 20 MG/ML IV SOLN
INTRAVENOUS | Status: DC | PRN
  Administered 2017-08-30: 100 mg via INTRAVENOUS

## 2017-08-30 MED ORDER — LIDOCAINE 2% (20 MG/ML) 5 ML SYRINGE
INTRAMUSCULAR | Status: AC
  Filled 2017-08-30: qty 10

## 2017-08-30 MED ORDER — KETAMINE HCL 10 MG/ML IJ SOLN
INTRAMUSCULAR | Status: DC | PRN
  Administered 2017-08-30: 40 mg via INTRAVENOUS

## 2017-08-30 MED ORDER — ACETAMINOPHEN 650 MG RE SUPP: 650.0000 mg | RECTAL | Status: DC | PRN

## 2017-08-30 MED ORDER — SODIUM CHLORIDE 0.9% FLUSH: 3.0000 mL | INTRAVENOUS | Status: DC | PRN

## 2017-08-30 MED ORDER — SODIUM CHLORIDE 0.9 % IV SOLN: INTRAVENOUS | Status: DC

## 2017-08-30 MED ORDER — MENTHOL 3 MG MT LOZG: 1.0000 | LOZENGE | OROMUCOSAL | Status: DC | PRN

## 2017-08-30 MED ORDER — ONDANSETRON HCL 4 MG/2ML IJ SOLN: 4.0000 mg | Freq: Four times a day (QID) | INTRAMUSCULAR | Status: DC | PRN

## 2017-08-30 MED ORDER — ONDANSETRON HCL 4 MG PO TABS: 4.0000 mg | ORAL_TABLET | Freq: Four times a day (QID) | ORAL | Status: DC | PRN

## 2017-08-30 MED ORDER — ACETAMINOPHEN 500 MG PO TABS
1000.0000 mg | ORAL_TABLET | Freq: Four times a day (QID) | ORAL | Status: AC
  Administered 2017-08-30 – 2017-08-31 (×3): 1000 mg via ORAL
  Filled 2017-08-30 (×4): qty 2

## 2017-08-30 MED ORDER — ROCURONIUM BROMIDE 10 MG/ML (PF) SYRINGE
PREFILLED_SYRINGE | INTRAVENOUS | Status: AC
  Filled 2017-08-30: qty 10

## 2017-08-30 MED ORDER — CEFAZOLIN SODIUM-DEXTROSE 2-4 GM/100ML-% IV SOLN
2.0000 g | Freq: Three times a day (TID) | INTRAVENOUS | Status: AC
  Administered 2017-08-30 – 2017-08-31 (×2): 2 g via INTRAVENOUS
  Filled 2017-08-30 (×2): qty 100

## 2017-08-30 MED ORDER — SODIUM CHLORIDE 0.9 % IJ SOLN
INTRAMUSCULAR | Status: AC
  Filled 2017-08-30: qty 10

## 2017-08-30 MED ORDER — DEXAMETHASONE SODIUM PHOSPHATE 10 MG/ML IJ SOLN
INTRAMUSCULAR | Status: DC | PRN
  Administered 2017-08-30: 10 mg via INTRAVENOUS

## 2017-08-30 MED ORDER — LACTATED RINGERS IV SOLN
INTRAVENOUS | Status: DC
  Administered 2017-08-30: 14:00:00 via INTRAVENOUS

## 2017-08-30 MED ORDER — BUPIVACAINE HCL (PF) 0.5 % IJ SOLN
INTRAMUSCULAR | Status: AC
  Filled 2017-08-30: qty 30

## 2017-08-30 MED ORDER — DEXTROSE 5 % IV SOLN
500.0000 mg | Freq: Four times a day (QID) | INTRAVENOUS | Status: DC | PRN
  Filled 2017-08-30: qty 5

## 2017-08-30 MED ORDER — BISACODYL 10 MG RE SUPP: 10.0000 mg | Freq: Every day | RECTAL | Status: DC | PRN

## 2017-08-30 MED ORDER — BUPIVACAINE-EPINEPHRINE (PF) 0.5% -1:200000 IJ SOLN
INTRAMUSCULAR | Status: AC
  Filled 2017-08-30: qty 30

## 2017-08-30 MED ORDER — HYDROMORPHONE HCL 1 MG/ML IJ SOLN
INTRAMUSCULAR | Status: AC
  Filled 2017-08-30: qty 1

## 2017-08-30 MED ORDER — SODIUM CHLORIDE 0.9% FLUSH
3.0000 mL | Freq: Two times a day (BID) | INTRAVENOUS | Status: DC
  Administered 2017-08-30 – 2017-08-31 (×3): 3 mL via INTRAVENOUS

## 2017-08-30 MED ORDER — CHLORHEXIDINE GLUCONATE CLOTH 2 % EX PADS: 6.0000 | MEDICATED_PAD | Freq: Once | CUTANEOUS | Status: DC

## 2017-08-30 MED ORDER — GABAPENTIN 300 MG PO CAPS
300.0000 mg | ORAL_CAPSULE | Freq: Three times a day (TID) | ORAL | Status: DC
  Administered 2017-08-30 – 2017-09-01 (×6): 300 mg via ORAL
  Filled 2017-08-30 (×6): qty 1

## 2017-08-30 MED ORDER — BUPIVACAINE LIPOSOME 1.3 % IJ SUSP
20.0000 mL | INTRAMUSCULAR | Status: DC
  Filled 2017-08-30: qty 20

## 2017-08-30 MED ORDER — THROMBIN (RECOMBINANT) 5000 UNITS EX SOLR
CUTANEOUS | Status: DC | PRN
  Administered 2017-08-30: 14:00:00 via TOPICAL

## 2017-08-30 MED ORDER — MIDAZOLAM HCL 2 MG/2ML IJ SOLN
INTRAMUSCULAR | Status: AC
  Filled 2017-08-30: qty 2

## 2017-08-30 MED ORDER — NICOTINE 21 MG/24HR TD PT24
21.0000 mg | MEDICATED_PATCH | Freq: Every day | TRANSDERMAL | Status: DC
  Administered 2017-08-30 – 2017-09-01 (×3): 21 mg via TRANSDERMAL
  Filled 2017-08-30 (×3): qty 1

## 2017-08-30 MED ORDER — THROMBIN (RECOMBINANT) 5000 UNITS EX SOLR
CUTANEOUS | Status: AC
Start: 2017-08-30 — End: ?
  Filled 2017-08-30: qty 5000

## 2017-08-30 MED ORDER — DEXAMETHASONE SODIUM PHOSPHATE 10 MG/ML IJ SOLN
INTRAMUSCULAR | Status: AC
  Filled 2017-08-30: qty 1

## 2017-08-30 MED ORDER — LIDOCAINE-EPINEPHRINE (PF) 2 %-1:200000 IJ SOLN
INTRAMUSCULAR | Status: AC
  Filled 2017-08-30: qty 20

## 2017-08-30 MED ORDER — ONDANSETRON HCL 4 MG/2ML IJ SOLN
INTRAMUSCULAR | Status: AC
  Filled 2017-08-30: qty 2

## 2017-08-30 MED ORDER — SODIUM CHLORIDE 0.9 % IV SOLN: 250.0000 mL | INTRAVENOUS | Status: DC

## 2017-08-30 MED ORDER — BUPIVACAINE HCL 0.5 % IJ SOLN: INTRAMUSCULAR | Status: DC | PRN

## 2017-08-30 MED ORDER — PROPOFOL 10 MG/ML IV BOLUS
INTRAVENOUS | Status: AC
  Filled 2017-08-30: qty 20

## 2017-08-30 MED ORDER — LIDOCAINE-EPINEPHRINE 2 %-1:100000 IJ SOLN
INTRAMUSCULAR | Status: DC | PRN
  Administered 2017-08-30: 10 mL

## 2017-08-30 MED ORDER — PROMETHAZINE HCL 25 MG/ML IJ SOLN: 6.2500 mg | INTRAMUSCULAR | Status: DC | PRN

## 2017-08-30 MED ORDER — OXYCODONE HCL ER 10 MG PO T12A
10.0000 mg | EXTENDED_RELEASE_TABLET | Freq: Two times a day (BID) | ORAL | Status: DC
Start: 2017-08-30 — End: 2017-09-01
  Administered 2017-08-30 – 2017-09-01 (×4): 10 mg via ORAL
  Filled 2017-08-30 (×4): qty 1

## 2017-08-30 MED ORDER — HYDROMORPHONE HCL 1 MG/ML IJ SOLN: 1.0000 mg | INTRAMUSCULAR | Status: DC | PRN

## 2017-08-30 MED ORDER — PROPOFOL 10 MG/ML IV BOLUS
INTRAVENOUS | Status: DC | PRN
  Administered 2017-08-30: 200 mg via INTRAVENOUS

## 2017-08-30 MED ORDER — PHENOL 1.4 % MT LIQD: 1.0000 | OROMUCOSAL | Status: DC | PRN

## 2017-08-30 MED ORDER — ROCURONIUM BROMIDE 100 MG/10ML IV SOLN
INTRAVENOUS | Status: DC | PRN
  Administered 2017-08-30: 70 mg via INTRAVENOUS

## 2017-08-30 MED ORDER — ENSURE ENLIVE PO LIQD
237.0000 mL | Freq: Two times a day (BID) | ORAL | Status: DC
  Administered 2017-08-31 (×2): 237 mL via ORAL

## 2017-08-30 MED ORDER — METHOCARBAMOL 500 MG PO TABS
500.0000 mg | ORAL_TABLET | Freq: Four times a day (QID) | ORAL | Status: DC | PRN
  Administered 2017-08-30 – 2017-09-01 (×3): 500 mg via ORAL
  Filled 2017-08-30 (×3): qty 1

## 2017-08-30 SURGICAL SUPPLY — 68 items
ADH SKN CLS APL DERMABOND .7 (GAUZE/BANDAGES/DRESSINGS) ×1
APL SKNCLS STERI-STRIP NONHPOA (GAUZE/BANDAGES/DRESSINGS)
BAG DECANTER FOR FLEXI CONT (MISCELLANEOUS) ×3 IMPLANT
BENZOIN TINCTURE PRP APPL 2/3 (GAUZE/BANDAGES/DRESSINGS) IMPLANT
BIT DRILL NEURO 2X3.1 SFT TUCH (MISCELLANEOUS) ×1 IMPLANT
BLADE CLIPPER SURG (BLADE) ×1 IMPLANT
BLADE SURG 11 STRL SS (BLADE) ×1 IMPLANT
BUR ROUND FLUTED 5 RND (BURR) ×1 IMPLANT
CANISTER SUCT 3000ML PPV (MISCELLANEOUS) ×3 IMPLANT
CARTRIDGE OIL MAESTRO DRILL (MISCELLANEOUS) ×1 IMPLANT
CHLORAPREP W/TINT 26ML (MISCELLANEOUS) ×2 IMPLANT
CONT SPEC 4OZ CLIKSEAL STRL BL (MISCELLANEOUS) ×2 IMPLANT
DECANTER SPIKE VIAL GLASS SM (MISCELLANEOUS) ×2 IMPLANT
DERMABOND ADVANCED (GAUZE/BANDAGES/DRESSINGS) ×1
DERMABOND ADVANCED .7 DNX12 (GAUZE/BANDAGES/DRESSINGS) ×1 IMPLANT
DIFFUSER DRILL AIR PNEUMATIC (MISCELLANEOUS) ×1 IMPLANT
DRAPE MICROSCOPE LEICA (MISCELLANEOUS) ×2 IMPLANT
DRAPE POUCH INSTRU U-SHP 10X18 (DRAPES) ×1 IMPLANT
DRAPE SURG 17X23 STRL (DRAPES) ×2 IMPLANT
DRILL NEURO 2X3.1 SOFT TOUCH (MISCELLANEOUS)
DRSG OPSITE POSTOP 4X6 (GAUZE/BANDAGES/DRESSINGS) ×1 IMPLANT
DURAMATRIX ONLAY 2X2 (Neuro Prosthesis/Implant) ×1 IMPLANT
ELECT COATED BLADE 2.86 ST (ELECTRODE) ×2 IMPLANT
ELECT REM PT RETURN 9FT ADLT (ELECTROSURGICAL) ×2
ELECTRODE REM PT RTRN 9FT ADLT (ELECTROSURGICAL) ×1 IMPLANT
GAUZE SPONGE 4X4 12PLY STRL (GAUZE/BANDAGES/DRESSINGS) IMPLANT
GAUZE SPONGE 4X4 16PLY XRAY LF (GAUZE/BANDAGES/DRESSINGS) IMPLANT
GLOVE BIO SURGEON STRL SZ7 (GLOVE) ×2 IMPLANT
GLOVE BIOGEL PI IND STRL 7.0 (GLOVE) IMPLANT
GLOVE BIOGEL PI IND STRL 7.5 (GLOVE) ×1 IMPLANT
GLOVE BIOGEL PI INDICATOR 7.0 (GLOVE) ×2
GLOVE BIOGEL PI INDICATOR 7.5 (GLOVE) ×3
GLOVE SS BIOGEL STRL SZ 7.5 (GLOVE) ×2 IMPLANT
GLOVE SUPERSENSE BIOGEL SZ 7.5 (GLOVE) ×2
GOWN STRL REUS W/ TWL LRG LVL3 (GOWN DISPOSABLE) ×1 IMPLANT
GOWN STRL REUS W/ TWL XL LVL3 (GOWN DISPOSABLE) IMPLANT
GOWN STRL REUS W/TWL LRG LVL3 (GOWN DISPOSABLE) ×2
GOWN STRL REUS W/TWL XL LVL3 (GOWN DISPOSABLE)
HEMOSTAT POWDER KIT SURGIFOAM (HEMOSTASIS) ×2 IMPLANT
KIT BASIN OR (CUSTOM PROCEDURE TRAY) ×2 IMPLANT
KIT ROOM TURNOVER OR (KITS) ×2 IMPLANT
NDL HYPO 21X1.5 SAFETY (NEEDLE) ×2 IMPLANT
NEEDLE HYPO 21X1.5 SAFETY (NEEDLE) ×4 IMPLANT
NS IRRIG 1000ML POUR BTL (IV SOLUTION) ×2 IMPLANT
OIL CARTRIDGE MAESTRO DRILL (MISCELLANEOUS)
PACK LAMINECTOMY NEURO (CUSTOM PROCEDURE TRAY) ×2 IMPLANT
PACK UNIVERSAL I (CUSTOM PROCEDURE TRAY) ×2 IMPLANT
PAD ARMBOARD 7.5X6 YLW CONV (MISCELLANEOUS) ×6 IMPLANT
PATTIES SURGICAL .5X1.5 (GAUZE/BANDAGES/DRESSINGS) ×1 IMPLANT
RUBBERBAND STERILE (MISCELLANEOUS) ×4 IMPLANT
SEALANT ADHERUS EXTEND TIP (MISCELLANEOUS) ×1 IMPLANT
SPONGE NEURO XRAY DETECT 1X3 (DISPOSABLE) ×1 IMPLANT
SPONGE SURGIFOAM ABS GEL SZ50 (HEMOSTASIS) ×1 IMPLANT
STRIP CLOSURE SKIN 1/2X4 (GAUZE/BANDAGES/DRESSINGS) IMPLANT
SUT NURALON 4 0 TR CR/8 (SUTURE) ×1 IMPLANT
SUT PROLENE 6 0 BV (SUTURE) ×2 IMPLANT
SUT STRATAFIX MNCRL+ 3-0 PS-2 (SUTURE)
SUT STRATAFIX MONOCRYL 3-0 (SUTURE)
SUT VIC AB 0 CT1 18XCR BRD8 (SUTURE) ×1 IMPLANT
SUT VIC AB 0 CT1 8-18 (SUTURE)
SUT VIC AB 2-0 CT1 18 (SUTURE) ×1 IMPLANT
SUT VIC AB 4-0 PS2 27 (SUTURE) IMPLANT
SUTURE STRATFX MNCRL+ 3-0 PS-2 (SUTURE) IMPLANT
SYR 30ML LL (SYRINGE) ×4 IMPLANT
SYR CONTROL 10ML LL (SYRINGE) ×2 IMPLANT
TOWEL GREEN STERILE (TOWEL DISPOSABLE) ×2 IMPLANT
TOWEL GREEN STERILE FF (TOWEL DISPOSABLE) ×2 IMPLANT
WATER STERILE IRR 1000ML POUR (IV SOLUTION) ×2 IMPLANT

## 2017-08-30 NOTE — H&P (Signed)
Chief Complaint   Low back pain, swelling at surgical site  HPI   HPI: Gabriel Burns is a 46 y.o. male with swelling at surgical site s/p lumbar surgery. He is here today for wound exploration and repair of CSF fistula. He feels well today. No concerns.   Patient Active Problem List   Diagnosis Date Noted  . Bulging lumbar disc 04/04/2017  . Acute right-sided thoracic back pain 04/04/2017  . Sciatica of right side 10/29/2016  . Alcohol abuse 10/08/2016  . Erectile dysfunction 02/09/2016  . Depression 01/08/2016  . MS (multiple sclerosis) (HCC) 05/22/2015  . COPD (chronic obstructive pulmonary disease) (HCC) 05/22/2015  . Tobacco use disorder 05/22/2015  . Chronic pain 05/22/2015    PMH: Past Medical History:  Diagnosis Date  . COPD (chronic obstructive pulmonary disease) (HCC)   . Depression   . History of kidney stones   . Multiple sclerosis (HCC)   . Pseudomeningocele   . Spinal headache    woke up during procedure    PSH: Past Surgical History:  Procedure Laterality Date  . BACK SURGERY  07/24/2017  . LITHOTRIPSY      Medications Prior to Admission  Medication Sig Dispense Refill Last Dose  . amphetamine-dextroamphetamine (ADDERALL) 20 MG tablet Take 1 tablet (20 mg total) by mouth daily. (Patient taking differently: Take 20 mg 2 (two) times daily by mouth. ) 30 tablet 0 08/29/2017 at Unknown time  . gabapentin (NEURONTIN) 300 MG capsule Take 1 capsule (300 mg total) by mouth at bedtime. (Patient taking differently: Take 300 mg 2 (two) times daily by mouth. ) 30 capsule 0 08/29/2017 at Unknown time  . oxybutynin (DITROPAN-XL) 5 MG 24 hr tablet Take 1 tablet (5 mg total) by mouth at bedtime. 30 tablet 0 08/29/2017 at Unknown time  . Oxycodone HCl 10 MG TABS Take 1 tablet (10 mg total) by mouth 3 (three) times daily. 90 tablet 0 Past Week at Unknown time  . sildenafil (REVATIO) 20 MG tablet Take 1-2tabs prior to activity as needed. 15 tablet 0     SH: Social  History   Tobacco Use  . Smoking status: Current Every Day Smoker    Packs/day: 1.00    Years: 29.00    Pack years: 29.00    Types: Cigars, Cigarettes  . Smokeless tobacco: Never Used  Substance Use Topics  . Alcohol use: No    Frequency: Never  . Drug use: No    MEDS: Prior to Admission medications   Medication Sig Start Date End Date Taking? Authorizing Provider  amphetamine-dextroamphetamine (ADDERALL) 20 MG tablet Take 1 tablet (20 mg total) by mouth daily. Patient taking differently: Take 20 mg 2 (two) times daily by mouth.  08/12/17  Yes Nche, Bonna Gainsharlotte Lum, NP  gabapentin (NEURONTIN) 300 MG capsule Take 1 capsule (300 mg total) by mouth at bedtime. Patient taking differently: Take 300 mg 2 (two) times daily by mouth.  08/12/17  Yes Nche, Bonna Gainsharlotte Lum, NP  oxybutynin (DITROPAN-XL) 5 MG 24 hr tablet Take 1 tablet (5 mg total) by mouth at bedtime. 08/12/17  Yes Nche, Bonna Gainsharlotte Lum, NP  Oxycodone HCl 10 MG TABS Take 1 tablet (10 mg total) by mouth 3 (three) times daily. 08/12/17  Yes Nche, Bonna Gainsharlotte Lum, NP  sildenafil (REVATIO) 20 MG tablet Take 1-2tabs prior to activity as needed. 08/12/17   Nche, Bonna Gainsharlotte Lum, NP    ALLERGY: No Known Allergies  Social History   Tobacco Use  . Smoking status: Current Every  Day Smoker    Packs/day: 1.00    Years: 29.00    Pack years: 29.00    Types: Cigars, Cigarettes  . Smokeless tobacco: Never Used  Substance Use Topics  . Alcohol use: No    Frequency: Never     Family History  Problem Relation Age of Onset  . Hypertension Father   . Alcohol abuse Paternal Grandfather      ROS   Review of Systems  Constitutional: Negative.   HENT: Negative.   Eyes: Negative.   Respiratory: Negative.   Cardiovascular: Negative.   Gastrointestinal: Negative.   Genitourinary: Negative.   Musculoskeletal: Positive for back pain and myalgias.  Skin: Negative.   Neurological: Negative.   Psychiatric/Behavioral: Negative.     Exam    Vitals:   08/30/17 1128  BP: 134/77  Pulse: 98  Resp: 19  Temp: 98.1 F (36.7 C)  SpO2: 98%   General appearance: WDWN, NAD Eyes: PERRL, Fundoscopic: normal Cardiovascular: Regular rate and rhythm without murmurs, rubs, gallops. No edema or variciosities. Distal pulses normal. Pulmonary: Clear to auscultation Musculoskeletal:     Muscle tone upper extremities: Normal    Muscle tone lower extremities: Normal    Motor exam: Upper Extremities Deltoid Bicep Tricep Grip  Right 5/5 5/5 5/5 5/5  Left 5/5 5/5 5/5 5/5   Lower Extremity IP Quad PF DF EHL  Right 5/5 5/5 5/5 5/5 5/5  Left 5/5 5/5 5/5 5/5 5/5   Neurological Awake, alert, oriented Memory and concentration grossly intact Speech fluent, appropriate CNII: Visual fields normal CNIII/IV/VI: EOMI CNV: Facial sensation normal CNVII: Symmetric, normal strength CNVIII: Grossly normal CNIX: Normal palate movement CNXI: Trap and SCM strength normal CN XII: Tongue protrusion normal Sensation grossly intact to LT DTR: Normal Coordination (finger/nose & heel/shin): Normal  Results - Imaging/Labs   No results found for this or any previous visit (from the past 48 hour(s)).  No results found.  Impression/Plan   46 y.o. male with pseudomeningocele s/p lumbar surgery. Here today for lumbar wound exploration with repair of CSF fistula. We have discussed risks, benefits and alternatives to surgery. He wishes to proceed. All questions answered.

## 2017-08-30 NOTE — Transfer of Care (Signed)
Immediate Anesthesia Transfer of Care Note  Patient: Gabriel Burns  Procedure(s) Performed: 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 (N/A Spine Lumbar)  Patient Location: PACU  Anesthesia Type:General  Level of Consciousness: awake  Airway & Oxygen Therapy: Patient Spontanous Breathing  Post-op Assessment: Report given to RN and Post -op Vital signs reviewed and stable  Post vital signs: Reviewed  Last Vitals:  Vitals:   08/30/17 1128  BP: 134/77  Pulse: 98  Resp: 19  Temp: 36.7 C  SpO2: 98%    Last Pain:  Vitals:   08/30/17 1153  TempSrc:   PainSc: 4       Patients Stated Pain Goal: 2 (08/30/17 1153)  Complications: No apparent anesthesia complications

## 2017-08-30 NOTE — Progress Notes (Signed)
Pt arrived to [id number] via stretcher.  Pt alert and oriented, c/o pain 6/10.  VSS.  Will continue to monitor.  Derenda MisMichiko M Worth Kober, RN

## 2017-08-30 NOTE — Anesthesia Procedure Notes (Signed)
Procedure Name: Intubation Date/Time: 08/30/2017 1:43 PM Performed by: Milford Cage, RN Pre-anesthesia Checklist: Patient identified, Emergency Drugs available, Suction available, Patient being monitored and Timeout performed Patient Re-evaluated:Patient Re-evaluated prior to induction Oxygen Delivery Method: Circle system utilized Preoxygenation: Pre-oxygenation with 100% oxygen Induction Type: IV induction Ventilation: Mask ventilation without difficulty Laryngoscope Size: Mac and 4 Grade View: Grade I Tube type: Oral Tube size: 7.5 mm Number of attempts: 1 Airway Equipment and Method: Stylet Placement Confirmation: ETT inserted through vocal cords under direct vision Secured at: 23 cm Tube secured with: Tape Dental Injury: Teeth and Oropharynx as per pre-operative assessment

## 2017-08-30 NOTE — Anesthesia Postprocedure Evaluation (Signed)
Anesthesia Post Note  Patient: Gabriel Burns  Procedure(s) Performed: 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 (N/A Spine Lumbar)     Patient location during evaluation: PACU Anesthesia Type: General Level of consciousness: sedated Pain management: pain level controlled Vital Signs Assessment: post-procedure vital signs reviewed and stable Respiratory status: spontaneous breathing and respiratory function stable Cardiovascular status: stable Postop Assessment: no apparent nausea or vomiting Anesthetic complications: no    Last Vitals:  Vitals:   08/30/17 1630 08/30/17 1634  BP:    Pulse: 88 87  Resp: 18 14  Temp: 36.5 C   SpO2: 99% 99%       LLE Sensation: Full sensation (08/30/17 1630)   RLE Sensation: Full sensation (08/30/17 1630)      Dwyane Dupree DANIEL

## 2017-08-30 NOTE — Brief Op Note (Signed)
08/30/2017  3:00 PM  PATIENT:  Gabriel Burns  46 y.o. male  PRE-OPERATIVE DIAGNOSIS:  Pseudomeningocele  POST-OPERATIVE DIAGNOSIS:  Pseudomeningocele  PROCEDURE:  Procedure(s) with comments: 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 (N/A) - 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  SURGEON:  Surgeon(s) and Role:    * Ditty, Loura Halt, MD - Primary  PHYSICIAN ASSISTANT: Cindra Presume, PA-C  ANESTHESIA:   general  EBL:  25 mL   BLOOD ADMINISTERED:none  DRAINS: none   LOCAL MEDICATIONS USED:  LIDOCAINE   SPECIMEN:  No Specimen  DISPOSITION OF SPECIMEN:  N/A  COUNTS:  YES  TOURNIQUET:  * No tourniquets in log *  DICTATION: .Note written in EPIC  PLAN OF CARE: Admit to inpatient   PATIENT DISPOSITION:  PACU - hemodynamically stable.   Delay start of Pharmacological VTE agent (>24hrs) due to surgical blood loss or risk of bleeding: yes

## 2017-08-30 NOTE — Progress Notes (Signed)
PT Cancellation Note  Patient Details Name: Gabriel Burns MRN: [id number] DOB: 08-07-1971   Cancelled Treatment:    Reason Eval/Treat Not Completed: Patient at procedure or test/unavailable.  Patient had surgery this pm:  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.  Will return tomorrow for PT evaluation.     Vena Austria 08/30/2017, 5:13 PM Durenda Hurt. Renaldo Fiddler, [facility] Acute Rehab Services Pager [phone]

## 2017-08-30 NOTE — Anesthesia Preprocedure Evaluation (Addendum)
Anesthesia Evaluation  Patient identified by MRN, date of birth, ID band Patient awake    Reviewed: Allergy & Precautions, NPO status , Patient's Chart, lab work & pertinent test results  History of Anesthesia Complications Negative for: history of anesthetic complications  Airway Mallampati: II  TM Distance: >3 FB Neck ROM: Full    Dental no notable dental hx. (+) Dental Advisory Given   Pulmonary COPD, Current Smoker,  Instructed not to smoke, but did smoke DOS   Pulmonary exam normal        Cardiovascular negative cardio ROS Normal cardiovascular exam     Neuro/Psych PSYCHIATRIC DISORDERS Depression negative psych ROS   GI/Hepatic negative GI ROS, Neg liver ROS,   Endo/Other  negative endocrine ROS  Renal/GU negative Renal ROS  negative genitourinary   Musculoskeletal negative musculoskeletal ROS (+)   Abdominal   Peds negative pediatric ROS (+)  Hematology negative hematology ROS (+)   Anesthesia Other Findings   Reproductive/Obstetrics negative OB ROS                             Anesthesia Physical Anesthesia Plan  ASA: III  Anesthesia Plan: General   Post-op Pain Management:    Induction: Intravenous  PONV Risk Score and Plan: 2 and Ondansetron and Dexamethasone  Airway Management Planned: Oral ETT  Additional Equipment:   Intra-op Plan:   Post-operative Plan: Extubation in OR  Informed Consent: I have reviewed the patients History and Physical, chart, labs and discussed the procedure including the risks, benefits and alternatives for the proposed anesthesia with the patient or authorized representative who has indicated his/her understanding and acceptance.   Dental advisory given  Plan Discussed with: CRNA and Anesthesiologist  Anesthesia Plan Comments:        Anesthesia Quick Evaluation

## 2017-08-31 ENCOUNTER — Encounter (HOSPITAL_COMMUNITY): Payer: Self-pay | Admitting: Neurological Surgery

## 2017-08-31 LAB — BASIC METABOLIC PANEL
Anion gap: 8 (ref 5–15)
BUN: 11 mg/dL (ref 6–20)
CO2: 24 mmol/L (ref 22–32)
Calcium: 9 mg/dL (ref 8.9–10.3)
Chloride: 103 mmol/L (ref 101–111)
Creatinine, Ser: 0.94 mg/dL (ref 0.61–1.24)
GFR calc Af Amer: >60 mL/min (ref >60)
GFR calc non Af Amer: >60 mL/min (ref >60)
Glucose, Bld: 133 mg/dL — ABNORMAL HIGH (ref 65–99)
Potassium: 4.1 mmol/L (ref 3.5–5.1)
Sodium: 135 mmol/L (ref 135–145)

## 2017-08-31 LAB — CBC
HCT: 40.6 % (ref 39.0–52.0)
Hemoglobin: 13.6 g/dL (ref 13.0–17.0)
MCH: 32.3 pg (ref 26.0–34.0)
MCHC: 33.5 g/dL (ref 30.0–36.0)
MCV: 96.4 fL (ref 78.0–100.0)
Platelets: 231 10*3/uL (ref 150–400)
RBC: 4.21 MIL/uL — ABNORMAL LOW (ref 4.22–5.81)
RDW: 13.1 % (ref 11.5–15.5)
WBC: 13.1 10*3/uL — ABNORMAL HIGH (ref 4.0–10.5)

## 2017-08-31 LAB — APTT: aPTT: 32 seconds (ref 24–36)

## 2017-08-31 LAB — PROTIME-INR
INR: 1.05
Prothrombin Time: 13.7 s (ref 11.4–15.2)

## 2017-08-31 MED ORDER — SIMETHICONE 80 MG PO CHEW
80.0000 mg | CHEWABLE_TABLET | Freq: Four times a day (QID) | ORAL | Status: DC | PRN
  Administered 2017-08-31: 80 mg via ORAL
  Filled 2017-08-31: qty 1

## 2017-08-31 NOTE — Progress Notes (Signed)
Patient not compliant with bed rest orders. RN encouraged use of urinal, patient using urinal but will get up for BM. Patient also complaining of gas. MD notified of non-compliance and of gas pains. MD gave verbal order for gas-x.

## 2017-08-31 NOTE — Progress Notes (Signed)
PT Cancellation Note  Patient Details Name: Gabriel Burns MRN: [id number] DOB: 1970/11/04   Cancelled Treatment:    Reason Eval/Treat Not Completed: Patient not medically ready  Pt on bedrest. Will follow.  Blake Divine A Dezire Turk 08/31/2017, 10:45 AM Mylo Red, PT, DPT [phone]

## 2017-08-31 NOTE — Progress Notes (Signed)
OT Cancellation Note  Patient Details Name: Gabriel Burns MRN: [id number] DOB: 10-05-1971   Cancelled Treatment:    Reason Eval/Treat Not Completed: Patient not medically ready(Pt on bedrest)  [facility] Katasha Riga, OT/L  [phone] 08/31/2017 08/31/2017, 7:40 AM

## 2017-08-31 NOTE — Progress Notes (Signed)
Initial Nutrition Assessment  DOCUMENTATION CODES:   Not applicable  INTERVENTION:    Continue Ensure Enlive po BID, each supplement provides 350 kcal and 20 grams of protein  Encouraged adequate calorie and protein intake.   NUTRITION DIAGNOSIS:   Increased nutrient needs related to chronic illness, wound healing as evidenced by estimated needs.  GOAL:   Patient will meet greater than or equal to 90% of their needs  MONITOR:   PO intake, Supplement acceptance, Labs, Weight trends, I & O's  REASON FOR ASSESSMENT:   Malnutrition Screening Tool    ASSESSMENT:   Pt with PMH of multiple sclerosis, COPD, and chronic lower back pain presents for wound exploration and repair of CSF fistula 08/30/17 for pseudomeningocele s/p lumbar surgery   Spoke with pt at bedside. Pt currently on bedrest.  Pt reports a good appetite PTA, consuming at least 1 large meal per day (dinner) with son and multiple snacks of "whatever I have around the house". No meal completion available per current admission. Pt reports fair activity level at baseline, placing flooring down in multiple rooms at his house. Pt hesitant to consume Ensures, RD educated they are for additional calorie and protein needs. Pt amenable to supplementation.  Pt reports fluctuation in weights and feels "comfortable" around 166 lbs. Pt reports prior to his MS diagnosis (2016) he weighed ~200 lbs and feels like he has lost some muscle mass since then.  Pt is at risk for malnutrition and RD encouraged continuous adequate caloric and protein intake.   Labs reviewed Medications reviewed; Colace, Senokot   NUTRITION - FOCUSED PHYSICAL EXAM:    Most Recent Value  Orbital Region  No depletion  Upper Arm Region  No depletion  Thoracic and Lumbar Region  No depletion  Buccal Region  No depletion  Temple Region  No depletion  Clavicle Bone Region  No depletion  Clavicle and Acromion Bone Region  No depletion  Scapular Bone Region   Unable to assess  Dorsal Hand  No depletion  Patellar Region  Mild depletion  Anterior Thigh Region  Mild depletion  Posterior Calf Region  Mild depletion  Edema (RD Assessment)  None      Diet Order:  Diet regular Room service appropriate? Yes; Fluid consistency: Thin  EDUCATION NEEDS:   No education needs have been identified at this time  Skin:  Skin Assessment: Skin Integrity Issues: Skin Integrity Issues:: Incisions Incisions: back  Last BM:  08/30/17  Height:   Ht Readings from Last 1 Encounters:  08/30/17 5\' 10"  (1.778 m)    Weight:   Wt Readings from Last 1 Encounters:  08/30/17 166 lb (75.3 kg)    Ideal Body Weight:  75.45 kg  BMI:  Body mass index is 23.82 kg/m.  Estimated Nutritional Needs:   Kcal:  1850-2050  Protein:  95-110 grams  Fluid:  >/= 1.8 L/d  Fransisca Kaufmann, MS, RDN, LDN 08/31/2017 3:56 PM

## 2017-08-31 NOTE — Progress Notes (Signed)
No acute events No headache Back flat Moving legs well Keep flat until tomorrow then can d/c

## 2017-09-01 MED ORDER — HYDROCODONE-ACETAMINOPHEN 5-325 MG PO TABS: 1.0000 | ORAL_TABLET | ORAL | 0 refills | Status: DC | PRN

## 2017-09-01 NOTE — Evaluation (Signed)
Occupational Therapy Evaluation and Discharge Patient Details Name: Gabriel Burns MRN: [id number] DOB: 02/20/1971 Today's Date: 09/01/2017    History of Present Illness Gabriel Mcbryar Faucetteis a 46 y.o.malewith swelling at surgical site s/p lumbar surgery.S/p wound exploration and repair of CSF fistula. Bed rest for 48 hours. H/O MS, depression, alcohol abuse, depression, COPD.   Clinical Impression   PTA Pt independent in ADL and mobility with SPC. Has a "way of doing things" at baseline because of his MS. Pt functioning at mod I. Pt educated on best practices, compensatory strategies and techniques for maintaining back precautions however pt states "I do what I need too." Pt educated on AE for LB bathing dressing (grabber/reacher, long handle sponge, sock aide, long handle shoe horn) Pt with no further acute OT needs. OT signing off. Please re-consult if needed in future. Thank you for the opportunity to serve this patient.    Follow Up Recommendations  No OT follow up;Supervision - Intermittent    Equipment Recommendations  None recommended by OT(AE kit provided for Pt)    Recommendations for Other Services       Precautions / Restrictions Precautions Precautions: Back Precaution Booklet Issued: No Precaution Comments: educated pt on back precautions, pt with verbal confirmation but reports "with my MS i have to do what i need to do" Restrictions Weight Bearing Restrictions: No      Mobility Bed Mobility Overal bed mobility: Modified Independent             General bed mobility comments: pt with own way of transfering in/out of bed but does adhere to back precautions  Transfers Overall transfer level: Modified independent Equipment used: (walkin gcane)             General transfer comment: increased time, v/c's to push up from surface he is sitting one, minimize trunk flexion    Balance Overall balance assessment: Modified Independent(uses cane to prevent  falling)                                         ADL either performed or assessed with clinical judgement   ADL Overall ADL's : Modified independent                                       General ADL Comments: Educated Pt on AE for LB bathing/dressing. Pt provided with kit and instructed in each tool. Pt verbalized understanding, and said "in the end I'll just do what I need to do" but I will use these tools - also talked specifically about avoiding the tub (as Pt only has a tub) and Pt plans to sponge bathe     Vision Patient Visual Report: No change from baseline       Perception     Praxis      Pertinent Vitals/Pain Pain Assessment: 0-10 Pain Score: 7  Pain Location: back Pain Descriptors / Indicators: Constant;Discomfort Pain Intervention(s): Monitored during session     Hand Dominance Right   Extremity/Trunk Assessment Upper Extremity Assessment Upper Extremity Assessment: (impaired from MS)   Lower Extremity Assessment Lower Extremity Assessment: (impaired from MS)   Cervical / Trunk Assessment Cervical / Trunk Assessment: Other exceptions(recent back surgery)   Communication Communication Communication: No difficulties   Cognition Arousal/Alertness: Awake/alert Behavior During Therapy: [facility]  for tasks assessed/performed Overall Cognitive Status: Within Functional Limits for tasks assessed                                     General Comments  Pt's friend in room, and got education as well    Exercises     Shoulder Instructions      Home Living Family/patient expects to be discharged to:: Private residence Living Arrangements: Children(8 yo) Available Help at Discharge: Family;Available PRN/intermittently(father and friends) Type of Home: House Home Access: Stairs to enter Technical brewer of Steps: 1 Entrance Stairs-Rails: None Home Layout: One level     Bathroom Shower/Tub: Tub only    Biochemist, clinical: Standard     Home Equipment: None          Prior Functioning/Environment Level of Independence: Independent with assistive device(s)        Comments: uses walking cane        OT Problem List: Decreased range of motion;Decreased safety awareness;Decreased knowledge of use of DME or AE;Decreased knowledge of precautions;Pain      OT Treatment/Interventions:      OT Goals(Current goals can be found in the care plan section) Acute Rehab OT Goals Patient Stated Goal: home asap OT Goal Formulation: With patient Time For Goal Achievement: 09/09/17 Potential to Achieve Goals: Good  OT Frequency:     Barriers to D/C:            Co-evaluation              AM-PAC PT "6 Clicks" Daily Activity     Outcome Measure Help from another person eating meals?: None Help from another person taking care of personal grooming?: None Help from another person toileting, which includes using toliet, bedpan, or urinal?: A Little Help from another person bathing (including washing, rinsing, drying)?: A Little Help from another person to put on and taking off regular upper body clothing?: None Help from another person to put on and taking off regular lower body clothing?: A Little 6 Click Score: 21   End of Session Nurse Communication: Mobility status  Activity Tolerance: Patient tolerated treatment well Patient left: Other (comment);with family/visitor present(walking around room with [facility])  OT Visit Diagnosis: Pain;Unsteadiness on feet (R26.81) Pain - part of body: (back)                Time: 2091-9802 OT Time Calculation (min): 16 min Charges:  OT General Charges $OT Visit: 1 Visit OT Evaluation $OT Eval Low Complexity: 1 Low G-Codes:     Hulda Humphrey OTR/L [place] 09/01/2017, 1:05 PM

## 2017-09-01 NOTE — Progress Notes (Signed)
Patient struggling to remain flat in bed will let day shift know that patient is off flat bed rest. He is moving legs well has no headache and has had no issues overnight.

## 2017-09-01 NOTE — Progress Notes (Signed)
Patient ready for discharge to home; discharge instructions given and reviewed; Rx given; patient dressed; after working with PT and OT; patient discharged out via wheelchair accompanied by his friend to home.

## 2017-09-01 NOTE — Discharge Summary (Signed)
  Physician Discharge Summary  Patient ID: Gabriel RailJoseph F Burns MRN: [id number] DOB/AGE: 45/10/1970 45 y.o.  Admit date: 08/30/2017 Discharge date: 09/01/2017  Admission Diagnoses:CSF leak  Discharge Diagnoses: same Active Problems:   Pseudomeningocele   Discharged Condition: good  Hospital Course: patient admitted to the hospital underwent primary repair of CSF leg. Postop patient was kept flat for 48 hours and then slowly progressively mobilized he's currently been mobilizing has no headache ambulating voiding tolerating regular diet stable on pills. Patient will be discharged her scheduled follow-up 12 weeks with Dr. Bevely Palmeritty. On discharge patient's neurologic exam is stable he does have a little bit of focal swelling around the incision but is not complaining of any headache settled patient is taking these U [place] lie around a lot keep his head down get up to the bathroom led us know he experiences any drainage or recurrence of his headache.  Consults: Significant Diagnostic Studies: Treatments:primary  Interoperative CSF leak Discharge Exam: Blood pressure 117/71, pulse 73, temperature 98.3 F (36.8 C), temperature source Oral, resp. rate 18, height 5\' 10"  (1.778 m), weight 75.3 kg (166 lb), SpO2 97 %. Strength 5 out of 5 wound clean dry and intact with a little bit of soft swelling but no drainage  Disposition: home   Allergies as of 09/01/2017   No Known Allergies     Medication List    TAKE these medications   amphetamine-dextroamphetamine 20 MG tablet Commonly known as:  ADDERALL Take 1 tablet (20 mg total) by mouth daily. What changed:  when to take this   gabapentin 300 MG capsule Commonly known as:  NEURONTIN Take 1 capsule (300 mg total) by mouth at bedtime. What changed:  when to take this   HYDROcodone-acetaminophen 5-325 MG tablet Commonly known as:  NORCO/VICODIN Take 1 tablet by mouth every 4 (four) hours as needed for moderate pain ((score 4 to 6)).    oxybutynin 5 MG 24 hr tablet Commonly known as:  DITROPAN-XL Take 1 tablet (5 mg total) by mouth at bedtime.   Oxycodone HCl 10 MG Tabs Take 1 tablet (10 mg total) by mouth 3 (three) times daily.   sildenafil 20 MG tablet Commonly known as:  REVATIO Take 1-2tabs prior to activity as needed.        Signed: Ayliana Casciano P 09/01/2017, 9:23 AM

## 2017-09-01 NOTE — Progress Notes (Signed)
Dr Ditty's last note indicated that patient could get up and ambulate and be DC'd today. Patient able to get out of bed and ambulate in room without assistance, he does have limitations due to his MS but seems very capable regarding use of his cain and balance.

## 2017-09-01 NOTE — Progress Notes (Signed)
Physical Therapy Evaluation and Discharge Patient Details Name: Gabriel Burns MRN: [id number] DOB: 02/27/1971 Today's Date: 09/01/2017   History of Present Illness  Gabriel RevelJoseph Burns Faucetteis a 46 y.o.malewith swelling at surgical site s/p lumbar surgery.S/p wound exploration and repair of CSF fistula. Bed rest for 48 hours. H/O MS, depression, alcohol abuse, depression, COPD.  Clinical Impression  Pt functioning at mod I. Pt educated on best practices however pt states "I do what I need too." Pt with no further acute PT needs. PT signing off. Please re-consult if needed in future.     Follow Up Recommendations No PT follow up;Supervision - Intermittent    Equipment Recommendations  None recommended by PT    Recommendations for Other Services       Precautions / Restrictions Precautions Precautions: Back Precaution Booklet Issued: No Precaution Comments: educated pt on back precautions, pt with verbal confirmation but reports "with my MS i have to do what i need to do" Restrictions Weight Bearing Restrictions: No      Mobility  Bed Mobility Overal bed mobility: Modified Independent             General bed mobility comments: pt with own way of transfering in/out of bed but does adhere to back precautions  Transfers Overall transfer level: Modified independent Equipment used: (walkin gcane)             General transfer comment: increased time, v/c's to push up from surface he is sitting one, minimize trunk flexion  Ambulation/Gait Ambulation/Gait assistance: Modified independent (Device/Increase time) Ambulation Distance (Feet): 120 Feet Assistive device: Straight cane Gait Pattern/deviations: Step-through pattern;Decreased stride length;Decreased dorsiflexion - right Gait velocity: slow Gait velocity interpretation: Below normal speed for age/gender General Gait Details: pt with altered gait due MS. pt compensates with hip hiking and circumduction of R LE to  clear R foot  Stairs Stairs: Yes Stairs assistance: Min guard Stair Management: One Burns Right;With cane Number of Stairs: 1 General stair comments: pt with good technique  Wheelchair Mobility    Modified Rankin (Stroke Patients Only)       Balance Overall balance assessment: Modified Independent(uses cane to prevent falling)                                           Pertinent Vitals/Pain Pain Assessment: 0-10 Pain Score: 9  Pain Location: back    Home Living Family/patient expects to be discharged to:: Private residence Living Arrangements: Children(8 yo) Available Help at Discharge: Family;Available PRN/intermittently(father and friends) Type of Home: House Home Access: Stairs to enter Entrance Stairs-Rails: None Entrance Stairs-Number of Steps: 1 Home Layout: One level Home Equipment: None      Prior Function Level of Independence: Independent with assistive device(s)         Comments: uses walking cane     Hand Dominance   Dominant Hand: Right    Extremity/Trunk Assessment   Upper Extremity Assessment Upper Extremity Assessment: (impaired from MS)    Lower Extremity Assessment Lower Extremity Assessment: (weak from MS, R drop foot)    Cervical / Trunk Assessment Cervical / Trunk Assessment: (recent back surgery)  Communication   Communication: No difficulties  Cognition Arousal/Alertness: Awake/alert Behavior During Therapy: WFL for tasks assessed/performed Overall Cognitive Status: Within Functional Limits for tasks assessed  General Comments      Exercises     Assessment/Plan    PT Assessment Patent does not need any further PT services  PT Problem List         PT Treatment Interventions      PT Goals (Current goals can be found in the Care Plan section)  Acute Rehab PT Goals Patient Stated Goal: home asap PT Goal Formulation: All assessment and  education complete, DC therapy    Frequency     Barriers to discharge        Co-evaluation               AM-PAC PT "6 Clicks" Daily Activity  Outcome Measure Difficulty turning over in bed (including adjusting bedclothes, sheets and blankets)?: None Difficulty moving from lying on back to sitting on the side of the bed? : None Difficulty sitting down on and standing up from a chair with arms (e.g., wheelchair, bedside commode, etc,.)?: None Help needed moving to and from a bed to chair (including a wheelchair)?: None Help needed walking in hospital room?: None Help needed climbing 3-5 steps with a railing? : A Little 6 Click Score: 23    End of Session   Activity Tolerance: Patient tolerated treatment well Patient left: in chair;with call bell/phone within reach Nurse Communication: Mobility status PT Visit Diagnosis: Unsteadiness on feet (R26.81)    Time: 1130-1146 PT Time Calculation (min) (ACUTE ONLY): 16 min   Charges:   PT Evaluation $PT Eval Low Complexity: 1 Low     PT G CodesLewis Shock, PT, DPT Pager #: [phone] Office #: [phone]   Diamon Reddinger M Hertha Gergen 09/01/2017, 12:02 PM

## 2017-09-06 ENCOUNTER — Inpatient Hospital Stay (HOSPITAL_COMMUNITY)
Admission: AD | Admit: 2017-09-06 | Discharge: 2017-09-11 | DRG: 092 | Disposition: A | Discharge status: Home / Self Care | Payer: Medicare HMO | Source: Ambulatory Visit | Attending: Neurological Surgery | Admitting: Neurological Surgery

## 2017-09-06 DIAGNOSIS — G9782 Other postprocedural complications and disorders of nervous system: Secondary | ICD-10-CM | POA: Diagnosis not present

## 2017-09-06 DIAGNOSIS — Y848 Other medical procedures as the cause of abnormal reaction of the patient, or of later complication, without mention of misadventure at the time of the procedure: Secondary | ICD-10-CM | POA: Diagnosis not present

## 2017-09-06 DIAGNOSIS — T148XXA Other injury of unspecified body region, initial encounter: Secondary | ICD-10-CM

## 2017-09-06 DIAGNOSIS — F17210 Nicotine dependence, cigarettes, uncomplicated: Secondary | ICD-10-CM | POA: Diagnosis present

## 2017-09-06 DIAGNOSIS — M5431 Sciatica, right side: Secondary | ICD-10-CM | POA: Diagnosis not present

## 2017-09-06 DIAGNOSIS — L24A9 Irritant contact dermatitis due friction or contact with other specified body fluids: Secondary | ICD-10-CM

## 2017-09-06 DIAGNOSIS — R69 Illness, unspecified: Secondary | ICD-10-CM | POA: Diagnosis not present

## 2017-09-06 DIAGNOSIS — G35 Multiple sclerosis: Secondary | ICD-10-CM | POA: Diagnosis not present

## 2017-09-06 DIAGNOSIS — G9619 Other disorders of meninges, not elsewhere classified: Secondary | ICD-10-CM | POA: Diagnosis present

## 2017-09-06 DIAGNOSIS — G960 Cerebrospinal fluid leak: Secondary | ICD-10-CM | POA: Diagnosis present

## 2017-09-06 DIAGNOSIS — T8131XA Disruption of external operation (surgical) wound, not elsewhere classified, initial encounter: Secondary | ICD-10-CM | POA: Diagnosis not present

## 2017-09-06 DIAGNOSIS — M5126 Other intervertebral disc displacement, lumbar region: Secondary | ICD-10-CM | POA: Diagnosis not present

## 2017-09-06 DIAGNOSIS — M545 Low back pain: Secondary | ICD-10-CM | POA: Diagnosis present

## 2017-09-06 DIAGNOSIS — J449 Chronic obstructive pulmonary disease, unspecified: Secondary | ICD-10-CM | POA: Diagnosis present

## 2017-09-06 MED ORDER — OXYBUTYNIN CHLORIDE ER 5 MG PO TB24
5.0000 mg | ORAL_TABLET | Freq: Every day | ORAL | Status: DC
  Administered 2017-09-06 – 2017-09-10 (×5): 5 mg via ORAL
  Filled 2017-09-06 (×6): qty 1

## 2017-09-06 MED ORDER — ACETAMINOPHEN 325 MG PO TABS
650.0000 mg | ORAL_TABLET | Freq: Four times a day (QID) | ORAL | Status: DC | PRN
  Administered 2017-09-09: 650 mg via ORAL
  Filled 2017-09-06: qty 2

## 2017-09-06 MED ORDER — SODIUM CHLORIDE 0.9 % IV SOLN
INTRAVENOUS | Status: DC
  Administered 2017-09-07: 19:00:00 via INTRAVENOUS

## 2017-09-06 MED ORDER — NICOTINE 21 MG/24HR TD PT24
21.0000 mg | MEDICATED_PATCH | Freq: Every day | TRANSDERMAL | Status: DC
  Administered 2017-09-06 – 2017-09-10 (×5): 21 mg via TRANSDERMAL
  Filled 2017-09-06 (×5): qty 1

## 2017-09-06 MED ORDER — AMPHETAMINE-DEXTROAMPHETAMINE 10 MG PO TABS
20.0000 mg | ORAL_TABLET | Freq: Two times a day (BID) | ORAL | Status: DC
  Administered 2017-09-07 – 2017-09-11 (×5): 20 mg via ORAL
  Filled 2017-09-06 (×5): qty 2

## 2017-09-06 MED ORDER — TRAZODONE HCL 50 MG PO TABS
50.0000 mg | ORAL_TABLET | Freq: Every evening | ORAL | Status: DC | PRN
  Administered 2017-09-08: 50 mg via ORAL
  Filled 2017-09-06 (×2): qty 1

## 2017-09-06 MED ORDER — OXYCODONE HCL 5 MG PO TABS
5.0000 mg | ORAL_TABLET | ORAL | Status: DC | PRN
  Administered 2017-09-06 – 2017-09-08 (×4): 5 mg via ORAL
  Filled 2017-09-06 (×4): qty 1

## 2017-09-06 MED ORDER — DOCUSATE SODIUM 100 MG PO CAPS
100.0000 mg | ORAL_CAPSULE | Freq: Two times a day (BID) | ORAL | Status: DC
  Administered 2017-09-06 – 2017-09-10 (×9): 100 mg via ORAL
  Filled 2017-09-06 (×9): qty 1

## 2017-09-06 MED ORDER — BISACODYL 5 MG PO TBEC
5.0000 mg | DELAYED_RELEASE_TABLET | Freq: Every day | ORAL | Status: DC | PRN
  Administered 2017-09-10: 5 mg via ORAL
  Filled 2017-09-06: qty 1

## 2017-09-06 MED ORDER — SENNOSIDES-DOCUSATE SODIUM 8.6-50 MG PO TABS: 1.0000 | ORAL_TABLET | Freq: Every evening | ORAL | Status: DC | PRN

## 2017-09-06 MED ORDER — MAGNESIUM CITRATE PO SOLN
1.0000 | Freq: Once | ORAL | Status: AC | PRN
  Administered 2017-09-09: 1 via ORAL
  Filled 2017-09-06: qty 296

## 2017-09-06 MED ORDER — SODIUM CHLORIDE 0.9% FLUSH: 3.0000 mL | INTRAVENOUS | Status: DC | PRN

## 2017-09-06 MED ORDER — SODIUM CHLORIDE 0.9% FLUSH
3.0000 mL | Freq: Two times a day (BID) | INTRAVENOUS | Status: DC
  Administered 2017-09-06 – 2017-09-10 (×2): 3 mL via INTRAVENOUS

## 2017-09-06 MED ORDER — GABAPENTIN 300 MG PO CAPS
300.0000 mg | ORAL_CAPSULE | Freq: Two times a day (BID) | ORAL | Status: DC
  Administered 2017-09-06 – 2017-09-10 (×9): 300 mg via ORAL
  Filled 2017-09-06 (×9): qty 1

## 2017-09-06 MED ORDER — SODIUM CHLORIDE 0.9 % IV SOLN: 250.0000 mL | INTRAVENOUS | Status: DC | PRN

## 2017-09-06 MED ORDER — HYDROMORPHONE HCL 1 MG/ML IJ SOLN
1.0000 mg | INTRAMUSCULAR | Status: AC | PRN
  Administered 2017-09-08 (×3): 1 mg via INTRAVENOUS
  Filled 2017-09-06 (×3): qty 1

## 2017-09-06 MED ORDER — ONDANSETRON HCL 4 MG/2ML IJ SOLN: 4.0000 mg | Freq: Four times a day (QID) | INTRAMUSCULAR | Status: DC | PRN

## 2017-09-06 MED ORDER — ACETAMINOPHEN 650 MG RE SUPP: 650.0000 mg | Freq: Four times a day (QID) | RECTAL | Status: DC | PRN

## 2017-09-06 MED ORDER — METHOCARBAMOL 500 MG PO TABS
750.0000 mg | ORAL_TABLET | Freq: Four times a day (QID) | ORAL | Status: DC
  Administered 2017-09-06 – 2017-09-10 (×14): 750 mg via ORAL
  Filled 2017-09-06: qty 1
  Filled 2017-09-06 (×10): qty 2
  Filled 2017-09-06: qty 1
  Filled 2017-09-06 (×2): qty 2

## 2017-09-06 MED ORDER — ONDANSETRON HCL 4 MG PO TABS: 4.0000 mg | ORAL_TABLET | Freq: Four times a day (QID) | ORAL | Status: DC | PRN

## 2017-09-07 ENCOUNTER — Inpatient Hospital Stay (HOSPITAL_COMMUNITY): Payer: Medicare HMO | Admitting: Anesthesiology

## 2017-09-07 ENCOUNTER — Other Ambulatory Visit: Payer: Self-pay

## 2017-09-07 ENCOUNTER — Encounter (HOSPITAL_COMMUNITY)
Admission: AD | Disposition: A | Discharge status: Home / Self Care | Payer: Self-pay | Source: Ambulatory Visit | Attending: Neurological Surgery

## 2017-09-07 ENCOUNTER — Encounter (HOSPITAL_COMMUNITY): Payer: Self-pay | Admitting: *Deleted

## 2017-09-07 HISTORY — PX: PLACEMENT OF LUMBAR DRAIN: SHX6028

## 2017-09-07 HISTORY — PX: LUMBAR WOUND DEBRIDEMENT: SHX1988

## 2017-09-07 LAB — ABO/RH: ABO/RH(D): O POS

## 2017-09-07 LAB — GLUCOSE, CAPILLARY: Glucose-Capillary: 92 mg/dL (ref 65–99)

## 2017-09-07 LAB — TYPE AND SCREEN
ABO/RH(D): O POS
Antibody Screen: NEGATIVE

## 2017-09-07 LAB — HIV ANTIBODY (ROUTINE TESTING W REFLEX): HIV Screen 4th Generation wRfx: NONREACTIVE

## 2017-09-07 SURGERY — LUMBAR WOUND DEBRIDEMENT
Anesthesia: General | Site: Back

## 2017-09-07 MED ORDER — BUPIVACAINE HCL (PF) 0.25 % IJ SOLN
INTRAMUSCULAR | Status: AC
  Filled 2017-09-07: qty 30

## 2017-09-07 MED ORDER — LACTATED RINGERS IV SOLN
INTRAVENOUS | Status: DC
  Administered 2017-09-07: 13:00:00 via INTRAVENOUS

## 2017-09-07 MED ORDER — FENTANYL CITRATE (PF) 100 MCG/2ML IJ SOLN
INTRAMUSCULAR | Status: DC | PRN
  Administered 2017-09-07: 150 ug via INTRAVENOUS
  Administered 2017-09-07: 50 ug via INTRAVENOUS

## 2017-09-07 MED ORDER — THROMBIN (RECOMBINANT) 5000 UNITS EX SOLR
CUTANEOUS | Status: AC
  Filled 2017-09-07: qty 5000

## 2017-09-07 MED ORDER — 0.9 % SODIUM CHLORIDE (POUR BTL) OPTIME
TOPICAL | Status: DC | PRN
Start: 2017-09-07 — End: 2017-09-07
  Administered 2017-09-07: 1000 mL

## 2017-09-07 MED ORDER — CEFAZOLIN SODIUM-DEXTROSE 2-4 GM/100ML-% IV SOLN
2.0000 g | INTRAVENOUS | Status: AC
  Administered 2017-09-07: 2 g via INTRAVENOUS
  Filled 2017-09-07: qty 100

## 2017-09-07 MED ORDER — HYDROMORPHONE HCL 1 MG/ML IJ SOLN
0.2500 mg | INTRAMUSCULAR | Status: DC | PRN
  Administered 2017-09-07: 0.5 mg via INTRAVENOUS

## 2017-09-07 MED ORDER — HYDROMORPHONE HCL 1 MG/ML IJ SOLN
INTRAMUSCULAR | Status: AC
  Filled 2017-09-07: qty 1

## 2017-09-07 MED ORDER — PROMETHAZINE HCL 25 MG/ML IJ SOLN: 6.2500 mg | INTRAMUSCULAR | Status: DC | PRN

## 2017-09-07 MED ORDER — PROPOFOL 10 MG/ML IV BOLUS
INTRAVENOUS | Status: AC
  Filled 2017-09-07: qty 40

## 2017-09-07 MED ORDER — KETOROLAC TROMETHAMINE 30 MG/ML IJ SOLN
INTRAMUSCULAR | Status: AC
  Filled 2017-09-07: qty 1

## 2017-09-07 MED ORDER — HEMOSTATIC AGENTS (NO CHARGE) OPTIME
TOPICAL | Status: DC | PRN
  Administered 2017-09-07 (×3): 1 via TOPICAL

## 2017-09-07 MED ORDER — MIDAZOLAM HCL 2 MG/2ML IJ SOLN
INTRAMUSCULAR | Status: AC
  Filled 2017-09-07: qty 2

## 2017-09-07 MED ORDER — ONDANSETRON HCL 4 MG/2ML IJ SOLN
INTRAMUSCULAR | Status: DC | PRN
  Administered 2017-09-07: 4 mg via INTRAVENOUS

## 2017-09-07 MED ORDER — PROPOFOL 10 MG/ML IV BOLUS
INTRAVENOUS | Status: DC | PRN
  Administered 2017-09-07: 200 mg via INTRAVENOUS

## 2017-09-07 MED ORDER — MIDAZOLAM HCL 5 MG/5ML IJ SOLN
INTRAMUSCULAR | Status: DC | PRN
  Administered 2017-09-07: 2 mg via INTRAVENOUS

## 2017-09-07 MED ORDER — PROPOFOL 10 MG/ML IV BOLUS
INTRAVENOUS | Status: AC
  Filled 2017-09-07: qty 20

## 2017-09-07 MED ORDER — DEXAMETHASONE SODIUM PHOSPHATE 10 MG/ML IJ SOLN
INTRAMUSCULAR | Status: AC
  Filled 2017-09-07: qty 1

## 2017-09-07 MED ORDER — FENTANYL CITRATE (PF) 250 MCG/5ML IJ SOLN
INTRAMUSCULAR | Status: AC
  Filled 2017-09-07: qty 5

## 2017-09-07 MED ORDER — THROMBIN (RECOMBINANT) 5000 UNITS EX SOLR
CUTANEOUS | Status: DC | PRN
  Administered 2017-09-07: 5000 [IU] via TOPICAL

## 2017-09-07 MED ORDER — METHYLPREDNISOLONE ACETATE 80 MG/ML IJ SUSP
INTRAMUSCULAR | Status: AC
  Filled 2017-09-07: qty 1

## 2017-09-07 MED ORDER — LIDOCAINE 2% (20 MG/ML) 5 ML SYRINGE
INTRAMUSCULAR | Status: AC
  Filled 2017-09-07: qty 5

## 2017-09-07 MED ORDER — ROCURONIUM BROMIDE 10 MG/ML (PF) SYRINGE
PREFILLED_SYRINGE | INTRAVENOUS | Status: AC
  Filled 2017-09-07: qty 5

## 2017-09-07 MED ORDER — SODIUM CHLORIDE 0.9 % IR SOLN
Status: DC | PRN
  Administered 2017-09-07: 500 mL

## 2017-09-07 MED ORDER — DEXAMETHASONE SODIUM PHOSPHATE 10 MG/ML IJ SOLN
INTRAMUSCULAR | Status: DC | PRN
  Administered 2017-09-07: 10 mg via INTRAVENOUS

## 2017-09-07 MED ORDER — ROCURONIUM BROMIDE 100 MG/10ML IV SOLN
INTRAVENOUS | Status: DC | PRN
  Administered 2017-09-07: 40 mg via INTRAVENOUS

## 2017-09-07 MED ORDER — LIDOCAINE HCL (CARDIAC) 20 MG/ML IV SOLN
INTRAVENOUS | Status: DC | PRN
  Administered 2017-09-07: 60 mg via INTRAVENOUS

## 2017-09-07 MED ORDER — BUPIVACAINE-EPINEPHRINE (PF) 0.5% -1:200000 IJ SOLN
INTRAMUSCULAR | Status: AC
  Filled 2017-09-07: qty 30

## 2017-09-07 MED ORDER — LIDOCAINE-EPINEPHRINE 2 %-1:100000 IJ SOLN
INTRAMUSCULAR | Status: DC | PRN
  Administered 2017-09-07: 15 mL via INTRADERMAL

## 2017-09-07 MED ORDER — LIDOCAINE-EPINEPHRINE 2 %-1:100000 IJ SOLN
INTRAMUSCULAR | Status: AC
  Filled 2017-09-07: qty 1

## 2017-09-07 MED ORDER — SUGAMMADEX SODIUM 200 MG/2ML IV SOLN
INTRAVENOUS | Status: DC | PRN
  Administered 2017-09-07: 200 mg via INTRAVENOUS

## 2017-09-07 MED ORDER — BUPIVACAINE HCL (PF) 0.25 % IJ SOLN
INTRAMUSCULAR | Status: DC | PRN
  Administered 2017-09-07: 20 mL

## 2017-09-07 SURGICAL SUPPLY — 96 items
ADH SKN CLS APL DERMABOND .7 (GAUZE/BANDAGES/DRESSINGS) ×1
APL SKNCLS STERI-STRIP NONHPOA (GAUZE/BANDAGES/DRESSINGS)
APL SRG 60D 8 XTD TIP BNDBL (TIP) ×1
BAG DECANTER FOR FLEXI CONT (MISCELLANEOUS) ×2 IMPLANT
BENZOIN TINCTURE PRP APPL 2/3 (GAUZE/BANDAGES/DRESSINGS) IMPLANT
BIT DRILL NEURO 2X3.1 SFT TUCH (MISCELLANEOUS) ×1 IMPLANT
BLADE CLIPPER SURG (BLADE) IMPLANT
BLADE SURG 11 STRL SS (BLADE) ×2 IMPLANT
BLADE SURG 15 STRL LF DISP TIS (BLADE) ×1 IMPLANT
BLADE SURG 15 STRL SS (BLADE) ×2
BUR ROUND FLUTED 5 RND (BURR) ×2 IMPLANT
CANISTER SUCT 3000ML PPV (MISCELLANEOUS) ×3 IMPLANT
CARTRIDGE OIL MAESTRO DRILL (MISCELLANEOUS) ×1 IMPLANT
CHLORAPREP W/TINT 26ML (MISCELLANEOUS) ×1 IMPLANT
CONT SPEC 4OZ CLIKSEAL STRL BL (MISCELLANEOUS) ×2 IMPLANT
DECANTER SPIKE VIAL GLASS SM (MISCELLANEOUS) ×2 IMPLANT
DERMABOND ADVANCED (GAUZE/BANDAGES/DRESSINGS) ×1
DERMABOND ADVANCED .7 DNX12 (GAUZE/BANDAGES/DRESSINGS) ×2 IMPLANT
DIFFUSER DRILL AIR PNEUMATIC (MISCELLANEOUS) ×2 IMPLANT
DRAIN SUBARACHNOID (WOUND CARE) ×1 IMPLANT
DRAPE C-ARM 42X72 X-RAY (DRAPES) ×1 IMPLANT
DRAPE HALF SHEET 40X57 (DRAPES) ×2 IMPLANT
DRAPE LAPAROTOMY 100X72X124 (DRAPES) ×2 IMPLANT
DRAPE MICROSCOPE LEICA (MISCELLANEOUS) ×2 IMPLANT
DRAPE POUCH INSTRU U-SHP 10X18 (DRAPES) ×2 IMPLANT
DRAPE SURG 17X23 STRL (DRAPES) ×4 IMPLANT
DRILL NEURO 2X3.1 SOFT TOUCH (MISCELLANEOUS) ×2
DRSG OPSITE POSTOP 4X8 (GAUZE/BANDAGES/DRESSINGS) ×1 IMPLANT
DURAMATRIX ONLAY 2X2 (Neuro Prosthesis/Implant) ×1 IMPLANT
DURAPREP 26ML APPLICATOR (WOUND CARE) ×2 IMPLANT
DURASEAL APPLICATOR TIP (TIP) ×1 IMPLANT
DURASEAL SPINE SEALANT 3ML (MISCELLANEOUS) ×1 IMPLANT
ELECT COATED BLADE 2.86 ST (ELECTRODE) ×2 IMPLANT
ELECT REM PT RETURN 9FT ADLT (ELECTROSURGICAL) ×2
ELECTRODE REM PT RTRN 9FT ADLT (ELECTROSURGICAL) ×1 IMPLANT
GAUZE SPONGE 4X4 12PLY STRL (GAUZE/BANDAGES/DRESSINGS) IMPLANT
GAUZE SPONGE 4X4 16PLY XRAY LF (GAUZE/BANDAGES/DRESSINGS) ×1 IMPLANT
GLOVE BIO SURGEON STRL SZ7 (GLOVE) IMPLANT
GLOVE BIO SURGEON STRL SZ7.5 (GLOVE) IMPLANT
GLOVE BIOGEL PI IND STRL 6.5 (GLOVE) IMPLANT
GLOVE BIOGEL PI IND STRL 7.0 (GLOVE) IMPLANT
GLOVE BIOGEL PI IND STRL 7.5 (GLOVE) ×2 IMPLANT
GLOVE BIOGEL PI INDICATOR 6.5 (GLOVE) ×1
GLOVE BIOGEL PI INDICATOR 7.0 (GLOVE)
GLOVE BIOGEL PI INDICATOR 7.5 (GLOVE) ×2
GLOVE EXAM NITRILE LRG STRL (GLOVE) IMPLANT
GLOVE EXAM NITRILE XL STR (GLOVE) IMPLANT
GLOVE EXAM NITRILE XS STR PU (GLOVE) IMPLANT
GLOVE SS BIOGEL STRL SZ 7.5 (GLOVE) ×4 IMPLANT
GLOVE SUPERSENSE BIOGEL SZ 7.5 (GLOVE) ×4
GOWN STRL REUS W/ TWL LRG LVL3 (GOWN DISPOSABLE) ×3 IMPLANT
GOWN STRL REUS W/ TWL XL LVL3 (GOWN DISPOSABLE) IMPLANT
GOWN STRL REUS W/TWL 2XL LVL3 (GOWN DISPOSABLE) IMPLANT
GOWN STRL REUS W/TWL LRG LVL3 (GOWN DISPOSABLE) ×10
GOWN STRL REUS W/TWL XL LVL3 (GOWN DISPOSABLE)
HEMOSTAT POWDER KIT SURGIFOAM (HEMOSTASIS) ×2 IMPLANT
KIT BASIN OR (CUSTOM PROCEDURE TRAY) ×4 IMPLANT
KIT ROOM TURNOVER OR (KITS) ×4 IMPLANT
NDL HYPO 18GX1.5 BLUNT FILL (NEEDLE) ×1 IMPLANT
NDL HYPO 21X1.5 SAFETY (NEEDLE) ×2 IMPLANT
NDL HYPO 25X1 1.5 SAFETY (NEEDLE) ×2 IMPLANT
NEEDLE HYPO 18GX1.5 BLUNT FILL (NEEDLE) ×2 IMPLANT
NEEDLE HYPO 21X1.5 SAFETY (NEEDLE) ×6 IMPLANT
NEEDLE HYPO 25X1 1.5 SAFETY (NEEDLE) ×4 IMPLANT
NS IRRIG 1000ML POUR BTL (IV SOLUTION) ×4 IMPLANT
OIL CARTRIDGE MAESTRO DRILL (MISCELLANEOUS) ×2
PACK LAMINECTOMY NEURO (CUSTOM PROCEDURE TRAY) ×2 IMPLANT
PACK SURGICAL SETUP 50X90 (CUSTOM PROCEDURE TRAY) ×2 IMPLANT
PACK UNIVERSAL I (CUSTOM PROCEDURE TRAY) ×2 IMPLANT
PAD ARMBOARD 7.5X6 YLW CONV (MISCELLANEOUS) ×12 IMPLANT
PATTIES SURGICAL .5X1.5 (GAUZE/BANDAGES/DRESSINGS) ×2 IMPLANT
RUBBERBAND STERILE (MISCELLANEOUS) ×4 IMPLANT
SEALANT ADHERUS EXTEND TIP (MISCELLANEOUS) ×1 IMPLANT
SPECIMEN JAR SMALL (MISCELLANEOUS) IMPLANT
SPONGE NEURO XRAY DETECT 1X3 (DISPOSABLE) ×1 IMPLANT
SPONGE SURGIFOAM ABS GEL SZ50 (HEMOSTASIS) ×2 IMPLANT
STRIP CLOSURE SKIN 1/2X4 (GAUZE/BANDAGES/DRESSINGS) IMPLANT
SUT ETHILON 3 0 FSL (SUTURE) ×3 IMPLANT
SUT MON AB 2-0 CT1 36 (SUTURE) ×2 IMPLANT
SUT SILK 3 0 TIES 10X30 (SUTURE) ×1 IMPLANT
SUT STRATAFIX MNCRL+ 3-0 PS-2 (SUTURE)
SUT STRATAFIX MONOCRYL 3-0 (SUTURE)
SUT VIC AB 0 CT1 18XCR BRD8 (SUTURE) ×1 IMPLANT
SUT VIC AB 0 CT1 8-18 (SUTURE) ×6
SUT VIC AB 2-0 CT1 18 (SUTURE) ×4 IMPLANT
SUT VIC AB 4-0 PS2 27 (SUTURE) IMPLANT
SUT VICRYL 3-0 RB1 18 ABS (SUTURE) ×2 IMPLANT
SUTURE STRATFX MNCRL+ 3-0 PS-2 (SUTURE) IMPLANT
SYR 30ML LL (SYRINGE) ×4 IMPLANT
SYR 5ML LL (SYRINGE) ×2 IMPLANT
SYR CONTROL 10ML LL (SYRINGE) ×5 IMPLANT
SYSTEM LIMITORR VOL LMT 20ML (CATHETERS) ×1 IMPLANT
TOWEL GREEN STERILE (TOWEL DISPOSABLE) ×3 IMPLANT
TOWEL GREEN STERILE FF (TOWEL DISPOSABLE) ×3 IMPLANT
TUBE CONNECTING 12X1/4 (SUCTIONS) ×2 IMPLANT
WATER STERILE IRR 1000ML POUR (IV SOLUTION) ×2 IMPLANT

## 2017-09-07 NOTE — Anesthesia Preprocedure Evaluation (Addendum)
Anesthesia Evaluation  Patient identified by MRN, date of birth, ID band Patient awake    Reviewed: Allergy & Precautions, NPO status , Patient's Chart, lab work & pertinent test results  History of Anesthesia Complications Negative for: history of anesthetic complications  Airway Mallampati: II  TM Distance: >3 FB Neck ROM: Full    Dental no notable dental hx. (+) Dental Advisory Given   Pulmonary COPD, Current Smoker,  Instructed not to smoke, but did smoke DOS   Pulmonary exam normal        Cardiovascular negative cardio ROS Normal cardiovascular exam     Neuro/Psych  Headaches, PSYCHIATRIC DISORDERS Depression  Neuromuscular disease negative psych ROS   GI/Hepatic negative GI ROS, Neg liver ROS,   Endo/Other  negative endocrine ROS  Renal/GU negative Renal ROS  negative genitourinary   Musculoskeletal negative musculoskeletal ROS (+)   Abdominal   Peds negative pediatric ROS (+)  Hematology negative hematology ROS (+)   Anesthesia Other Findings   Reproductive/Obstetrics negative OB ROS                             Lab Results  Component Value Date   WBC 13.1 (H) 08/31/2017   HGB 13.6 08/31/2017   HCT 40.6 08/31/2017   MCV 96.4 08/31/2017   PLT 231 08/31/2017   Lab Results  Component Value Date   CREATININE 0.94 08/31/2017   BUN 11 08/31/2017   NA 135 08/31/2017   K 4.1 08/31/2017   CL 103 08/31/2017   CO2 24 08/31/2017    Anesthesia Physical  Anesthesia Plan  ASA: III  Anesthesia Plan: General   Post-op Pain Management:    Induction: Intravenous  PONV Risk Score and Plan: 2 and Ondansetron and Dexamethasone  Airway Management Planned: Oral ETT  Additional Equipment:   Intra-op Plan:   Post-operative Plan: Extubation in OR  Informed Consent: I have reviewed the patients History and Physical, chart, labs and discussed the procedure including the risks,  benefits and alternatives for the proposed anesthesia with the patient or authorized representative who has indicated his/her understanding and acceptance.   Dental advisory given  Plan Discussed with: CRNA and Anesthesiologist  Anesthesia Plan Comments:         Anesthesia Quick Evaluation

## 2017-09-07 NOTE — Anesthesia Procedure Notes (Signed)
Procedure Name: Intubation Date/Time: 09/07/2017 3:52 PM Performed by: Shirlyn Goltz, CRNA Pre-anesthesia Checklist: Patient identified, Emergency Drugs available, Suction available and Patient being monitored Patient Re-evaluated:Patient Re-evaluated prior to induction Oxygen Delivery Method: Circle system utilized Preoxygenation: Pre-oxygenation with 100% oxygen Induction Type: IV induction Laryngoscope Size: Mac and 4 Grade View: Grade I Tube type: Oral Tube size: 7.5 mm Number of attempts: 1 Airway Equipment and Method: Stylet Placement Confirmation: ETT inserted through vocal cords under direct vision and positive ETCO2 Secured at: 22 cm Tube secured with: Tape Dental Injury: Teeth and Oropharynx as per pre-operative assessment

## 2017-09-07 NOTE — Brief Op Note (Signed)
09/06/2017 - 09/07/2017  5:06 PM  PATIENT:  Gabriel Burns  46 y.o. male  PRE-OPERATIVE DIAGNOSIS:  Wound dehisence  POST-OPERATIVE DIAGNOSIS:  Wound dehisence  PROCEDURE:  Procedure(s): Lumbar wound exploration with placement of lumbar drain (N/A) PLACEMENT OF LUMBAR DRAIN (N/A)  SURGEON:  Surgeon(s) and Role:    * Ditty, Loura HaltBenjamin Jared, MD - Primary  PHYSICIAN ASSISTANT: Cindra PresumeVincent Costella, PA-C  ASSISTANTS: Cindra PresumeVincent Costella, PA-C  ANESTHESIA:   general  EBL:  100 mL   BLOOD ADMINISTERED:none  DRAINS: Lumbar drain   LOCAL MEDICATIONS USED:  MARCAINE    and LIDOCAINE   SPECIMEN:  No Specimen  DISPOSITION OF SPECIMEN:  N/A  COUNTS:  YES  TOURNIQUET:  * No tourniquets in log *  DICTATION: .Dragon Dictation  PLAN OF CARE: Admit to inpatient   PATIENT DISPOSITION:  ICU - extubated and stable.   Delay start of Pharmacological VTE agent (>24hrs) due to surgical blood loss or risk of bleeding: yes

## 2017-09-07 NOTE — H&P (Signed)
CC: CSF leak  HPI: Gabriel Burns is a 46 y.o. 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  He underwent repair one week ago but his pseudomeningocele recurred.  He started leaking yesterday.  PMH: Past Medical History:  Diagnosis Date  . Chronic lower back pain   . COPD (chronic obstructive pulmonary disease) (HCC)   . Depression   . History of kidney stones   . Migraine    "2-4 times/year" (08/30/2017)  . Multiple sclerosis (HCC)   . 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 Spinal headache    woke up during procedure    PSH: Past Surgical History:  Procedure Laterality Date  . BACK SURGERY    . EPIDURAL BLOOD PATCH  ~ 11//2018   "for CSF leak"  . LITHOTRIPSY    . LUMBAR LAMINECTOMY  07/24/2017   "took piece off vertebrae; had a bulging herniated disc"  . REPAIR OF CEREBROSPINAL FLUID LEAK N/A 08/30/2017   Procedure: 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;  Surgeon: Zylah Elsbernd, Loura Halt, MD;  Location: [facility] OR;  Service: Neurosurgery;  Laterality: N/A;  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  . WOUND EXPLORATION  08/30/2017   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/notes 08/30/2017    SH: Social History   Tobacco Use  . Smoking status: Current Every Day Smoker    Packs/day: 1.00    Years: 30.00    Pack years: 30.00    Types: Cigars, Cigarettes  . Smokeless tobacco: Never Used  Substance Use Topics  . Alcohol use: No    Frequency: Never  . Drug use: Yes    Types: Marijuana    Comment: 08/30/2017 "couple times/day"    MEDS: Prior to Admission medications   Medication Sig Start Date End Date Taking? Authorizing Provider  amphetamine-dextroamphetamine (ADDERALL) 20 MG tablet Take 1 tablet (20 mg total) by mouth daily. Patient taking differently: Take 20 mg 2 (two) times daily by mouth.  08/12/17   Nche, Bonna Gains, NP  gabapentin (NEURONTIN) 300 MG capsule Take 1 capsule (300 mg total) by mouth at  bedtime. Patient taking differently: Take 300 mg 2 (two) times daily by mouth.  08/12/17   Nche, Bonna Gains, NP  HYDROcodone-acetaminophen (NORCO/VICODIN) 5-325 MG tablet Take 1 tablet by mouth every 4 (four) hours as needed for moderate pain ((score 4 to 6)). 09/01/17   Donalee Citrin, MD  oxybutynin (DITROPAN-XL) 5 MG 24 hr tablet Take 1 tablet (5 mg total) by mouth at bedtime. 08/12/17   Nche, Bonna Gains, NP  Oxycodone HCl 10 MG TABS Take 1 tablet (10 mg total) by mouth 3 (three) times daily. 08/12/17   Nche, Bonna Gains, NP  sildenafil (REVATIO) 20 MG tablet Take 1-2tabs prior to activity as needed. 08/12/17   Nche, Bonna Gains, NP    ALLERGY: No Known Allergies  ROS: ROS  NEUROLOGIC EXAM: Awake, alert, oriented Memory and concentration grossly intact Speech fluent, appropriate CN grossly intact Motor exam: Upper Extremities Deltoid Bicep Tricep Grip  Right 5/5 5/5 5/5 5/5  Left 5/5 5/5 5/5 5/5   Lower Extremity IP Quad PF DF EHL  Right 5/5 5/5 5/5 5/5 5/5  Left 5/5 5/5 5/5 5/5 5/5   Sensation grossly intact to LT  IMAGING: No new imaging  IMPRESSION: - 46 y.o. male with CSF leak  PLAN: - Wound exploration, placement of lumbar drain - We have discussed the risks, benefits, and alternatives to surgery and he wishes to proceed

## 2017-09-07 NOTE — Progress Notes (Signed)
Patient asking for nicotine patch tonight.Call placed to [facility].New order received and carried out.

## 2017-09-07 NOTE — Transfer of Care (Signed)
Immediate Anesthesia Transfer of Care Note  Patient: Gabriel Burns  Procedure(s) Performed: Lumbar wound exploration with placement of lumbar drain (N/A Back) PLACEMENT OF LUMBAR DRAIN (N/A Back)  Patient Location: PACU  Anesthesia Type:General  Level of Consciousness: drowsy  Airway & Oxygen Therapy: Patient Spontanous Breathing and Patient connected to face mask oxygen  Post-op Assessment: Report given to RN and Post -op Vital signs reviewed and stable  Post vital signs: Reviewed and stable  Last Vitals:  Vitals:   09/07/17 0458 09/07/17 1004  BP: 108/60 127/63  Pulse: 75 87  Resp: 18 16  Temp: 36.5 C 36.7 C  SpO2: 97% 100%    Last Pain:  Vitals:   09/07/17 1004  TempSrc: Oral  PainSc:       Patients Stated Pain Goal: 3 (09/07/17 0704)  Complications: No apparent anesthesia complications

## 2017-09-08 ENCOUNTER — Encounter (HOSPITAL_COMMUNITY): Payer: Self-pay | Admitting: Neurological Surgery

## 2017-09-08 MED ORDER — HYDROMORPHONE HCL 1 MG/ML IJ SOLN
1.0000 mg | INTRAMUSCULAR | Status: DC | PRN
  Administered 2017-09-08 – 2017-09-11 (×9): 1 mg via INTRAVENOUS
  Filled 2017-09-08 (×9): qty 1

## 2017-09-08 MED ORDER — OXYCODONE HCL 5 MG PO TABS
5.0000 mg | ORAL_TABLET | ORAL | Status: DC | PRN
  Administered 2017-09-08 – 2017-09-10 (×11): 10 mg via ORAL
  Filled 2017-09-08 (×11): qty 2

## 2017-09-08 NOTE — Anesthesia Postprocedure Evaluation (Signed)
Anesthesia Post Note  Patient: Gabriel Burns  Procedure(s) Performed: Lumbar wound exploration with placement of lumbar drain (N/A Back) PLACEMENT OF LUMBAR DRAIN (N/A Back)     Patient location during evaluation: PACU Anesthesia Type: General Level of consciousness: awake and alert Pain management: pain level controlled Vital Signs Assessment: post-procedure vital signs reviewed and stable Respiratory status: spontaneous breathing, nonlabored ventilation and respiratory function stable Cardiovascular status: blood pressure returned to baseline and stable Postop Assessment: no apparent nausea or vomiting Anesthetic complications: no    Last Vitals:  Vitals:   09/08/17 0500 09/08/17 0600  BP: 120/82 121/74  Pulse: 87 83  Resp: 11 12  Temp:    SpO2: 98% 99%    Last Pain:  Vitals:   09/08/17 0609  TempSrc:   PainSc: 3                  Beryle Lathe

## 2017-09-08 NOTE — Progress Notes (Signed)
Patient's lumbar drainage has 0 ml drainage. PA paged and was made aware.

## 2017-09-08 NOTE — Progress Notes (Signed)
Lumbar drain temporarily stopped working overnight, functioning now At neurological baseline Incision c/d/i and flat Continue lumbar drain Continue to lay flat in bed, may walk to the bathroom for bowel movements

## 2017-09-08 NOTE — Care Management Note (Signed)
Case Management Note  Patient Details  Name: Gabriel Burns MRN: [id number] Date of Birth: Aug 17, 1971  Subjective/Objective:    Gabriel Burns is a 46 y.o. 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  He underwent repair one week ago but his pseudomeningocele recurred.  Lumbar drain placed on 09/07/17.  PTA, pt lives at home with 93 year old son.                    Action/Plan: Will follow for discharge planning as pt progresses.  May consider PT/OT consults after bedrest is lifted and LD removed.    Expected Discharge Date:                  Expected Discharge Plan:     In-House Referral:     Discharge planning Services  CM Consult  Post Acute Care Choice:    Choice offered to:     DME Arranged:    DME Agency:     HH Arranged:    HH Agency:     Status of Service:  In process, will continue to follow  If discussed at Long Length of Stay Meetings, dates discussed:    Additional Comments:  Quintella Baton, RN, BSN  Trauma/Neuro ICU Case Manager [phone]

## 2017-09-09 LAB — GLUCOSE, CAPILLARY: GLUCOSE-CAPILLARY: 83 mg/dL (ref 65–99)

## 2017-09-09 LAB — MRSA PCR SCREENING: MRSA by PCR: NEGATIVE

## 2017-09-09 NOTE — Progress Notes (Signed)
No acute events Drain working well Neurologically at baseline Incision with some dried blood, flat, no csf leak Keep lumbar drain for one more day, likely d/c tomorrow afternoon if up and moving well after drain removal

## 2017-09-09 NOTE — Plan of Care (Signed)
Will continue to educate pt on post-surgical care.

## 2017-09-10 LAB — GLUCOSE, CAPILLARY: GLUCOSE-CAPILLARY: 89 mg/dL (ref 65–99)

## 2017-09-10 MED ORDER — BISACODYL 10 MG RE SUPP
10.0000 mg | Freq: Once | RECTAL | Status: AC
Start: 2017-09-10 — End: 2017-09-10
  Administered 2017-09-10: 10 mg via RECTAL
  Filled 2017-09-10: qty 1

## 2017-09-10 NOTE — Progress Notes (Signed)
Patient ID: Gabriel Burns, male   DOB: 04-18-1971, 46 y.o.   MRN: [id number] Subjective: The patient is alert and pleasant.  He wants to mobilize but "does not want this to happen again", referring to his pseudomeningocele.  Objective: Vital signs in last 24 hours: Temp:  [97.9 F (36.6 C)-98.5 F (36.9 C)] 97.9 F (36.6 C) (12/01 0300) Pulse Rate:  [67-98] 67 (12/01 0700) Resp:  [9-20] 9 (12/01 0700) BP: (110-134)/(66-95) 114/76 (12/01 0700) SpO2:  [97 %-100 %] 99 % (12/01 0700)  Intake/Output from previous day: 11/30 0701 - 12/01 0700 In: 680 [P.O.:680] Out: 1924 [Urine:1675; Drains:249] Intake/Output this shift: No intake/output data recorded.  Physical exam the patient is alert and oriented.  He is moving her lower extremities well.  His lumbar incision is flat without drainage.  The lumbar drain is in place.  Lab Results: No results for input(s): WBC, HGB, HCT, PLT in the last 72 hours. BMET No results for input(s): NA, K, CL, CO2, GLUCOSE, BUN, CREATININE, CALCIUM in the last 72 hours.  Studies/Results: No results found.  Assessment/Plan: Postop day #3: I have turned the patient's lumbar drain off.  We will mobilize him.  If the patient does not develop any drainage or recurrent subcutaneous fluid collection then I will plan to remove his drain and send him home tomorrow.  I have answered all his questions.  LOS: 4 days     Cristi Loron 09/10/2017, 8:55 AM

## 2017-09-11 MED ORDER — OXYCODONE HCL 5 MG PO TABS: 5.0000 mg | ORAL_TABLET | ORAL | 0 refills | Status: DC | PRN

## 2017-09-11 NOTE — Discharge Summary (Signed)
Physician Discharge Summary  Patient ID: Gabriel Burns MRN: [id number] DOB/AGE: 06/23/1971 46 y.o.  Admit date: 09/06/2017 Discharge date: 09/11/2017  Admission Diagnoses: Lumbar pseudomeningocele  Discharge Diagnoses: The same Active Problems:   Wound drainage   Discharged Condition: good  Hospital Course: The patient was admitted on 09/06/2017 with a recurrent lumbar pseudomeningocele.  Dr. Bevely Palmer performed an exploration of his lumbar wound with the placement of a lumbar drain on 09/07/2017.  The lumbar drain was continued until 09/10/2017.  On that day the drain was closed and the patient was mobilized.  The patient did not develop a postural headache or any evidence of recurrent subcutaneous fluid collection.  His lumbar drain was removed on 09/11/17.  The patient was discharged to home.  He was given written and oral discharge instructions.  All his questions were answered.  Consults: None Significant Diagnostic Studies: None Treatments: Exploration of lumbar wound, repair of pseudomeningocele, placement of lumbar drain Discharge Exam: Blood pressure 107/72, pulse 67, temperature 97.7 F (36.5 C), temperature source Oral, resp. rate 11, height 5\' 10"  (1.778 m), weight 75.2 kg (165 lb 12.6 oz), SpO2 97 %. The patient is alert and pleasant.  He is moving all 4 extremities well.  His wound is healing well without drainage or evidence of fluid collection.  I remove the drain.  Disposition: Home  Discharge Instructions    Call MD for:  difficulty breathing, headache or visual disturbances   Complete by:  As directed    Call MD for:  extreme fatigue   Complete by:  As directed    Call MD for:  hives   Complete by:  As directed    Call MD for:  persistant dizziness or light-headedness   Complete by:  As directed    Call MD for:  persistant nausea and vomiting   Complete by:  As directed    Call MD for:  redness, tenderness, or signs of infection (pain, swelling, redness, odor  or green/yellow discharge around incision site)   Complete by:  As directed    Call MD for:  severe uncontrolled pain   Complete by:  As directed    Call MD for:  temperature >100.4   Complete by:  As directed    Diet - low sodium heart healthy   Complete by:  As directed    Discharge instructions   Complete by:  As directed    Call [phone] for a followup appointment. Take a stool softener while you are using pain medications.   Driving Restrictions   Complete by:  As directed    Do not drive for 2 weeks.   Increase activity slowly   Complete by:  As directed    Lifting restrictions   Complete by:  As directed    Do not lift more than 5 pounds. No excessive bending or twisting.   May shower / Bathe   Complete by:  As directed    He may shower after the pain she is removed 3 days after surgery. Leave the incision alone.   Remove dressing in 24 hours   Complete by:  As directed      Allergies as of 09/11/2017   No Known Allergies     Medication List    STOP taking these medications   HYDROcodone-acetaminophen 5-325 MG tablet Commonly known as:  NORCO/VICODIN     TAKE these medications   amphetamine-dextroamphetamine 20 MG tablet Commonly known as:  ADDERALL Take 1 tablet (20 mg total)  by mouth daily. What changed:  when to take this   gabapentin 300 MG capsule Commonly known as:  NEURONTIN Take 1 capsule (300 mg total) by mouth at bedtime. What changed:  when to take this   oxybutynin 5 MG 24 hr tablet Commonly known as:  DITROPAN-XL Take 1 tablet (5 mg total) by mouth at bedtime.   Oxycodone HCl 10 MG Tabs Take 1 tablet (10 mg total) by mouth 3 (three) times daily. What changed:  Another medication with the same name was added. Make sure you understand how and when to take each.   oxyCODONE 5 MG immediate release tablet Commonly known as:  Oxy IR/ROXICODONE Take 1-2 tablets (5-10 mg total) by mouth every 4 (four) hours as needed for moderate pain. What  changed:  You were already taking a medication with the same name, and this prescription was added. Make sure you understand how and when to take each.   sildenafil 20 MG tablet Commonly known as:  REVATIO Take 1-2tabs prior to activity as needed. What changed:    how much to take  how to take this  when to take this  reasons to take this  additional instructions        Signed: Cristi LoronJeffrey D Reanne Nellums 09/11/2017, 7:55 AM

## 2017-09-12 LAB — GLUCOSE, CAPILLARY: GLUCOSE-CAPILLARY: 81 mg/dL (ref 65–99)

## 2017-09-13 DIAGNOSIS — G9619 Other disorders of meninges, not elsewhere classified: Secondary | ICD-10-CM | POA: Diagnosis not present

## 2017-09-13 DIAGNOSIS — G35 Multiple sclerosis: Secondary | ICD-10-CM | POA: Diagnosis not present

## 2017-09-13 DIAGNOSIS — N521 Erectile dysfunction due to diseases classified elsewhere: Secondary | ICD-10-CM | POA: Diagnosis not present

## 2017-09-13 DIAGNOSIS — M5126 Other intervertebral disc displacement, lumbar region: Secondary | ICD-10-CM | POA: Diagnosis not present

## 2017-09-19 NOTE — Op Note (Signed)
08/30/2017  11:55 AM  PATIENT:  Gabriel Burns  46 y.o. male  PRE-OPERATIVE DIAGNOSIS:  Post-operative pseudomeningocele  POST-OPERATIVE DIAGNOSIS:  Same  PROCEDURE:  Wound exploration for repair of pseudomeningocele  SURGEON:  Hulan SaasBenjamin J. Litha Lamartina, MD  ASSISTANTS: Cindra PresumeVincent Costella, PA-C  ANESTHESIA:   General  DRAINS: None   SPECIMEN:  None  INDICATION FOR PROCEDURE: 46 year old male with a pseudomeningocele after a lumbar laminoforaminotomy for decompression.  It did not improve after a blood patch.  I recommended the above procedure. Patient understood the risks, benefits, and alternatives and potential outcomes and wished to proceed.  PROCEDURE DETAILS: After smooth induction of general endotracheal anesthesia the patient was turned prone on the OR table on a Wilson frame.  The skin was prepped and draped in the usual sterile fashion.  The scar was excised.  Monopolar cautery was used to dissect in the midline to the spinous processes.  I encountered a pocket of clear fluid.  This was suction aspirated to clear the field.  Retractors were placed.  The thecal sac was identified.  I removed some loosely adherent fibrinous debris from the epidural space.  The thecal sac was inspected.  There was no leak with valsalva but I was able to identify a tiny pinhole on the dorsal surface of the thecal sac which leaked when the sac was manipulated around hole.  This was closed with a figure of 8 4-0 nurolon suture.  I irrigated with bacitracin saline.  I placed duramatrix in the epidural space.  I sealed this with Adherus.  I then closed the wound in routine anatomic layers with interrupted vicryl sutures.  I injected a mixture of exparel, marcaine, and saline in the paraspinous muscles and subcutaneous tissues.  I closed the skin with a running, locked monocryl suture.  Dermabond and a sterile dressing was applied.  PATIENT DISPOSITION:  PACU - hemodynamically stable.   Delay start of  Pharmacological VTE agent (>24hrs) due to surgical blood loss or risk of bleeding:  yes

## 2017-09-20 NOTE — Op Note (Signed)
09/07/2017  9:22 AM  PATIENT:  Gabriel Burns  46 y.o. male  PRE-OPERATIVE DIAGNOSIS:  Recurrent post-operative pseudomeningocele  POST-OPERATIVE DIAGNOSIS:  Same  PROCEDURE:  Lumbar wound exploration, repair of CSF fistula, placement of lumbar drain  SURGEON:  Hulan Saas, MD  ASSISTANTS: Cindra Presume, PA-C  ANESTHESIA:   General  DRAINS: Lumbar drain   SPECIMEN:  None  INDICATION FOR PROCEDURE: 46 year old male with a recurrent pseudomeningocele.  I recommended the above procedure. Patient understood the risks, benefits, and alternatives and potential outcomes and wished to proceed.  PROCEDURE DETAILS: After smooth induction of general endotracheal anesthesia the patient was turned prone on the OR table on a Wilson frame.  The skin was prepped and draped in the usual sterile fashion.  The scar was excised.  Monopolar cautery was used to dissect in the midline to the spinous processes.  I encountered a pocket of clear fluid.  This was suction aspirated to clear the field.  Retractors were placed.  The thecal sac was identified.  I removed some loosely adherent fibrinous debris from the epidural space.  The thecal sac was inspected.  The prior durotomy repair was identified and did not leak with valsalva or manipulation.  I irrigated with bacitracin saline.  I replaced placed duramatrix in the epidural space.  I sealed this with DuraSeal.  I then placed a lumbar drain using a Tuohy needle on the other side.  It tunneled out the skin to a remote exit site.  It was secured with a purse string suture.  I then closed the wound in routine anatomic layers with interrupted vicryl sutures.  I closed the skin with a running, locked monocryl suture.  Dermabond and a sterile dressing was applied.  The patient awoke without difficulty.  PATIENT DISPOSITION:  ICU - extubated and stable.   Delay start of Pharmacological VTE agent (>24hrs) due to surgical blood loss or risk of bleeding:   yes

## 2017-10-10 ENCOUNTER — Ambulatory Visit: Payer: Medicare Other | Admitting: Nurse Practitioner

## 2018-03-23 ENCOUNTER — Other Ambulatory Visit: Payer: Self-pay | Admitting: Gastroenterology

## 2018-03-23 DIAGNOSIS — B1920 Unspecified viral hepatitis C without hepatic coma: Secondary | ICD-10-CM

## 2018-04-05 ENCOUNTER — Encounter (HOSPITAL_COMMUNITY): Payer: Self-pay | Admitting: *Deleted

## 2018-04-05 ENCOUNTER — Emergency Department (HOSPITAL_COMMUNITY)
Admission: EM | Admit: 2018-04-05 | Discharge: 2018-04-06 | Disposition: A | Discharge status: Home / Self Care | Payer: Medicare HMO | Attending: Emergency Medicine | Admitting: Emergency Medicine

## 2018-04-05 ENCOUNTER — Other Ambulatory Visit: Payer: Self-pay

## 2018-04-05 DIAGNOSIS — Y999 Unspecified external cause status: Secondary | ICD-10-CM | POA: Insufficient documentation

## 2018-04-05 DIAGNOSIS — Z79899 Other long term (current) drug therapy: Secondary | ICD-10-CM | POA: Insufficient documentation

## 2018-04-05 DIAGNOSIS — S51811A Laceration without foreign body of right forearm, initial encounter: Secondary | ICD-10-CM | POA: Diagnosis not present

## 2018-04-05 DIAGNOSIS — W228XXA Striking against or struck by other objects, initial encounter: Secondary | ICD-10-CM | POA: Insufficient documentation

## 2018-04-05 DIAGNOSIS — G8929 Other chronic pain: Secondary | ICD-10-CM | POA: Diagnosis not present

## 2018-04-05 DIAGNOSIS — J449 Chronic obstructive pulmonary disease, unspecified: Secondary | ICD-10-CM | POA: Insufficient documentation

## 2018-04-05 DIAGNOSIS — Y939 Activity, unspecified: Secondary | ICD-10-CM | POA: Insufficient documentation

## 2018-04-05 DIAGNOSIS — Y929 Unspecified place or not applicable: Secondary | ICD-10-CM | POA: Insufficient documentation

## 2018-04-05 DIAGNOSIS — M549 Dorsalgia, unspecified: Secondary | ICD-10-CM | POA: Insufficient documentation

## 2018-04-05 DIAGNOSIS — S59911A Unspecified injury of right forearm, initial encounter: Secondary | ICD-10-CM | POA: Diagnosis present

## 2018-04-05 MED ORDER — LIDOCAINE HCL (PF) 1 % IJ SOLN
5.0000 mL | Freq: Once | INTRAMUSCULAR | Status: AC
  Administered 2018-04-05: 5 mL
  Filled 2018-04-05: qty 5

## 2018-04-05 NOTE — ED Triage Notes (Signed)
Pt was lifting a grill and cut his R wrist. ~2 inch lac noted, bleeding controlled, bandage applied

## 2018-04-05 NOTE — Discharge Instructions (Signed)
Treatment: Keep your wound dry and dressing applied until this time tomorrow. After 24 hours, you may wash with warm soapy water. Dry and apply antibiotic ointment and clean dressing. Do this daily until your sutures are removed. ° °Follow-up: Please follow-up with your primary care provider or return to emergency department in 10-14 days for suture removal. Be aware of signs of infection: fever, increasing pain, redness, swelling, drainage from the area. Please call your primary care provider or return to emergency department if you develop any of these symptoms or if any of the sutures come out prior to removal. Please return to the emergency department if you develop any other new or worsening symptoms. ° °

## 2018-04-06 NOTE — ED Notes (Signed)
ED Provider at bedside. 

## 2018-04-06 NOTE — ED Notes (Signed)
Pt verbalizes understanding of d/c instructions. Pt ambulatory at d/c with all belongings and with family.   

## 2018-04-06 NOTE — ED Notes (Signed)
Bacitracin applied to pt's wound. Dressed with non-adherent dressing and wrapped in gauze.

## 2018-04-06 NOTE — ED Provider Notes (Signed)
[facility] EMERGENCY DEPARTMENT Provider Note   CSN: [id number] Arrival date & time: 04/05/18  2004     History   Chief Complaint Chief Complaint  Patient presents with  . Laceration    HPI Gabriel Burns is a 47 y.o. male with history of MS, COPD, chronic back pain who presents with laceration to right forearm.  Patient reports he was picking up a grill and it slipped and hit his arm.  He describes burning pain to the laceration.  He denies any pain elsewhere.  He denies any numbness or tingling.  Per chart review, his tetanus is up-to-date.  No interventions taken prior to arrival.  HPI  Past Medical History:  Diagnosis Date  . Chronic lower back pain   . COPD (chronic obstructive pulmonary disease) (HCC)   . Depression   . History of kidney stones   . Migraine    "2-4 times/year" (08/30/2017)  . Multiple sclerosis (HCC)   . 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 Spinal headache    woke up during procedure    Patient Active Problem List   Diagnosis Date Noted  . Wound drainage 09/06/2017  . 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 Bulging lumbar disc 04/04/2017  . Acute right-sided thoracic back pain 04/04/2017  . Sciatica of right side 10/29/2016  . Alcohol abuse 10/08/2016  . Erectile dysfunction 02/09/2016  . Depression 01/08/2016  . MS (multiple sclerosis) (HCC) 05/22/2015  . COPD (chronic obstructive pulmonary disease) (HCC) 05/22/2015  . Tobacco use disorder 05/22/2015  . Chronic pain 05/22/2015    Past Surgical History:  Procedure Laterality Date  . BACK SURGERY    . EPIDURAL BLOOD PATCH  ~ 11//2018   "for CSF leak"  . LITHOTRIPSY    . LUMBAR LAMINECTOMY  07/24/2017   "took piece off vertebrae; had a bulging herniated disc"  . LUMBAR WOUND DEBRIDEMENT N/A 09/07/2017   Procedure: Lumbar wound exploration with placement of lumbar drain;  Surgeon: Ditty, Loura Halt, MD;  Location: [facility] [place] OR;  Service: Neurosurgery;  Laterality: N/A;  . PLACEMENT  OF LUMBAR DRAIN N/A 09/07/2017   Procedure: PLACEMENT OF LUMBAR DRAIN;  Surgeon: Ditty, Loura Halt, MD;  Location: [facility] OR;  Service: Neurosurgery;  Laterality: N/A;  . REPAIR OF CEREBROSPINAL FLUID LEAK N/A 08/30/2017   Procedure: 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;  Surgeon: Ditty, Loura Halt, MD;  Location: [facility] OR;  Service: Neurosurgery;  Laterality: N/A;  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  . WOUND EXPLORATION  08/30/2017   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/notes 08/30/2017        Home Medications    Prior to Admission medications   Medication Sig Start Date End Date Taking? Authorizing Provider  amphetamine-dextroamphetamine (ADDERALL) 20 MG tablet Take 1 tablet (20 mg total) by mouth daily. Patient taking differently: Take 20 mg 2 (two) times daily by mouth.  08/12/17   Nche, Bonna Gains, NP  gabapentin (NEURONTIN) 300 MG capsule Take 1 capsule (300 mg total) by mouth at bedtime. Patient taking differently: Take 300 mg 2 (two) times daily by mouth.  08/12/17   Nche, Bonna Gains, NP  oxybutynin (DITROPAN-XL) 5 MG 24 hr tablet Take 1 tablet (5 mg total) by mouth at bedtime. 08/12/17   Nche, Bonna Gains, NP  oxyCODONE (OXY IR/ROXICODONE) 5 MG immediate release tablet Take 1-2 tablets (5-10 mg total) by mouth every 4 (four) hours as needed for moderate pain. 09/11/17   Tressie Stalker, MD  Oxycodone HCl 10 MG TABS Take 1 tablet (10 mg total) by mouth 3 (three) times daily. 08/12/17   Nche, Bonna Gains, NP  sildenafil (REVATIO) 20 MG tablet Take 1-2tabs prior to activity as needed. Patient taking differently: Take 20-40 mg by mouth daily as needed (Prior to sexual activity).  08/12/17   Nche, Bonna Gains, NP    Family History Family History  Problem Relation Age of Onset  . Hypertension Father   . Alcohol abuse Paternal Grandfather     Social History Social History   Tobacco Use  . Smoking  status: Current Every Day Smoker    Packs/day: 1.00    Years: 30.00    Pack years: 30.00    Types: Cigars, Cigarettes  . Smokeless tobacco: Never Used  Substance Use Topics  . Alcohol use: No    Frequency: Never  . Drug use: Yes    Types: Marijuana    Comment: 08/30/2017 "couple times/day"     Allergies   Patient has no known allergies.   Review of Systems Review of Systems  Skin: Positive for wound.  Neurological: Negative for numbness.     Physical Exam Updated Vital Signs BP 126/78 (BP Location: Left Arm)   Pulse 86   Temp 98.1 F (36.7 C) (Oral)   Resp 14   SpO2 100%   Physical Exam  Constitutional: He appears well-developed and well-nourished. No distress.  HENT:  Head: Normocephalic and atraumatic.  Mouth/Throat: Oropharynx is clear and moist. No oropharyngeal exudate.  Eyes: Pupils are equal, round, and reactive to light. Conjunctivae are normal. Right eye exhibits no discharge. Left eye exhibits no discharge. No scleral icterus.  Neck: Normal range of motion. Neck supple. No thyromegaly present.  Cardiovascular: Normal rate, regular rhythm, normal heart sounds and intact distal pulses. Exam reveals no gallop and no friction rub.  No murmur heard. Pulmonary/Chest: Effort normal and breath sounds normal. No stridor. No respiratory distress. He has no wheezes. He has no rales.  Musculoskeletal: He exhibits no edema.  Lymphadenopathy:    He has no cervical adenopathy.  Neurological: He is alert. Coordination normal.  Skin: Skin is warm and dry. No rash noted. He is not diaphoretic. No pallor.  2-1/2 cm superficial laceration to radial aspect of mid right forearm Tattoo sleeves bilaterally  Psychiatric: He has a normal mood and affect.  Nursing note and vitals reviewed.    ED Treatments / Results  Labs (all labs ordered are listed, but only abnormal results are displayed) Labs Reviewed - No data to display  EKG None  Radiology No results  found.  Procedures .Marland KitchenLaceration Repair Date/Time: 04/06/2018 1:00 AM Performed by: Emi Holes, PA-C Authorized by: Emi Holes, PA-C   Consent:    Consent obtained:  Verbal   Consent given by:  Patient   Risks discussed:  Infection, poor cosmetic result and pain Anesthesia (see MAR for exact dosages):    Anesthesia method:  Local infiltration   Local anesthetic:  Lidocaine 1% w/o epi Laceration details:    Location:  Shoulder/arm   Shoulder/arm location:  R lower arm   Length (cm):  2.5   Depth (mm):  3 Repair type:    Repair type:  Simple Pre-procedure details:    Preparation:  Patient was prepped and draped in usual sterile fashion Exploration:    Wound extent: no areolar tissue violation noted, no fascia violation noted, no foreign bodies/material noted, no muscle damage noted, no nerve damage noted, no tendon damage  noted and no vascular damage noted     Contaminated: no   Treatment:    Area cleansed with:  Saline   Amount of cleaning:  Standard   Irrigation solution:  Sterile saline   Irrigation volume:  100 mL   Irrigation method:  Syringe   Visualized foreign bodies/material removed: no   Skin repair:    Repair method:  Sutures   Suture size:  5-0   Wound skin closure material used: Ethilon.   Suture technique:  Simple interrupted   Number of sutures:  4 Approximation:    Approximation:  Close Post-procedure details:    Dressing:  Antibiotic ointment and non-adherent dressing   Patient tolerance of procedure:  Tolerated well, no immediate complications Comments:     For suture to approximate tattoo completed by me.  Remaining sutures completed by PA student, Rayfield Citizen, under my supervision.   (including critical care time)  Medications Ordered in ED Medications  lidocaine (PF) (XYLOCAINE) 1 % injection 5 mL (5 mLs Infiltration Given 04/05/18 2237)     Initial Impression / Assessment and Plan / ED Course  I have reviewed the triage vital signs  and the nursing notes.  Pertinent labs & imaging results that were available during my care of the patient were reviewed by me and considered in my medical decision making (see chart for details).     Patient with superficial laceration.  No bony tenderness.  No concern for foreign body.  Tetanus UTD. Laceration occurred < 12 hours prior to repair. Discussed laceration care with pt and answered questions. Pt to f-u for suture removal in 10 days and wound check sooner should there be signs of dehiscence or infection.  Patient understands and agrees with plan.  Pt is hemodynamically stable with no complaints prior to dc.     Final Clinical Impressions(s) / ED Diagnoses   Final diagnoses:  Laceration of right forearm, initial encounter    ED Discharge Orders    None       Emi Holes, PA-C 04/06/18 0103    Rolland Porter, MD 04/07/18 [phone]

## 2018-04-10 ENCOUNTER — Ambulatory Visit
Admission: RE | Admit: 2018-04-10 | Discharge: 2018-04-10 | Disposition: A | Discharge status: Home / Self Care | Payer: Medicare HMO | Source: Ambulatory Visit | Attending: Gastroenterology | Admitting: Gastroenterology

## 2018-04-10 DIAGNOSIS — B1920 Unspecified viral hepatitis C without hepatic coma: Secondary | ICD-10-CM

## 2018-04-18 DIAGNOSIS — S51811A Laceration without foreign body of right forearm, initial encounter: Secondary | ICD-10-CM | POA: Insufficient documentation

## 2018-04-18 NOTE — Progress Notes (Deleted)
Subjective:    Patient ID: Gabriel Burns, male    DOB: [birth year], 47 y.o.   MRN: [id number]  HPI The patient is here for an acute visit.  ED 6/26 for right forearm laceration.  He was picking up a grill and slipped and hit his arm.  He had a burning pain to the laceration.  He had no other injuries.  He denies numbness/tingling.  He had a 2 1/2 cm superficial laceration on the radial aspect of his mid right forearm.  The laceration was repaired with 5 sutures.  Td not given since it was up to date.  He was advised to FU in 10 days to have sutures removed.        Medications and allergies reviewed with patient and updated if appropriate.  Patient Active Problem List   Diagnosis Date Noted  . Wound drainage 09/06/2017  . 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 Bulging lumbar disc 04/04/2017  . Acute right-sided thoracic back pain 04/04/2017  . Sciatica of right side 10/29/2016  . Alcohol abuse 10/08/2016  . Erectile dysfunction 02/09/2016  . Depression 01/08/2016  . MS (multiple sclerosis) (HCC) 05/22/2015  . COPD (chronic obstructive pulmonary disease) (HCC) 05/22/2015  . Tobacco use disorder 05/22/2015  . Chronic pain 05/22/2015    Current Outpatient Medications on File Prior to Visit  Medication Sig Dispense Refill  . amphetamine-dextroamphetamine (ADDERALL) 20 MG tablet Take 1 tablet (20 mg total) by mouth daily. (Patient taking differently: Take 20 mg 2 (two) times daily by mouth. ) 30 tablet 0  . gabapentin (NEURONTIN) 300 MG capsule Take 1 capsule (300 mg total) by mouth at bedtime. (Patient taking differently: Take 300 mg 2 (two) times daily by mouth. ) 30 capsule 0  . oxybutynin (DITROPAN-XL) 5 MG 24 hr tablet Take 1 tablet (5 mg total) by mouth at bedtime. 30 tablet 0  . oxyCODONE (OXY IR/ROXICODONE) 5 MG immediate release tablet Take 1-2 tablets (5-10 mg total) by mouth every 4 (four) hours as needed for moderate pain. 30 tablet 0  . Oxycodone HCl 10 MG TABS Take 1  tablet (10 mg total) by mouth 3 (three) times daily. 90 tablet 0  . sildenafil (REVATIO) 20 MG tablet Take 1-2tabs prior to activity as needed. (Patient taking differently: Take 20-40 mg by mouth daily as needed (Prior to sexual activity). ) 15 tablet 0   No current facility-administered medications on file prior to visit.     Past Medical History:  Diagnosis Date  . Chronic lower back pain   . COPD (chronic obstructive pulmonary disease) (HCC)   . Depression   . History of kidney stones   . Migraine    "2-4 times/year" (08/30/2017)  . Multiple sclerosis (HCC)   . 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 Spinal headache    woke up during procedure    Past Surgical History:  Procedure Laterality Date  . BACK SURGERY    . EPIDURAL BLOOD PATCH  ~ 11//2018   "for CSF leak"  . LITHOTRIPSY    . LUMBAR LAMINECTOMY  07/24/2017   "took piece off vertebrae; had a bulging herniated disc"  . LUMBAR WOUND DEBRIDEMENT N/A 09/07/2017   Procedure: Lumbar wound exploration with placement of lumbar drain;  Surgeon: Ditty, Loura Halt, MD;  Location: [facility] OR;  Service: Neurosurgery;  Laterality: N/A;  . PLACEMENT OF LUMBAR DRAIN N/A 09/07/2017   Procedure: PLACEMENT OF LUMBAR DRAIN;  Surgeon: Ditty, Loura Halt, MD;  Location: [facility] OR;  Service: Neurosurgery;  Laterality: N/A;  . REPAIR OF CEREBROSPINAL FLUID LEAK N/A 08/30/2017   Procedure: 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;  Surgeon: Ditty, Loura Halt, MD;  Location: [facility] OR;  Service: Neurosurgery;  Laterality: N/A;  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  . WOUND EXPLORATION  08/30/2017   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/notes 08/30/2017    Social History   Socioeconomic History  . Marital status: Divorced    Spouse name: Not on file  . Number of children: 3  . Years of education: GED  . Highest education level: Not on file  Occupational History  . Occupation:  Disability     Comment: MS  Social Needs  . Financial resource strain: Not on file  . Food insecurity:    Worry: Not on file    Inability: Not on file  . Transportation needs:    Medical: Not on file    Non-medical: Not on file  Tobacco Use  . Smoking status: Current Every Day Smoker    Packs/day: 1.00    Years: 30.00    Pack years: 30.00    Types: Cigars, Cigarettes  . Smokeless tobacco: Never Used  Substance and Sexual Activity  . Alcohol use: No    Frequency: Never  . Drug use: Yes    Types: Marijuana    Comment: 08/30/2017 "couple times/day"  . Sexual activity: Yes  Lifestyle  . Physical activity:    Days per week: Not on file    Minutes per session: Not on file  . Stress: Not on file  Relationships  . Social connections:    Talks on phone: Not on file    Gets together: Not on file    Attends religious service: Not on file    Active member of club or organization: Not on file    Attends meetings of clubs or organizations: Not on file    Relationship status: Not on file  Other Topics Concern  . Not on file  Social History Narrative   Fun: Draw (anything), realism.   Denies religious beliefs effecting health care.     Family History  Problem Relation Age of Onset  . Hypertension Father   . Alcohol abuse Paternal Grandfather     Review of Systems     Objective:  There were no vitals filed for this visit. BP Readings from Last 3 Encounters:  04/05/18 126/78  09/11/17 107/72  09/01/17 121/75   Wt Readings from Last 3 Encounters:  09/07/17 165 lb 12.6 oz (75.2 kg)  08/30/17 166 lb (75.3 kg)  08/12/17 163 lb (73.9 kg)   There is no height or weight on file to calculate BMI.   Physical Exam         Assessment & Plan:    See Problem List for Assessment and Plan of chronic medical problems.

## 2018-04-19 ENCOUNTER — Ambulatory Visit: Payer: Medicaid Other | Admitting: Internal Medicine

## 2020-01-07 ENCOUNTER — Other Ambulatory Visit: Payer: Self-pay

## 2020-01-07 ENCOUNTER — Ambulatory Visit: Payer: Medicare HMO | Attending: Anesthesiology

## 2020-01-07 DIAGNOSIS — M542 Cervicalgia: Secondary | ICD-10-CM | POA: Insufficient documentation

## 2020-01-07 DIAGNOSIS — R2689 Other abnormalities of gait and mobility: Secondary | ICD-10-CM | POA: Insufficient documentation

## 2020-01-07 DIAGNOSIS — G8929 Other chronic pain: Secondary | ICD-10-CM | POA: Diagnosis present

## 2020-01-07 DIAGNOSIS — M545 Low back pain: Secondary | ICD-10-CM | POA: Insufficient documentation

## 2020-01-07 DIAGNOSIS — M6281 Muscle weakness (generalized): Secondary | ICD-10-CM | POA: Diagnosis present

## 2020-01-08 NOTE — Therapy (Signed)
[place], [place], [address] Phone: [phone]   Fax:  [phone]  Physical Therapy Evaluation  Patient Details  Name: Gabriel Burns MRN: [id number] Date of Birth: 08/20/1971 Referring Provider (PT): Dorene Ar, MD   Encounter Date: 01/07/2020  PT End of Session - 01/08/20 1028    Visit Number  1    Number of Visits  13    Date for PT Re-Evaluation  02/23/20    Authorization Type  waiting on approval from [place] Time  1138    PT Stop Time  1228    PT Time Calculation (min)  50 min    Equipment Utilized During Treatment  Other (comment)   SPC   Activity Tolerance  Patient tolerated treatment well    Behavior During Therapy  [facility] for tasks assessed/performed       Past Medical History:  Diagnosis Date  . Chronic lower back pain   . COPD (chronic obstructive pulmonary disease) ([name])   . Depression   . History of kidney stones   . Migraine    "2-4 times/year" (08/30/2017)  . Multiple sclerosis ([name])   . 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 Spinal headache    woke up during procedure    Past Surgical History:  Procedure Laterality Date  . BACK SURGERY    . EPIDURAL BLOOD PATCH  ~ 11//2018   "for CSF leak"  . LITHOTRIPSY    . LUMBAR LAMINECTOMY  07/24/2017   "took piece off vertebrae; had a bulging herniated disc"  . LUMBAR WOUND DEBRIDEMENT N/A 09/07/2017   Procedure: Lumbar wound exploration with placement of lumbar drain;  Surgeon: Ditty, Kevan Ny, MD;  Location: [place];  Service: Neurosurgery;  Laterality: N/A;  . PLACEMENT OF LUMBAR DRAIN N/A 09/07/2017   Procedure: PLACEMENT OF LUMBAR DRAIN;  Surgeon: Ditty, Kevan Ny, MD;  Location: [place];  Service: Neurosurgery;  Laterality: N/A;  . REPAIR OF CEREBROSPINAL FLUID LEAK N/A 08/30/2017   Procedure: 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;  Surgeon: Ditty, Kevan Ny, MD;  Location: [place];   Service: Neurosurgery;  Laterality: N/A;  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  . WOUND EXPLORATION  08/30/2017   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/notes 08/30/2017    There were no vitals filed for this visit.   Subjective Assessment - 01/07/20 1159    Subjective  Pt reports low back and cervical pain for 4 years c 3 back surgeries 2 years ago for a bulging disc. Pt states his LBP is his greatest concern. Pt reports a significant physical/functional deficits of his R LE/UE due to MS. 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 Sitting brief periods helps to decrease his low back pain.    Limitations  Sitting;Lifting;Standing;Walking;House hold activities    How long can you sit comfortably?  15 mins    How long can you stand comfortably?  10 mins    How long can you walk comfortably?  1 min    Patient Stated Goals  To tolerate normal/every day activities with less pain.    Currently in Pain?  Yes    Pain Score  7     Pain Location  Back    Pain Orientation  Right    Pain Descriptors / Indicators  Aching;Throbbing    Pain Type  Chronic pain    Pain Radiating Towards  R lateral back more than L    Pain Onset  Other (comment)   4+ years   Pain Frequency  Constant    Aggravating Factors   Standing and walking    Pain Relieving Factors  Sitting for brief periods decreaseshis low back pain some.    Effect of Pain on Daily Activities  Completes with significant    pain         [facility] PT Assessment - 01/08/20 0001      Assessment   Medical Diagnosis  Spondylosis back and neck    Referring Provider (PT)  Peggye Pitt, MD    Hand Dominance  Right   but because of MS uses L mostly     Precautions   Precautions  None    Required Braces or Orthoses  Other Brace/Splint   AFO     Restrictions   Weight Bearing Restrictions  No       Balance Screen   Has the patient fallen in the past 6 months  Yes    How many times?  2x per week    Has the patient had a decrease in activity level because of a fear of falling?   Yes    Is the patient reluctant to leave their home because of a fear of falling?   Yes      Home Environment   Living Environment  Private residence    Living Arrangements  Children    Home Access  Stairs to enter    Entrance Stairs-Number of Steps  4    Entrance Stairs-Rails  Left    Home Layout  One level    Home Equipment  Walker - 2 wheels;Cane - single point      Prior Function   Level of Independence  Independent    Vocation  On disability    Leisure  draw      Cognition   Overall Cognitive Status  Within Functional Limits for tasks assessed      Sensation   Light Touch  Impaired by gross assessment   for L hand   Additional Comments  L UE is colder to touch thna the R      Coordination   Gross Motor Movements are Fluid and Coordinated  No   decreased for R UE and LE   Fine Motor Movements are Fluid and Coordinated  No   for the R hand     Posture/Postural Control   Posture/Postural Control  Postural limitations    Postural Limitations  Forward head      ROM / Strength   AROM / PROM / Strength  AROM;Strength      AROM   Overall AROM   Within functional limits for tasks performed    Overall AROM Comments  For the L UE and LE    AROM Assessment Site  Finger;Ankle;Hip;Lumbar    Right/Left Finger  Right    Right Composite Finger Extension  75%    Right Composite Finger Flexion  --   Full   Right/Left Hip  Right   Markedly decreased due to strength   Right/Left Ankle  Right    Right Ankle Dorsiflexion  --   markedly decreaseed   Right Ankle Plantar Flexion  --   markedly decreased   Right Ankle Inversion  --   markedly decreased   Right Ankle Eversion  --  markedly decreased   Lumbar Flexion  32 inches from floor    Lumbar Extension  Mod decreased    Lumbar - Right Side  Bend  52 inches from floor    Lumbar - Left Side Bend  52 inches from floor    Lumbar - Right Rotation  mod decreased    Lumbar - Left Rotation  mod decreased      Strength   Strength Assessment Site  Shoulder;Elbow;Wrist;Hand;Hip;Knee;Ankle    Right/Left Shoulder  Right    Right Shoulder Flexion  3-/5    Right Shoulder Extension  3-/5    Right Shoulder ABduction  3-/5    Right Shoulder Internal Rotation  3/5    Right Shoulder External Rotation  3-/5    Right Shoulder Horizontal ABduction  3-/5    Right Shoulder Horizontal ADduction  3/5    Right/Left Forearm  Right    Right Forearm Pronation  3/5    Right Forearm Supination  3/5    Right/Left Wrist  Right    Right Wrist Flexion  3/5    Right Wrist Extension  3/5    Right/Left hand  Right    Right Hand Gross Grasp  Impaired   2+/5 grip; 1+/5 opening   Right/Left Hip  Right    Right Hip Extension  2-/5    Right Hip External Rotation   1/5    Right Hip Internal Rotation  2-/5    Right Hip ABduction  1/5    Right Hip ADduction  2-/5    Right/Left Knee  Right    Right Knee Flexion  3/5    Right Knee Extension  3/5    Right/Left Ankle  Right    Right Ankle Dorsiflexion  0/5    Right Ankle Plantar Flexion  0/5      Palpation   SI assessment   Iliac crests, PSIS, ASIS were level iin standing      Ambulation/Gait   Ambulation Distance (Feet)  100 Feet    Assistive device  Rolling walker;Straight cane   recommended use   Gait Pattern  Trendelenburg;Right genu recurvatum;Right foot flat;Right steppage                Objective measurements completed on examination: See above findings.              PT Education - 01/08/20 1025    Education Details  Recommended use of RW or SPC for support and balance assist to decrease significant abnormal forces occuring with his back during ambualtion and to limit the incidences of falling    Person(s) Educated  Patient    Methods  Explanation;Demonstration;Tactile  cues;Verbal cues    Comprehension  Verbalized understanding;Returned demonstration;Verbal cues required;Tactile cues required       PT Short Term Goals - 01/08/20 1105      PT SHORT TERM GOAL #1   Title  Pt will be IND in HEP to improve LEand core balance, strength, and flexibility and to reduce pain.    Baseline  No program    Time  3    Period  Weeks    Status  New    Target Date  01/29/20      PT SHORT TERM GOAL #2   Title  Pt will todemonstrate appropriate use of a SPC and RW to decrease neck and back pain and reduce risk of falling    Baseline  Pt reports fall 2x per week    Time  3    Period  Weeks    Status  New    Target Date  01/29/20      PT SHORT TERM GOAL #3   Title  Have pt complete FOTO to determine limitation of function rating and then set LTG    Baseline  not taken    Time  2    Period  Weeks    Status  New    Target Date  01/22/20        PT Long Term Goals - 01/08/20 1113      PT LONG TERM GOAL #1   Title  Pt will demonstrate improved trunk ROM with improved ROM for 25%.    Baseline  Limited in all motions    Time  7    Period  Weeks    Status  New    Target Date  02/26/20      PT LONG TERM GOAL #2   Title  Pt will demonstrate decreased fall risk with proper use of an assist device and per report of a decreased frequency of falls to 2 per month    Baseline  2 falls per week    Time  7    Period  Weeks    Status  New    Target Date  02/26/20      PT LONG TERM GOAL #3   Title  Pt will tolerate walking 425ft c an appropriate assist device, improved gait pattern, and with a report of 5/10 level of back pain or less.    Baseline  7+/10    Time  7    Period  Weeks    Status  New    Target Date  02/26/20      PT LONG TERM GOAL #4   Title  Pt will report a neck and back pain range of 0-5 with ADLs.    Baseline  7+/10    Time  7    Period  Weeks    Status  New    Target Date  02/26/20      PT LONG TERM GOAL #5   Title  Pt will be IND in  final HEP to improve LE and core balance, strength, and flexibility and to reduce pain.    Baseline  no program    Time  7    Period  Weeks    Target Date  02/26/20             Plan - 01/08/20 1029    Clinical Impression Statement  Pt presents with a chronic Hx of cervical and low back pain. Pt reports his low back is giving him the most difficulty. Pt underwent 3 surgeries for a HNP 2 years ago. Pt has MS c significant asymmetrical weakness involving the R UE and LE and resulting in an abnormal gait pattern with significant forces of the back. Strength of the R hip is 1 to 2-/5 and he wears wears an AFO for support of R ankle. Pt has a RW and a SPC buts reports using then only occasionally. Education on proper use of a SPC was provided with pt returning demonstration. Pt was encouraged to use an assistive device at all time to reduce the significant abnormal forces his back is experiening. Pt will benefit from a course of PT to address LE and core strength and ROM, ambulation, and recurrent stresses impacting his back pain    Personal Factors and Comorbidities  Comorbidity 3+  Comorbidities  COPD, MS, depression, tobacco use disorder    Examination-Activity Limitations  Bend;Caring for Others;Stand;Stairs;Squat;Sleep;Sit;Locomotion Level;Transfers    Stability/Clinical Decision Making  Unstable/Unpredictable    Clinical Decision Making  High    Rehab Potential  Fair    PT Frequency  2x / week    PT Duration  6 weeks    PT Treatment/Interventions  ADLs/Self Care Home Management;Electrical Stimulation;Traction;Moist Heat;Iontophoresis 4mg /ml Dexamethasone;Gait training;Stair training;Functional mobility training;Therapeutic activities;Therapeutic exercise;Balance training;Manual techniques;Passive range of motion;Dry needling;Energy conservation;Joint Manipulations;Spinal Manipulations;Taping    PT Next Visit Plan  Initiate HEP to address LE and core strength and ROM/flexibility    PT Home  Exercise Plan  Consistent use of a SPC       Patient will benefit from skilled therapeutic intervention in order to improve the following deficits and impairments:  Abnormal gait, Decreased activity tolerance, Decreased balance, Decreased coordination, Decreased endurance, Decreased knowledge of precautions, Decreased mobility, Decreased strength, Decreased range of motion, Difficulty walking, Impaired flexibility, Impaired perceived functional ability, Improper body mechanics, Postural dysfunction, Pain  Visit Diagnosis: Chronic midline low back pain without sciatica - Plan: PT plan of care cert/re-cert  Cervicalgia - Plan: PT plan of care cert/re-cert  Other abnormalities of gait and mobility - Plan: PT plan of care cert/re-cert  Muscle weakness (generalized) - Plan: PT plan of care cert/re-cert     Problem List Patient Active Problem List   Diagnosis Date Noted  . Forearm laceration, right, initial encounter 04/18/2018  . Wound drainage 09/06/2017  . 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 Bulging lumbar disc 04/04/2017  . Acute right-sided thoracic back pain 04/04/2017  . Sciatica of right side 10/29/2016  . Alcohol abuse 10/08/2016  . Erectile dysfunction 02/09/2016  . Depression 01/08/2016  . MS (multiple sclerosis) ([facility]) 05/22/2015  . COPD (chronic obstructive pulmonary disease) ([facility]) 05/22/2015  . Tobacco use disorder 05/22/2015  . Chronic pain 05/22/2015    07/22/2015 MS, PT 01/08/20 3:13 PM  [facility] [address] [place], [place], [place] Phone: [phone]   Fax:  [phone]  Name: ZAIR BORAWSKI MRN: Binnie Rail Date of Birth: [birth year]

## 2020-01-21 ENCOUNTER — Other Ambulatory Visit: Payer: Self-pay

## 2020-01-21 ENCOUNTER — Ambulatory Visit: Payer: Medicare HMO | Attending: Anesthesiology

## 2020-01-21 DIAGNOSIS — M545 Low back pain: Secondary | ICD-10-CM | POA: Insufficient documentation

## 2020-01-21 DIAGNOSIS — G8929 Other chronic pain: Secondary | ICD-10-CM | POA: Insufficient documentation

## 2020-01-21 DIAGNOSIS — M6281 Muscle weakness (generalized): Secondary | ICD-10-CM

## 2020-01-21 DIAGNOSIS — R2689 Other abnormalities of gait and mobility: Secondary | ICD-10-CM | POA: Diagnosis present

## 2020-01-21 DIAGNOSIS — M542 Cervicalgia: Secondary | ICD-10-CM | POA: Insufficient documentation

## 2020-01-22 NOTE — Therapy (Signed)
[place], [place], [address] Phone: [phone]   Fax:  [phone]  Physical Therapy Treatment  Patient Details  Name: Gabriel Burns MRN: [id number] Date of Birth: [birth year] Referring Provider (PT): Dorene Ar, MD   Encounter Date: 01/21/2020  PT End of Session - 01/22/20 0856    Visit Number  2    Number of Visits  13    Date for PT Re-Evaluation  02/23/20    PT Start Time  5974    PT Stop Time  1220    PT Time Calculation (min)  49 min    Activity Tolerance  Patient tolerated treatment well    Behavior During Therapy  [facility] for tasks assessed/performed       Past Medical History:  Diagnosis Date  . Chronic lower back pain   . COPD (chronic obstructive pulmonary disease) ([place])   . Depression   . History of kidney stones   . Migraine    "2-4 times/year" (08/30/2017)  . Multiple sclerosis ([place])   . 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 Spinal headache    woke up during procedure    Past Surgical History:  Procedure Laterality Date  . BACK SURGERY    . EPIDURAL BLOOD PATCH  ~ 11//2018   "for CSF leak"  . LITHOTRIPSY    . LUMBAR LAMINECTOMY  07/24/2017   "took piece off vertebrae; had a bulging herniated disc"  . LUMBAR WOUND DEBRIDEMENT N/A 09/07/2017   Procedure: Lumbar wound exploration with placement of lumbar drain;  Surgeon: Ditty, Kevan Ny, MD;  Location: [place];  Service: Neurosurgery;  Laterality: N/A;  . PLACEMENT OF LUMBAR DRAIN N/A 09/07/2017   Procedure: PLACEMENT OF LUMBAR DRAIN;  Surgeon: Ditty, Kevan Ny, MD;  Location: [place];  Service: Neurosurgery;  Laterality: N/A;  . REPAIR OF CEREBROSPINAL FLUID LEAK N/A 08/30/2017   Procedure: 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;  Surgeon: Ditty, Kevan Ny, MD;  Location: [place];  Service: Neurosurgery;  Laterality: N/A;  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  . WOUND EXPLORATION   08/30/2017   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/notes 08/30/2017    There were no vitals filed for this visit.  Subjective Assessment - 01/21/20 1148    Subjective  Pt reports he has been using tha cane more frequently. He reports his low back pain is about the same, but he has not experienced any new falls.                       [facility] Adult PT Treatment/Exercise - 01/22/20 0001      Ambulation/Gait   Assistive device  Straight cane    Gait Pattern  Right genu recurvatum;Trendelenburg    Ambulation Surface  Level    Gait Comments  Gait deficits are present due to asymmetrical weakness affecting the R side, pelvic stabiity was improved c use of SPC. Pt used the [facility] c proper sequencing.        Exercises   Exercises  Lumbar;Knee/Hip      Lumbar Exercises: Stretches   Passive Hamstring Stretch  2 reps;20 seconds;Left    Single Knee to Chest Stretch  2 reps;Right;Left;20 seconds    Lower Trunk Rotation  --   15 reps each; 2 sec     Lumbar Exercises: Supine   Pelvic Tilt  15 reps    Pelvic Tilt Limitations  3 sec  Other Supine Lumbar Exercises  Mini crunch 15 reps      Lumbar Exercises: Prone   Other Prone Lumbar Exercises  [place]man, arm by side 15 reps             PT Education - 01/21/20 1154    Education Details  Proper use of a SPC in L hand for balance and better pelvis/back stability. HEP    Person(s) Educated  Patient    Methods  Explanation;Demonstration;Tactile cues;Verbal cues;Handout    Comprehension  Verbalized understanding;Returned demonstration;Verbal cues required;Tactile cues required;Need further instruction       PT Short Term Goals - 01/08/20 1105      PT SHORT TERM GOAL #1   Title  Pt will be IND in HEP to improve LEand core balance, strength, and flexibility and to reduce pain.    Baseline  No program    Time  3    Period  Weeks    Status  New    Target Date  01/29/20      PT SHORT TERM GOAL #2    Title  Pt will todemonstrate appropriate use of a SPC and RW to decrease neck and back pain and reduce risk of falling    Baseline  Pt reports fall 2x per week    Time  3    Period  Weeks    Status  New    Target Date  01/29/20      PT SHORT TERM GOAL #3   Title  Have pt complete FOTO to determine limitation of function rating and then set LTG    Baseline  not taken    Time  2    Period  Weeks    Status  New    Target Date  01/22/20        PT Long Term Goals - 01/08/20 1113      PT LONG TERM GOAL #1   Title  Pt will demonstrate improved trunk ROM with improved ROM for 25%.    Baseline  Limited in all motions    Time  7    Period  Weeks    Status  New    Target Date  02/26/20      PT LONG TERM GOAL #2   Title  Pt will demonstrate decreased fall risk with proper use of an assist device and per report of a decreased frequency of falls to 2 per month    Baseline  2 falls per week    Time  7    Period  Weeks    Status  New    Target Date  02/26/20      PT LONG TERM GOAL #3   Title  Pt will tolerate walking 435ft c an appropriate assist device, improved gait pattern, and with a report of 5/10 level of back pain or less.    Baseline  7+/10    Time  7    Period  Weeks    Status  New    Target Date  02/26/20      PT LONG TERM GOAL #4   Title  Pt will report a neck and back pain range of 0-5 with ADLs.    Baseline  7+/10    Time  7    Period  Weeks    Status  New    Target Date  02/26/20      PT LONG TERM GOAL #5   Title  Pt will be IND in  final HEP to improve LE and core balance, strength, and flexibility and to reduce pain.    Baseline  no program    Time  7    Period  Weeks    Target Date  02/26/20            Plan - 01/22/20 0857    Clinical Impression Statement  Completed gait training c SPC which improves pelvic/back stability. A ther ex for ROM, flexibility and core strenghtneing was started today.Pt demonstrated exs correctly.    PT  Treatment/Interventions  ADLs/Self Care Home Management;Electrical Stimulation;Traction;Moist Heat;Iontophoresis 4mg /ml Dexamethasone;Gait training;Stair training;Functional mobility training;Therapeutic activities;Therapeutic exercise;Balance training;Manual techniques;Passive range of motion;Dry needling;Energy conservation;Joint Manipulations;Spinal Manipulations;Taping    PT Next Visit Plan  Assess response to ther ex/ HEP    PT Home Exercise Plan  : Piriformis stretch, L hamstring stretch, Lower trunk rotation, SKTC, PPT, mini crunch.       Patient will benefit from skilled therapeutic intervention in order to improve the following deficits and impairments:  Abnormal gait, Decreased activity tolerance, Decreased balance, Decreased coordination, Decreased endurance, Decreased knowledge of precautions, Decreased mobility, Decreased strength, Decreased range of motion, Difficulty walking, Impaired flexibility, Impaired perceived functional ability, Improper body mechanics, Postural dysfunction, Pain  Visit Diagnosis: Chronic midline low back pain without sciatica  Cervicalgia  Other abnormalities of gait and mobility  Muscle weakness (generalized)     Problem List Patient Active Problem List   Diagnosis Date Noted  . Forearm laceration, right, initial encounter 04/18/2018  . Wound drainage 09/06/2017  . 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 Bulging lumbar disc 04/04/2017  . Acute right-sided thoracic back pain 04/04/2017  . Sciatica of right side 10/29/2016  . Alcohol abuse 10/08/2016  . Erectile dysfunction 02/09/2016  . Depression 01/08/2016  . MS (multiple sclerosis) ([facility]) 05/22/2015  . COPD (chronic obstructive pulmonary disease) ([facility]) 05/22/2015  . Tobacco use disorder 05/22/2015  . Chronic pain 05/22/2015    07/22/2015 MS, PT 01/22/20 9:11 AM  [facility] [address] [place], [place], [place] Phone:  [phone]   Fax:  [phone]  Name: Gabriel Burns MRN: Binnie Rail Date of Birth: [birth year]

## 2020-01-22 NOTE — Patient Instructions (Signed)
A ther ex for ROM, flexibility and core strenghtneing was started today.Pt demonstrated exs correctly. Piriformis stretch, L hamstring stretch, Lower trunk rotation, SKTC, PPT, mini crunch.

## 2020-01-24 ENCOUNTER — Ambulatory Visit: Payer: Medicare HMO

## 2020-01-24 ENCOUNTER — Telehealth: Payer: Self-pay

## 2020-01-25 ENCOUNTER — Telehealth: Payer: Self-pay

## 2020-01-25 NOTE — Telephone Encounter (Signed)
Pt reports he did not realize he had an appt today. Pt palna to attend his next appt.

## 2020-01-30 ENCOUNTER — Ambulatory Visit: Payer: Medicare HMO

## 2020-01-31 ENCOUNTER — Telehealth: Payer: Self-pay

## 2020-01-31 NOTE — Telephone Encounter (Signed)
Attempted to contact pt. Re: missed PT appt on 01/30/20. There was no answer to the call and voice mail had not been set up to be able leave a message.

## 2020-02-01 ENCOUNTER — Ambulatory Visit: Payer: Medicare HMO

## 2020-02-01 ENCOUNTER — Other Ambulatory Visit: Payer: Self-pay

## 2020-02-01 DIAGNOSIS — M6281 Muscle weakness (generalized): Secondary | ICD-10-CM

## 2020-02-01 DIAGNOSIS — M542 Cervicalgia: Secondary | ICD-10-CM

## 2020-02-01 DIAGNOSIS — G8929 Other chronic pain: Secondary | ICD-10-CM

## 2020-02-01 DIAGNOSIS — M545 Low back pain: Secondary | ICD-10-CM | POA: Diagnosis not present

## 2020-02-01 DIAGNOSIS — R2689 Other abnormalities of gait and mobility: Secondary | ICD-10-CM

## 2020-02-01 NOTE — Therapy (Signed)
[place], [place], [address] Phone: [phone]   Fax:  [phone]  Physical Therapy Treatment  Patient Details  Name: Gabriel Burns MRN: [id number] Date of Birth: [birth year] Referring Provider (PT): Dorene Ar, MD   Encounter Date: 02/01/2020  PT End of Session - 02/01/20 1257    Visit Number  3    Number of Visits  13    Date for PT Re-Evaluation  02/23/20    PT Start Time  1217    PT Stop Time  1302    PT Time Calculation (min)  45 min    Equipment Utilized During Treatment  Other (comment)   cane   Activity Tolerance  Patient tolerated treatment well    Behavior During Therapy  [facility] for tasks assessed/performed       Past Medical History:  Diagnosis Date  . Chronic lower back pain   . COPD (chronic obstructive pulmonary disease) ([place])   . Depression   . History of kidney stones   . Migraine    "2-4 times/year" (08/30/2017)  . Multiple sclerosis ([place])   . 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 Spinal headache    woke up during procedure    Past Surgical History:  Procedure Laterality Date  . BACK SURGERY    . EPIDURAL BLOOD PATCH  ~ 11//2018   "for CSF leak"  . LITHOTRIPSY    . LUMBAR LAMINECTOMY  07/24/2017   "took piece off vertebrae; had a bulging herniated disc"  . LUMBAR WOUND DEBRIDEMENT N/A 09/07/2017   Procedure: Lumbar wound exploration with placement of lumbar drain;  Surgeon: Ditty, Kevan Ny, MD;  Location: [facility];  Service: Neurosurgery;  Laterality: N/A;  . PLACEMENT OF LUMBAR DRAIN N/A 09/07/2017   Procedure: PLACEMENT OF LUMBAR DRAIN;  Surgeon: Ditty, Kevan Ny, MD;  Location: [employer] [facility];  Service: Neurosurgery;  Laterality: N/A;  . 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 WOUND EXPLORATION  08/30/2017   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/notes 08/30/2017    There were no vitals filed for this visit.  Subjective Assessment - 02/01/20 1248    Subjective  Pt reports the combination of receiving a hydrocodone/acetaminophen prescrption, using a cane and the HEP have been helpful with decreasing his LBP. Pt rates as a 3-4/10. pt states he is currently having a toothache which he needs to see a dentist about.    Currently in Pain?  Yes    Pain Score  4     Pain Location  Back    Pain Orientation  Lower;Right    Pain Descriptors / Indicators  Aching;Throbbing    Pain Type  Chronic pain    Pain Onset  More than a month ago    Pain Frequency  Constant    Aggravating Factors   standing and walking    Pain Relieving Factors  meds, use of cane, HEP    Effect of Pain on Daily Activities  Able to complete but with pain                       [facility] Adult PT Treatment/Exercise - 02/01/20 0001      Lumbar Exercises: Stretches   Passive Hamstring Stretch  2  reps;20 seconds;Left    Single Knee to Chest Stretch  2 reps;Right;Left;20 seconds    Lower Trunk Rotation  --   15 reps each; 2 sec     Lumbar Exercises: Supine   Pelvic Tilt  15 reps    Pelvic Tilt Limitations  3 sec; 2 sets    Other Supine Lumbar Exercises  Mini crunch 15 reps      Lumbar Exercises: Prone   Other Prone Lumbar Exercises  Superman, arm by side 15 reps             PT Education - 02/01/20 1256    Education Details  Review of HEP.       PT Short Term Goals - 01/08/20 1105      PT SHORT TERM GOAL #1   Title  Pt will be IND in HEP to improve LEand core balance, strength, and flexibility and to reduce pain.    Baseline  No program    Time  3    Period  Weeks    Status  New    Target Date  01/29/20      PT SHORT TERM GOAL #2   Title  Pt will todemonstrate appropriate use of a SPC and RW to  decrease neck and back pain and reduce risk of falling    Baseline  Pt reports fall 2x per week    Time  3    Period  Weeks    Status  New    Target Date  01/29/20      PT SHORT TERM GOAL #3   Title  Have pt complete FOTO to determine limitation of function rating and then set LTG    Baseline  not taken    Time  2    Period  Weeks    Status  New    Target Date  01/22/20        PT Long Term Goals - 01/08/20 1113      PT LONG TERM GOAL #1   Title  Pt will demonstrate improved trunk ROM with improved ROM for 25%.    Baseline  Limited in all motions    Time  7    Period  Weeks    Status  New    Target Date  02/26/20      PT LONG TERM GOAL #2   Title  Pt will demonstrate decreased fall risk with proper use of an assist device and per report of a decreased frequency of falls to 2 per month    Baseline  2 falls per week    Time  7    Period  Weeks    Status  New    Target Date  02/26/20      PT LONG TERM GOAL #3   Title  Pt will tolerate walking 480ft c an appropriate assist device, improved gait pattern, and with a report of 5/10 level of back pain or less.    Baseline  7+/10    Time  7    Period  Weeks    Status  New    Target Date  02/26/20      PT LONG TERM GOAL #4   Title  Pt will report a neck and back pain range of 0-5 with ADLs.    Baseline  7+/10    Time  7    Period  Weeks    Status  New    Target Date  02/26/20  PT LONG TERM GOAL #5   Title  Pt will be IND in final HEP to improve LE and core balance, strength, and flexibility and to reduce pain.    Baseline  no program    Time  7    Period  Weeks    Target Date  02/26/20            Plan - 02/01/20 1258    Clinical Impression Statement  Pt is now using a cane and reports completing his HEP. Pt also has a new prescription ofr hydrocodone. Pt's HEP was reviewed and pt completed the exs properly. With use of cane, the low back has decreased lateral forces occurring. Pt's subjective report  indicates improved LBP.    PT Treatment/Interventions  ADLs/Self Care Home Management;Electrical Stimulation;Traction;Moist Heat;Iontophoresis 4mg /ml Dexamethasone;Gait training;Stair training;Functional mobility training;Therapeutic activities;Therapeutic exercise;Balance training;Manual techniques;Passive range of motion;Dry needling;Energy conservation;Joint Manipulations;Spinal Manipulations;Taping    PT Next Visit Plan  Adjust pt's HEP for flexibility and core strengthening as indicated    PT Home Exercise Plan  No new exs were added today       Patient will benefit from skilled therapeutic intervention in order to improve the following deficits and impairments:  Abnormal gait, Decreased activity tolerance, Decreased balance, Decreased coordination, Decreased endurance, Decreased knowledge of precautions, Decreased mobility, Decreased strength, Decreased range of motion, Difficulty walking, Impaired flexibility, Impaired perceived functional ability, Improper body mechanics, Postural dysfunction, Pain  Visit Diagnosis: Chronic midline low back pain without sciatica  Cervicalgia  Other abnormalities of gait and mobility  Muscle weakness (generalized)     Problem List Patient Active Problem List   Diagnosis Date Noted  . Forearm laceration, right, initial encounter 04/18/2018  . Wound drainage 09/06/2017  . 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 Bulging lumbar disc 04/04/2017  . Acute right-sided thoracic back pain 04/04/2017  . Sciatica of right side 10/29/2016  . Alcohol abuse 10/08/2016  . Erectile dysfunction 02/09/2016  . Depression 01/08/2016  . MS (multiple sclerosis) ([facility]) 05/22/2015  . COPD (chronic obstructive pulmonary disease) ([facility]) 05/22/2015  . Tobacco use disorder 05/22/2015  . Chronic pain 05/22/2015    07/22/2015 MS, PT 02/01/20 1:10 PM  [facility] [address] [place], [place], [place] Phone: [phone]    Fax:  [phone]  Name: ZACCHAEUS HALM MRN: Binnie Rail Date of Birth: [birth year]

## 2020-02-04 ENCOUNTER — Ambulatory Visit: Payer: Medicare HMO

## 2020-02-04 ENCOUNTER — Other Ambulatory Visit: Payer: Self-pay

## 2020-02-04 DIAGNOSIS — M545 Low back pain, unspecified: Secondary | ICD-10-CM

## 2020-02-04 DIAGNOSIS — R2689 Other abnormalities of gait and mobility: Secondary | ICD-10-CM

## 2020-02-04 DIAGNOSIS — M542 Cervicalgia: Secondary | ICD-10-CM

## 2020-02-04 DIAGNOSIS — M6281 Muscle weakness (generalized): Secondary | ICD-10-CM

## 2020-02-04 DIAGNOSIS — G8929 Other chronic pain: Secondary | ICD-10-CM

## 2020-02-05 NOTE — Patient Instructions (Signed)
Diagonal mini crunch and child's pose stretch were added to HEP.

## 2020-02-05 NOTE — Therapy (Signed)
[facility] [address] [place], [place], [address] Phone: [phone]   Fax:  [phone]  Physical Therapy Treatment  Patient Details  Name: Gabriel Burns MRN: [id number] Date of Birth: 10-18-1970 Referring Provider (PT): Peggye Pitt, MD   Encounter Date: 02/04/2020  PT End of Session - 02/04/20 1533    Visit Number  4    Number of Visits  13    Date for PT Re-Evaluation  02/23/20    PT Start Time  1132    PT Stop Time  1212    PT Time Calculation (min)  40 min    Activity Tolerance  Patient tolerated treatment well    Behavior During Therapy  [facility] for tasks assessed/performed       Past Medical History:  Diagnosis Date  . Chronic lower back pain   . COPD (chronic obstructive pulmonary disease) ([facility])   . Depression   . History of kidney stones   . Migraine    "2-4 times/year" (08/30/2017)  . Multiple sclerosis ([facility])   . 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 Spinal headache    woke up during procedure    Past Surgical History:  Procedure Laterality Date  . BACK SURGERY    . EPIDURAL BLOOD PATCH  ~ 11//2018   "for CSF leak"  . LITHOTRIPSY    . LUMBAR LAMINECTOMY  07/24/2017   "took piece off vertebrae; had a bulging herniated disc"  . LUMBAR WOUND DEBRIDEMENT N/A 09/07/2017   Procedure: Lumbar wound exploration with placement of lumbar drain;  Surgeon: Ditty, Loura Halt, MD;  Location: [facility] OR;  Service: Neurosurgery;  Laterality: N/A;  . PLACEMENT OF LUMBAR DRAIN N/A 09/07/2017   Procedure: PLACEMENT OF LUMBAR DRAIN;  Surgeon: Ditty, Loura Halt, MD;  Location: [facility] OR;  Service: Neurosurgery;  Laterality: N/A;  . 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 WOUND EXPLORATION   08/30/2017   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/notes 08/30/2017    There were no vitals filed for this visit.  Subjective Assessment - 02/04/20 1136    Subjective  Pt reports a  R low back pain for a 6/10. He states he has not been able to address his tooth pain.    Currently in Pain?  Yes    Pain Score  6     Pain Location  Back    Pain Orientation  Right;Posterior    Pain Descriptors / Indicators  Aching;Throbbing    Pain Type  Chronic pain    Pain Radiating Towards  R back    Pain Onset  More than a month ago    Pain Frequency  Constant    Aggravating Factors   standing and walking    Pain Relieving Factors  meds, use of cane    Effect of Pain on Daily Activities  Able to complete, but in pain                       [facility] Adult PT Treatment/Exercise - 02/05/20 0001      Ambulation/Gait   Assistive device  Straight cane    Gait Pattern  Right genu recurvatum;Trendelenburg      Exercises   Exercises  Lumbar;Knee/Hip      Lumbar  Exercises: Stretches   Single Knee to Chest Stretch  2 reps;Right;Left;20 seconds    Lower Trunk Rotation  --   10 reps each; 2 sec   Piriformis Stretch  2 reps;20 seconds;Right;Left      Lumbar Exercises: Aerobic   Nustep  7 mins; L1; arms and legs      Lumbar Exercises: Supine   Pelvic Tilt  10 reps    Pelvic Tilt Limitations  3 sec; 2 sets    Other Supine Lumbar Exercises  Mini crunch; mini cruch diagonals 10 reps,    Other Supine Lumbar Exercises  Childs pose c L Tball; diagonal; 10x eack ex      Lumbar Exercises: Prone   Other Prone Lumbar Exercises  Superman, arm by side 10x2 reps             PT Education - 02/04/20 1531    Education Details  Discussed support/bracing for his R knee for better stability and to reduce low back strain.    Person(s) Educated  Patient    Methods  Explanation    Comprehension  Verbalized understanding       PT Short Term Goals - 01/08/20 1105      PT  SHORT TERM GOAL #1   Title  Pt will be IND in HEP to improve LEand core balance, strength, and flexibility and to reduce pain.    Baseline  No program    Time  3    Period  Weeks    Status  New    Target Date  01/29/20      PT SHORT TERM GOAL #2   Title  Pt will todemonstrate appropriate use of a SPC and RW to decrease neck and back pain and reduce risk of falling    Baseline  Pt reports fall 2x per week    Time  3    Period  Weeks    Status  New    Target Date  01/29/20      PT SHORT TERM GOAL #3   Title  Have pt complete FOTO to determine limitation of function rating and then set LTG    Baseline  not taken    Time  2    Period  Weeks    Status  New    Target Date  01/22/20        PT Long Term Goals - 01/08/20 1113      PT LONG TERM GOAL #1   Title  Pt will demonstrate improved trunk ROM with improved ROM for 25%.    Baseline  Limited in all motions    Time  7    Period  Weeks    Status  New    Target Date  02/26/20      PT LONG TERM GOAL #2   Title  Pt will demonstrate decreased fall risk with proper use of an assist device and per report of a decreased frequency of falls to 2 per month    Baseline  2 falls per week    Time  7    Period  Weeks    Status  New    Target Date  02/26/20      PT LONG TERM GOAL #3   Title  Pt will tolerate walking 485ft c an appropriate assist device, improved gait pattern, and with a report of 5/10 level of back pain or less.    Baseline  7+/10    Time  7  Period  Weeks    Status  New    Target Date  02/26/20      PT LONG TERM GOAL #4   Title  Pt will report a neck and back pain range of 0-5 with ADLs.    Baseline  7+/10    Time  7    Period  Weeks    Status  New    Target Date  02/26/20      PT LONG TERM GOAL #5   Title  Pt will be IND in final HEP to improve LE and core balance, strength, and flexibility and to reduce pain.    Baseline  no program    Time  7    Period  Weeks    Target Date  02/26/20             Plan - 02/05/20 0731    Clinical Impression Statement  PT continues to work on core and LE flexibility and strength. Initiated support/bracing for the R knee which goes into recurvatum, with a trendelenberg gai cause repetitive abnormal forces across back/trunk.    Examination-Activity Limitations  Bend;Caring for Others;Stand;Stairs;Squat;Sleep;Sit;Locomotion Level;Transfers    PT Treatment/Interventions  ADLs/Self Care Home Management;Electrical Stimulation;Traction;Moist Heat;Iontophoresis 4mg /ml Dexamethasone;Gait training;Stair training;Functional mobility training;Therapeutic activities;Therapeutic exercise;Balance training;Manual techniques;Passive range of motion;Dry needling;Energy conservation;Joint Manipulations;Spinal Manipulations;Taping    PT Next Visit Plan  Continue discussion with recommendation for orthotist referral for bracing of the L knee. Assess status of neck pain.    PT Home Exercise Plan  . Diagonal mini crunch and child's pose stretch were added to HEP.    Recommended Other Services  Orthotist referral       Patient will benefit from skilled therapeutic intervention in order to improve the following deficits and impairments:  Abnormal gait, Decreased activity tolerance, Decreased balance, Decreased coordination, Decreased endurance, Decreased knowledge of precautions, Decreased mobility, Decreased strength, Decreased range of motion, Difficulty walking, Impaired flexibility, Impaired perceived functional ability, Improper body mechanics, Postural dysfunction, Pain  Visit Diagnosis: Chronic midline low back pain without sciatica  Cervicalgia  Other abnormalities of gait and mobility  Muscle weakness (generalized)     Problem List Patient Active Problem List   Diagnosis Date Noted  . Forearm laceration, right, initial encounter 04/18/2018  . Wound drainage 09/06/2017  . 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 Bulging lumbar disc 04/04/2017  .  Acute right-sided thoracic back pain 04/04/2017  . Sciatica of right side 10/29/2016  . Alcohol abuse 10/08/2016  . Erectile dysfunction 02/09/2016  . Depression 01/08/2016  . MS (multiple sclerosis) ([facility]) 05/22/2015  . COPD (chronic obstructive pulmonary disease) ([facility]) 05/22/2015  . Tobacco use disorder 05/22/2015  . Chronic pain 05/22/2015    07/22/2015 MS, PT 02/05/20 7:49 AM  [facility] [address] [place], [place], [place] Phone: [phone]   Fax:  [phone]  Name: Gabriel Burns MRN: Binnie Rail Date of Birth: Oct 29, 1970

## 2020-02-06 ENCOUNTER — Ambulatory Visit: Payer: Medicare HMO

## 2020-02-06 ENCOUNTER — Other Ambulatory Visit: Payer: Self-pay

## 2020-02-06 DIAGNOSIS — M545 Low back pain: Secondary | ICD-10-CM | POA: Diagnosis not present

## 2020-02-06 DIAGNOSIS — M6281 Muscle weakness (generalized): Secondary | ICD-10-CM

## 2020-02-06 DIAGNOSIS — R2689 Other abnormalities of gait and mobility: Secondary | ICD-10-CM

## 2020-02-06 DIAGNOSIS — M542 Cervicalgia: Secondary | ICD-10-CM

## 2020-02-06 DIAGNOSIS — G8929 Other chronic pain: Secondary | ICD-10-CM

## 2020-02-07 NOTE — Therapy (Addendum)
[address] [place], [place], [address] Phone: [phone]   Fax:  [phone]  Physical Therapy Treatment/DC Summary  Patient Details  Name: Gabriel Burns MRN: [id number] Date of Birth: July 17, 1971 Referring Provider (PT): Dorene Ar, MD   Encounter Date: 02/06/2020  PT End of Session - 02/07/20 0554    Visit Number  5    Number of Visits  13    Date for PT Re-Evaluation  02/23/20    PT Start Time  1138    PT Stop Time  1230    PT Time Calculation (min)  52 min    Activity Tolerance  Patient tolerated treatment well    Behavior During Therapy  [facility] for tasks assessed/performed       Past Medical History:  Diagnosis Date  . Chronic lower back pain   . COPD (chronic obstructive pulmonary disease) ([place])   . Depression   . History of kidney stones   . Migraine    "2-4 times/year" (08/30/2017)  . Multiple sclerosis ([place])   . 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 Spinal headache    woke up during procedure    Past Surgical History:  Procedure Laterality Date  . BACK SURGERY    . EPIDURAL BLOOD PATCH  ~ 11//2018   "for CSF leak"  . LITHOTRIPSY    . LUMBAR LAMINECTOMY  07/24/2017   "took piece off vertebrae; had a bulging herniated disc"  . LUMBAR WOUND DEBRIDEMENT N/A 09/07/2017   Procedure: Lumbar wound exploration with placement of lumbar drain;  Surgeon: Ditty, Kevan Ny, MD;  Location: [place];  Service: Neurosurgery;  Laterality: N/A;  . PLACEMENT OF LUMBAR DRAIN N/A 09/07/2017   Procedure: PLACEMENT OF LUMBAR DRAIN;  Surgeon: Ditty, Kevan Ny, MD;  Location: [place];  Service: Neurosurgery;  Laterality: N/A;  . 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 WOUND  EXPLORATION  08/30/2017   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/notes 08/30/2017    There were no vitals filed for this visit.  Subjective Assessment - 02/07/20 0549    Subjective  Pt reports his low back pain is about the same and his tooth issue has not yet been addressed. pt rates his neck pain a 3/10.         [facility] PT Assessment - 02/07/20 0001      ROM / Strength   AROM / PROM / Strength  AROM      AROM   AROM Assessment Site  Cervical    Cervical Extension  Full    Cervical - Right Side Bend  25    Cervical - Left Side Bend  30    Cervical - Right Rotation  44    Cervical - Left Rotation  70                   [facility] Adult PT Treatment/Exercise - 02/07/20 0001      Ambulation/Gait   Assistive device  Straight cane    Gait Pattern  Right genu recurvatum;Trendelenburg      Exercises   Exercises  Neck;Lumbar;Knee/Hip      Neck Exercises: Seated   Neck Retraction  10 reps;3 secs    Neck Retraction Limitations  10 reps c R SB  Cervical Rotation  Both;10 reps    Other Seated Exercise  Scapular retractions 10x      Manual Therapy   Manual Therapy  Soft tissue mobilization;Manual Traction    Soft tissue mobilization  STM to sub-occipital, levator scapulae, and upper trap    Manual Traction  Axial distraction c grade 3 mobs             PT Education - 02/07/20 0553    Education Details  Proper sitting posture to reduce cervical strain; Cervical HEP. Recommendation for orthotist referral.    Person(s) Educated  Patient    Methods  Explanation;Demonstration;Tactile cues;Verbal cues    Comprehension  Verbalized understanding;Returned demonstration;Verbal cues required;Tactile cues required;Need further instruction       PT Short Term Goals - 01/08/20 1105      PT SHORT TERM GOAL #1   Title  Pt will be IND in HEP to improve LEand core balance, strength, and flexibility and to reduce pain.    Baseline  No program    Time  3     Period  Weeks    Status  New    Target Date  01/29/20      PT SHORT TERM GOAL #2   Title  Pt will todemonstrate appropriate use of a SPC and RW to decrease neck and back pain and reduce risk of falling    Baseline  Pt reports fall 2x per week    Time  3    Period  Weeks    Status  New    Target Date  01/29/20      PT SHORT TERM GOAL #3   Title  Have pt complete FOTO to determine limitation of function rating and then set LTG    Baseline  not taken    Time  2    Period  Weeks    Status  New    Target Date  01/22/20        PT Long Term Goals - 01/08/20 1113      PT LONG TERM GOAL #1   Title  Pt will demonstrate improved trunk ROM with improved ROM for 25%.    Baseline  Limited in all motions    Time  7    Period  Weeks    Status  New    Target Date  02/26/20      PT LONG TERM GOAL #2   Title  Pt will demonstrate decreased fall risk with proper use of an assist device and per report of a decreased frequency of falls to 2 per month    Baseline  2 falls per week    Time  7    Period  Weeks    Status  New    Target Date  02/26/20      PT LONG TERM GOAL #3   Title  Pt will tolerate walking 49f c an appropriate assist device, improved gait pattern, and with a report of 5/10 level of back pain or less.    Baseline  7+/10    Time  7    Period  Weeks    Status  New    Target Date  02/26/20      PT LONG TERM GOAL #4   Title  Pt will report a neck and back pain range of 0-5 with ADLs.    Baseline  7+/10    Time  7    Period  Weeks    Status  New  Target Date  02/26/20      PT LONG TERM GOAL #5   Title  Pt will be IND in final HEP to improve LE and core balance, strength, and flexibility and to reduce pain.    Baseline  no program    Time  7    Period  Weeks    Target Date  02/26/20            Plan - 02/07/20 0556    Clinical Impression Statement  Cervical assessment found cervical pain L greater than R, 3/10/ with limited R SB and rotation AROM. Pt  reported l cervical tight with these movements. Foward head posture and postural stresses from R body weakness, MS, are factors in pt's cervical issue. Ther ex was provided to address posture and ROM, and STM and manual axial cervical traction. Discussed recommendation for orthotist referral for LE bracing.Pt is open to a referral.    PT Treatment/Interventions  ADLs/Self Care Home Management;Electrical Stimulation;Traction;Moist Heat;Iontophoresis 38m/ml Dexamethasone;Gait training;Stair training;Functional mobility training;Therapeutic activities;Therapeutic exercise;Balance training;Manual techniques;Passive range of motion;Dry needling;Energy conservation;Joint Manipulations;Spinal Manipulations;Taping    PT Next Visit Plan  Assess response to cervical exs and tratment    PT Home Exercise Plan  [id number] Cervical ex: Cervical retractions, cervical retraction c R SB, cervical rotation L and R, scapular retractions.       Patient will benefit from skilled therapeutic intervention in order to improve the following deficits and impairments:  Abnormal gait, Decreased activity tolerance, Decreased balance, Decreased coordination, Decreased endurance, Decreased knowledge of precautions, Decreased mobility, Decreased strength, Decreased range of motion, Difficulty walking, Impaired flexibility, Impaired perceived functional ability, Improper body mechanics, Postural dysfunction, Pain  Visit Diagnosis: Chronic midline low back pain without sciatica  Cervicalgia  Other abnormalities of gait and mobility  Muscle weakness (generalized)     Problem List Patient Active Problem List   Diagnosis Date Noted  . Forearm laceration, right, initial encounter 04/18/2018  . Wound drainage 09/06/2017  . 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 Bulging lumbar disc 04/04/2017  . Acute right-sided thoracic back pain 04/04/2017  . Sciatica of right side 10/29/2016  . Alcohol abuse 10/08/2016  . Erectile dysfunction  02/09/2016  . Depression 01/08/2016  . MS (multiple sclerosis) ([place] 05/22/2015  . COPD (chronic obstructive pulmonary disease) ([place] 05/22/2015  . Tobacco use disorder 05/22/2015  . Chronic pain 05/22/2015    AGar PontoMS, PT 02/07/20 6:15 AM   PHYSICAL THERAPY DISCHARGE SUMMARY  Visits from Start of Care: 5   Current functional level related to goals / functional outcomes: Self DC- reason unknown   Remaining deficits: Self DC- reason unknown   Education / Equipment: HEP provided Plan:                                                    Patient goals were not met. Patient is being discharged due to not returning since the last visit.  ?????       [place] [address]Phone: [phone]  Fax:  [phone] Name: Gabriel STRAUCHMRN: [id number] of Birth: 205-Feb-1972

## 2020-02-07 NOTE — Patient Instructions (Signed)
[id number]: Cervical ex: Cervical retractions, cervical retraction c R SB, cervical rotation L and R, scapular retractions.

## 2020-02-07 NOTE — Telephone Encounter (Signed)
Note in error.

## 2020-02-07 NOTE — Telephone Encounter (Signed)
Note in n error

## 2020-10-23 ENCOUNTER — Encounter: Payer: Self-pay | Admitting: Neurology

## 2020-10-23 ENCOUNTER — Ambulatory Visit: Payer: Medicare HMO | Admitting: Neurology

## 2021-01-08 ENCOUNTER — Other Ambulatory Visit: Payer: Self-pay | Admitting: Family

## 2021-01-08 DIAGNOSIS — G35 Multiple sclerosis: Secondary | ICD-10-CM

## 2021-01-19 ENCOUNTER — Encounter: Payer: Self-pay | Admitting: Neurology

## 2021-01-19 ENCOUNTER — Ambulatory Visit: Payer: Medicare HMO | Admitting: Neurology

## 2021-02-11 ENCOUNTER — Ambulatory Visit: Payer: Medicare HMO | Admitting: Neurology

## 2021-02-11 ENCOUNTER — Encounter: Payer: Self-pay | Admitting: Neurology

## 2021-02-28 ENCOUNTER — Other Ambulatory Visit: Payer: Self-pay

## 2021-02-28 ENCOUNTER — Emergency Department (HOSPITAL_COMMUNITY): Payer: Medicare HMO

## 2021-02-28 ENCOUNTER — Emergency Department (HOSPITAL_COMMUNITY)
Admission: EM | Admit: 2021-02-28 | Discharge: 2021-02-28 | Disposition: A | Discharge status: Home / Self Care | Payer: Medicare HMO | Attending: Emergency Medicine | Admitting: Emergency Medicine

## 2021-02-28 ENCOUNTER — Encounter (HOSPITAL_COMMUNITY): Payer: Self-pay | Admitting: *Deleted

## 2021-02-28 DIAGNOSIS — S61217A Laceration without foreign body of left little finger without damage to nail, initial encounter: Secondary | ICD-10-CM

## 2021-02-28 DIAGNOSIS — J449 Chronic obstructive pulmonary disease, unspecified: Secondary | ICD-10-CM | POA: Insufficient documentation

## 2021-02-28 DIAGNOSIS — Y288XXA Contact with other sharp object, undetermined intent, initial encounter: Secondary | ICD-10-CM | POA: Diagnosis not present

## 2021-02-28 DIAGNOSIS — F1210 Cannabis abuse, uncomplicated: Secondary | ICD-10-CM | POA: Diagnosis not present

## 2021-02-28 DIAGNOSIS — Z79899 Other long term (current) drug therapy: Secondary | ICD-10-CM | POA: Insufficient documentation

## 2021-02-28 DIAGNOSIS — F17210 Nicotine dependence, cigarettes, uncomplicated: Secondary | ICD-10-CM | POA: Diagnosis not present

## 2021-02-28 DIAGNOSIS — Z23 Encounter for immunization: Secondary | ICD-10-CM | POA: Diagnosis not present

## 2021-02-28 MED ORDER — TETANUS-DIPHTH-ACELL PERTUSSIS 5-2.5-18.5 LF-MCG/0.5 IM SUSY
0.5000 mL | PREFILLED_SYRINGE | Freq: Once | INTRAMUSCULAR | Status: AC
  Administered 2021-02-28: 0.5 mL via INTRAMUSCULAR
  Filled 2021-02-28: qty 0.5

## 2021-02-28 MED ORDER — CEPHALEXIN 500 MG PO CAPS: 500.0000 mg | ORAL_CAPSULE | Freq: Four times a day (QID) | ORAL | 0 refills | Status: DC

## 2021-02-28 MED ORDER — BUPIVACAINE HCL (PF) 0.5 % IJ SOLN
10.0000 mL | Freq: Once | INTRAMUSCULAR | Status: AC
  Administered 2021-02-28: 10 mL
  Filled 2021-02-28: qty 10

## 2021-02-28 NOTE — ED Triage Notes (Signed)
Pt states he cut off a part of his L pinky yesterday and he put something on it and stuck it back on with a bandaid.  Can't remember the last time he had a tetanus shot.

## 2021-02-28 NOTE — ED Provider Notes (Signed)
[facility] EMERGENCY DEPARTMENT Provider Note   CSN: [id number] Arrival date & time: 02/28/21  1215     History Chief Complaint  Patient presents with  . Finger Injury    Gabriel Burns is a 50 y.o. male.  HPI      Gabriel Burns is a 50 y.o. male, with a history of multiple sclerosis, COPD, presenting to the ED with left small finger injury that occurred yesterday. Patient states he was cleaning out his truck bed, ran his hand along one of the pieces of metal attached to his truck bed, and this cut his finger. He does not know when his tetanus vaccination was last updated. States the pain is moderate to severe, throbbing, nonradiating. Denies numbness, weakness, other injuries.  Past Medical History:  Diagnosis Date  . Chronic lower back pain   . COPD (chronic obstructive pulmonary disease) ([facility])   . Depression   . History of kidney stones   . Migraine    "2-4 times/year" (08/30/2017)  . Multiple sclerosis ([facility])   . 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 Spinal headache    woke up during procedure    Patient Active Problem List   Diagnosis Date Noted  . Forearm laceration, right, initial encounter 04/18/2018  . Wound drainage 09/06/2017  . 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 Bulging lumbar disc 04/04/2017  . Acute right-sided thoracic back pain 04/04/2017  . Sciatica of right side 10/29/2016  . Alcohol abuse 10/08/2016  . Erectile dysfunction 02/09/2016  . Depression 01/08/2016  . MS (multiple sclerosis) ([facility]) 05/22/2015  . COPD (chronic obstructive pulmonary disease) ([facility]) 05/22/2015  . Tobacco use disorder 05/22/2015  . Chronic pain 05/22/2015    Past Surgical History:  Procedure Laterality Date  . BACK SURGERY    . EPIDURAL BLOOD PATCH  ~ 11//2018   "for CSF leak"  . LITHOTRIPSY    . LUMBAR LAMINECTOMY  07/24/2017   "took piece off vertebrae; had a bulging herniated disc"  . LUMBAR WOUND DEBRIDEMENT N/A 09/07/2017   Procedure: Lumbar  wound exploration with placement of lumbar drain;  Surgeon: Ditty, Loura Halt, MD;  Location: [facility] OR;  Service: Neurosurgery;  Laterality: N/A;  . PLACEMENT OF LUMBAR DRAIN N/A 09/07/2017   Procedure: PLACEMENT OF LUMBAR DRAIN;  Surgeon: Ditty, Loura Halt, MD;  Location: [facility] OR;  Service: Neurosurgery;  Laterality: N/A;  . REPAIR OF CEREBROSPINAL FLUID LEAK N/A 08/30/2017   Procedure: 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;  Surgeon: Ditty, Loura Halt, MD;  Location: [facility] OR;  Service: Neurosurgery;  Laterality: N/A;  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  . WOUND EXPLORATION  08/30/2017   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/notes 08/30/2017       Family History  Problem Relation Age of Onset  . Hypertension Father   . Alcohol abuse Paternal Grandfather     Social History   Tobacco Use  . Smoking status: Current Every Day Smoker    Packs/day: 1.00    Years: 30.00    Pack years: 30.00    Types: Cigars, Cigarettes  . Smokeless tobacco: Never Used  Vaping Use  . Vaping Use: Never used  Substance Use Topics  . Alcohol use: No  . Drug use: Yes    Types: Marijuana    Comment: 08/30/2017 "couple times/day"    Home Medications Prior to Admission medications   Medication Sig Start Date End Date Taking? Authorizing Provider  amphetamine-dextroamphetamine (ADDERALL) 20 MG tablet Take  1 tablet (20 mg total) by mouth daily. Patient taking differently: Take 20 mg by mouth 2 (two) times daily. 08/12/17  Yes Nche, Bonna Gainsharlotte Lum, NP  buPROPion (WELLBUTRIN SR) 150 MG 12 hr tablet Take 150 mg by mouth 2 (two) times daily. 01/06/21  Yes [provider]  cephALEXin (KEFLEX) 500 MG capsule Take 1 capsule (500 mg total) by mouth 4 (four) times daily. 02/28/21  Yes Keyatta Tolles C, PA-C  finasteride (PROSCAR) 5 MG tablet Take 5 mg by mouth daily. 01/23/21  Yes [provider]  gabapentin (NEURONTIN) 300 MG  capsule Take 1 capsule (300 mg total) by mouth at bedtime. Patient taking differently: Take 600 mg by mouth 2 (two) times daily. 08/12/17  Yes Nche, Bonna Gainsharlotte Lum, NP  HYDROcodone-acetaminophen (NORCO) 10-325 MG tablet Take 1 tablet by mouth 4 (four) times daily as needed for pain. 02/23/21  Yes [provider]  Ofatumumab (KESIMPTA) 20 MG/0.4ML SOAJ Inject 20 mg into the skin every 28 (twenty-eight) days. 04/08/20  Yes [provider]  sildenafil (REVATIO) 20 MG tablet Take 1-2tabs prior to activity as needed. Patient taking differently: Take 20-40 mg by mouth daily as needed (Prior to sexual activity). 08/12/17  Yes Nche, Bonna Gainsharlotte Lum, NP  venlafaxine XR (EFFEXOR-XR) 150 MG 24 hr capsule Take 150 mg by mouth daily. 12/26/20  Yes [provider]  oxybutynin (DITROPAN-XL) 5 MG 24 hr tablet Take 1 tablet (5 mg total) by mouth at bedtime. Patient not taking: Reported on 02/28/2021 08/12/17   Nche, Bonna Gainsharlotte Lum, NP  oxyCODONE (OXY IR/ROXICODONE) 5 MG immediate release tablet Take 1-2 tablets (5-10 mg total) by mouth every 4 (four) hours as needed for moderate pain. Patient not taking: No sig reported 09/11/17   Tressie StalkerJenkins, Jeffrey, MD  Oxycodone HCl 10 MG TABS Take 1 tablet (10 mg total) by mouth 3 (three) times daily. Patient not taking: No sig reported 08/12/17   Nche, Bonna Gainsharlotte Lum, NP    Allergies    Patient has no known allergies.  Review of Systems   Review of Systems  Skin: Positive for wound.  Neurological: Negative for weakness and numbness.    Physical Exam Updated Vital Signs BP (!) 160/107 (BP Location: Left Arm)   Pulse (!) 112   Temp 98 F (36.7 C) (Oral)   Resp 15   Ht 5\' 10"  (1.778 m)   Wt 77.1 kg   SpO2 99%   BMI 24.39 kg/m   Physical Exam Vitals and nursing note reviewed.  Constitutional:      General: He is not in acute distress.    Appearance: He is well-developed. He is not diaphoretic.  HENT:     Head: Normocephalic and atraumatic.   Eyes:     Conjunctiva/sclera: Conjunctivae normal.  Cardiovascular:     Rate and Rhythm: Normal rate and regular rhythm.  Pulmonary:     Effort: Pulmonary effort is normal.  Musculoskeletal:     Cervical back: Neck supple.     Comments: Approximately 2 cm laceration to the left palmar small finger, as shown in the photos.  There is some underlying swelling.  Does not involve the nail. Full range of motion with flexion and extension in the DIP, PIP, and MCP joints of the finger.  Skin:    General: Skin is warm and dry.     Capillary Refill: Capillary refill takes less than 2 seconds.     Coloration: Skin is not pale.  Neurological:     Mental Status: He  is alert.     Comments: Sensation to light touch grossly intact in the left small finger. Strength with flexion and extension intact against resistance.  Psychiatric:        Behavior: Behavior normal.                 ED Results / Procedures / Treatments   Labs (all labs ordered are listed, but only abnormal results are displayed) Labs Reviewed - No data to display  EKG None  Radiology DG Finger Little Left  Result Date: 02/28/2021 CLINICAL DATA:  Finger injury. EXAM: LEFT LITTLE FINGER 2+V COMPARISON:  None. FINDINGS: Soft tissue avulsion injury noted in the distal aspect of the fifth digit. No underlying fracture is present. No foreign body is present. IMPRESSION: Soft tissue avulsion injury without underlying fracture or foreign body. Electronically Signed   By: Marin Roberts M.D.   On: 02/28/2021 13:01    Procedures .Nerve Block  Date/Time: 02/28/2021 2:15 PM Performed by: Anselm Pancoast, PA-C Authorized by: Anselm Pancoast, PA-C   Consent:    Consent obtained:  Verbal   Consent given by:  Patient   Risks, benefits, and alternatives were discussed: yes     Risks discussed:  Infection, swelling, nerve damage, pain, unsuccessful block and bleeding Universal protocol:    Procedure explained and questions  answered to patient or proxy's satisfaction: yes     Patient identity confirmed:  Verbally with patient and provided demographic data Indications:    Indications:  Procedural anesthesia and pain relief Location:    Body area:  Upper extremity   Upper extremity nerve blocked: Digital, left small finger.   Laterality:  Left Pre-procedure details:    Skin preparation:  Alcohol Procedure details:    Block needle gauge:  25 G   Anesthetic injected:  Bupivacaine 0.5% w/o epi   Steroid injected:  None   Additive injected:  None   Injection procedure:  Anatomic landmarks identified, anatomic landmarks palpated, incremental injection, introduced needle and negative aspiration for blood Post-procedure details:    Outcome:  Anesthesia achieved   Procedure completion:  Tolerated well, no immediate complications .Marland KitchenLaceration Repair  Date/Time: 02/28/2021 2:30 PM Performed by: Anselm Pancoast, PA-C Authorized by: Anselm Pancoast, PA-C   Consent:    Consent obtained:  Verbal   Consent given by:  Patient   Risks, benefits, and alternatives were discussed: yes     Risks discussed:  Infection, pain, poor cosmetic result, poor wound healing, tendon damage, vascular damage, nerve damage and need for additional repair Universal protocol:    Procedure explained and questions answered to patient or proxy's satisfaction: yes     Patient identity confirmed:  Verbally with patient and provided demographic data Anesthesia:    Anesthesia method:  Nerve block   Block location:  Digital   Block needle gauge:  25 G   Block anesthetic:  Bupivacaine 0.5% w/o epi   Block injection procedure:  Anatomic landmarks identified, anatomic landmarks palpated, introduced needle, negative aspiration for blood and incremental injection   Block outcome:  Anesthesia achieved Laceration details:    Location:  Finger   Finger location:  L small finger   Length (cm):  2 Pre-procedure details:    Preparation:  Patient was  prepped and draped in usual sterile fashion Exploration:    Limited defect created (wound extended): no     Imaging obtained: x-ray     Imaging outcome: foreign body not noted  Wound exploration: wound explored through full range of motion and entire depth of wound visualized   Treatment:    Area cleansed with:  Povidone-iodine and saline   Amount of cleaning:  Extensive   Irrigation solution:  Sterile saline   Irrigation volume:  1 L   Irrigation method:  Syringe   Debridement:  None   Undermining:  None   Scar revision: no   Skin repair:    Repair method:  Sutures   Suture size:  4-0   Wound skin closure material used: Vicryl.   Suture technique:  Horizontal mattress   Number of sutures:  5 Approximation:    Approximation:  Loose Repair type:    Repair type:  Intermediate Post-procedure details:    Dressing:  Non-adherent dressing   Procedure completion:  Tolerated well, no immediate complications Comments:     Increased difficulty for this wound closure as the skin demonstrated some weakness from being in the bandage likely for 24 hours.     Medications Ordered in ED Medications  Tdap (BOOSTRIX) injection 0.5 mL (0.5 mLs Intramuscular Given 02/28/21 1509)  bupivacaine (MARCAINE) 0.5 % injection 10 mL (10 mLs Infiltration Given 02/28/21 1400)    ED Course  I have reviewed the triage vital signs and the nursing notes.  Pertinent labs & imaging results that were available during my care of the patient were reviewed by me and considered in my medical decision making (see chart for details).    MDM Rules/Calculators/A&P                          Patient presents with a left small finger injury.  No noted neurologic or functional deficit. Although the wound occurred yesterday, it is gaping and would benefit from some closure.  Findings and plan of care discussed with attending physician, Marguarite Arbour, MD. Dr. Renaye Rakers personally evaluated and examined this  patient.     Final Clinical Impression(s) / ED Diagnoses Final diagnoses:  Laceration of left little finger without foreign body without damage to nail, initial encounter    Rx / DC Orders ED Discharge Orders         Ordered    cephALEXin (KEFLEX) 500 MG capsule  4 times daily        02/28/21 1453           Concepcion Living 03/01/21 1032    Terald Sleeper, MD 03/01/21 [phone]

## 2021-02-28 NOTE — ED Notes (Signed)
Pt is in xray at this time, radiology will bring this patient back to room 17.

## 2021-02-28 NOTE — Discharge Instructions (Addendum)
  Wound Care - Laceration You may remove the bandage after 24 hours. Clean the wound and surrounding area gently with tap water and mild soap. Rinse well and blot dry. Do not scrub the wound, as this may cause the wound edges to come apart. You may shower, but avoid submerging the wound, such as with a bath or swimming. Clean the wound daily to prevent infection. Do not use cleaners such as hydrogen peroxide or alcohol.   Scar reduction: Application of a topical antibiotic ointment, such as Neosporin, after the wound has begun to close and heal well can decrease scab formation and reduce scarring. After the wound has healed and wound closures have been removed, application of ointments such as Aquaphor can also reduce scar formation.  The key to scar reduction is keeping the skin well hydrated and supple. Drinking plenty of water throughout the day (At least eight 8oz glasses of water a day) is essential to staying well hydrated.  Sun exposure: Keep the wound out of the sun. After the wound has healed, continue to protect it from the sun by wearing protective clothing or applying sunscreen.  Pain: You may use Tylenol, naproxen, or ibuprofen for pain.  Suture/staple removal: The sutures should dissolve on their own.  Typically, they will fall out within 10 to 14 days.  If they are still present and bothersome after this time, you may return to the emergency department, go to an urgent care, or go to your primary care provider for suture removal.  Return to the ED sooner should the wound edges come apart or signs of infection arise, such as spreading redness, puffiness/swelling, pus draining from the wound, severe increase in pain, fever over 100.79F, or any other major issues.  For prescription assistance, may try using prescription discount sites or apps, such as goodrx.com

## 2021-03-16 ENCOUNTER — Encounter (HOSPITAL_COMMUNITY): Payer: Self-pay | Admitting: Emergency Medicine

## 2021-03-16 ENCOUNTER — Emergency Department (HOSPITAL_COMMUNITY)
Admission: EM | Admit: 2021-03-16 | Discharge: 2021-03-17 | Disposition: A | Discharge status: Home / Self Care | Payer: Medicare HMO | Attending: Emergency Medicine | Admitting: Emergency Medicine

## 2021-03-16 ENCOUNTER — Other Ambulatory Visit: Payer: Self-pay

## 2021-03-16 DIAGNOSIS — Y9301 Activity, walking, marching and hiking: Secondary | ICD-10-CM | POA: Insufficient documentation

## 2021-03-16 DIAGNOSIS — S92352A Displaced fracture of fifth metatarsal bone, left foot, initial encounter for closed fracture: Secondary | ICD-10-CM | POA: Insufficient documentation

## 2021-03-16 DIAGNOSIS — Z79899 Other long term (current) drug therapy: Secondary | ICD-10-CM | POA: Insufficient documentation

## 2021-03-16 DIAGNOSIS — S99922A Unspecified injury of left foot, initial encounter: Secondary | ICD-10-CM | POA: Diagnosis present

## 2021-03-16 DIAGNOSIS — W108XXA Fall (on) (from) other stairs and steps, initial encounter: Secondary | ICD-10-CM | POA: Diagnosis not present

## 2021-03-16 DIAGNOSIS — F17210 Nicotine dependence, cigarettes, uncomplicated: Secondary | ICD-10-CM | POA: Diagnosis not present

## 2021-03-16 DIAGNOSIS — J449 Chronic obstructive pulmonary disease, unspecified: Secondary | ICD-10-CM | POA: Diagnosis not present

## 2021-03-16 DIAGNOSIS — S50312A Abrasion of left elbow, initial encounter: Secondary | ICD-10-CM | POA: Diagnosis not present

## 2021-03-16 NOTE — ED Triage Notes (Signed)
Patient tripped and fell while walking his dog , no LOC/ambulatory , reports pain at left foot .

## 2021-03-17 ENCOUNTER — Emergency Department (HOSPITAL_COMMUNITY): Payer: Medicare HMO

## 2021-03-17 DIAGNOSIS — S92352A Displaced fracture of fifth metatarsal bone, left foot, initial encounter for closed fracture: Secondary | ICD-10-CM | POA: Diagnosis not present

## 2021-03-17 MED ORDER — OXYCODONE-ACETAMINOPHEN 5-325 MG PO TABS
1.0000 | ORAL_TABLET | Freq: Once | ORAL | Status: AC
  Administered 2021-03-17: 1 via ORAL
  Filled 2021-03-17: qty 1

## 2021-03-17 NOTE — ED Provider Notes (Signed)
[facility] EMERGENCY DEPARTMENT Provider Note   CSN: [id number] Arrival date & time: 03/16/21  1819     History Chief Complaint  Patient presents with  . Foot Injury    Gabriel Burns is a 50 y.o. male.  HPI     This is a 50 year old male with a history of multiple sclerosis, COPD who presents with left foot pain.  Patient reports that he was walking down some steps while walking his dog.  He states that his dog ran to the right and patient fell injuring his left foot and left elbow.  He did not hit his head or lose consciousness.  He rates his pain at 10 out of 10.  At baseline he uses hydrocodone at home for pain.  He denies any new numbness or tingling of the foot but states in general he has some numbness related to his MS.  Past Medical History:  Diagnosis Date  . Chronic lower back pain   . COPD (chronic obstructive pulmonary disease) (HCC)   . Depression   . History of kidney stones   . Migraine    "2-4 times/year" (08/30/2017)  . Multiple sclerosis (HCC)   . 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 Spinal headache    woke up during procedure    Patient Active Problem List   Diagnosis Date Noted  . Forearm laceration, right, initial encounter 04/18/2018  . Wound drainage 09/06/2017  . 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 Bulging lumbar disc 04/04/2017  . Acute right-sided thoracic back pain 04/04/2017  . Sciatica of right side 10/29/2016  . Alcohol abuse 10/08/2016  . Erectile dysfunction 02/09/2016  . Depression 01/08/2016  . MS (multiple sclerosis) (HCC) 05/22/2015  . COPD (chronic obstructive pulmonary disease) (HCC) 05/22/2015  . Tobacco use disorder 05/22/2015  . Chronic pain 05/22/2015    Past Surgical History:  Procedure Laterality Date  . BACK SURGERY    . EPIDURAL BLOOD PATCH  ~ 11//2018   "for CSF leak"  . LITHOTRIPSY    . LUMBAR LAMINECTOMY  07/24/2017   "took piece off vertebrae; had a bulging herniated disc"  . LUMBAR WOUND  DEBRIDEMENT N/A 09/07/2017   Procedure: Lumbar wound exploration with placement of lumbar drain;  Surgeon: Ditty, Loura Halt, MD;  Location: [facility] OR;  Service: Neurosurgery;  Laterality: N/A;  . PLACEMENT OF LUMBAR DRAIN N/A 09/07/2017   Procedure: PLACEMENT OF LUMBAR DRAIN;  Surgeon: Ditty, Loura Halt, MD;  Location: [facility] OR;  Service: Neurosurgery;  Laterality: N/A;  . REPAIR OF CEREBROSPINAL FLUID LEAK N/A 08/30/2017   Procedure: 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;  Surgeon: Ditty, Loura Halt, MD;  Location: [facility] OR;  Service: Neurosurgery;  Laterality: N/A;  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  . WOUND EXPLORATION  08/30/2017   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/notes 08/30/2017       Family History  Problem Relation Age of Onset  . Hypertension Father   . Alcohol abuse Paternal Grandfather     Social History   Tobacco Use  . Smoking status: Current Every Day Smoker    Packs/day: 1.00    Years: 30.00    Pack years: 30.00    Types: Cigars, Cigarettes  . Smokeless tobacco: Never Used  Vaping Use  . Vaping Use: Never used  Substance Use Topics  . Alcohol use: No  . Drug use: Yes    Types: Marijuana    Comment: 08/30/2017 "couple times/day"    Home  Medications Prior to Admission medications   Medication Sig Start Date End Date Taking? Authorizing Provider  amphetamine-dextroamphetamine (ADDERALL) 20 MG tablet Take 1 tablet (20 mg total) by mouth daily. Patient taking differently: Take 20 mg by mouth 2 (two) times daily. 08/12/17   Nche, Bonna Gains, NP  buPROPion (WELLBUTRIN SR) 150 MG 12 hr tablet Take 150 mg by mouth 2 (two) times daily. 01/06/21   [provider]  cephALEXin (KEFLEX) 500 MG capsule Take 1 capsule (500 mg total) by mouth 4 (four) times daily. 02/28/21   Joy, Shawn C, PA-C  finasteride (PROSCAR) 5 MG tablet Take 5 mg by mouth daily. 01/23/21   [provider]   gabapentin (NEURONTIN) 300 MG capsule Take 1 capsule (300 mg total) by mouth at bedtime. Patient taking differently: Take 600 mg by mouth 2 (two) times daily. 08/12/17   Nche, Bonna Gains, NP  HYDROcodone-acetaminophen (NORCO) 10-325 MG tablet Take 1 tablet by mouth 4 (four) times daily as needed for pain. 02/23/21   [provider]  Ofatumumab (KESIMPTA) 20 MG/0.4ML SOAJ Inject 20 mg into the skin every 28 (twenty-eight) days. 04/08/20   [provider]  oxybutynin (DITROPAN-XL) 5 MG 24 hr tablet Take 1 tablet (5 mg total) by mouth at bedtime. Patient not taking: Reported on 02/28/2021 08/12/17   Nche, Bonna Gains, NP  oxyCODONE (OXY IR/ROXICODONE) 5 MG immediate release tablet Take 1-2 tablets (5-10 mg total) by mouth every 4 (four) hours as needed for moderate pain. Patient not taking: No sig reported 09/11/17   Tressie Stalker, MD  Oxycodone HCl 10 MG TABS Take 1 tablet (10 mg total) by mouth 3 (three) times daily. Patient not taking: No sig reported 08/12/17   Nche, Bonna Gains, NP  sildenafil (REVATIO) 20 MG tablet Take 1-2tabs prior to activity as needed. Patient taking differently: Take 20-40 mg by mouth daily as needed (Prior to sexual activity). 08/12/17   Nche, Bonna Gains, NP  venlafaxine XR (EFFEXOR-XR) 150 MG 24 hr capsule Take 150 mg by mouth daily. 12/26/20   [provider]    Allergies    Patient has no known allergies.  Review of Systems   Review of Systems  Constitutional: Negative for fever.  Respiratory: Negative for shortness of breath.   Cardiovascular: Negative for chest pain.  Gastrointestinal: Negative for abdominal pain.  Musculoskeletal:       Left ankle pain and elbow pain  Skin: Positive for color change and wound.  All other systems reviewed and are negative.   Physical Exam Updated Vital Signs BP (!) 146/90   Pulse 73   Temp 97.8 F (36.6 C) (Oral)   Resp 14   Ht 1.778 m (5\' 10" )   Wt 85 kg   SpO2 97%   BMI 26.89  kg/m   Physical Exam Vitals and nursing note reviewed.  Constitutional:      Appearance: He is well-developed. He is not ill-appearing.  HENT:     Head: Normocephalic and atraumatic.     Mouth/Throat:     Mouth: Mucous membranes are moist.  Eyes:     Pupils: Pupils are equal, round, and reactive to light.  Cardiovascular:     Rate and Rhythm: Normal rate and regular rhythm.     Heart sounds: Normal heart sounds. No murmur heard.   Pulmonary:     Effort: Pulmonary effort is normal. No respiratory distress.     Breath sounds: Normal breath sounds.  Abdominal:     Palpations:  Abdomen is soft.     Tenderness: There is no abdominal tenderness.  Musculoskeletal:     Cervical back: Normal range of motion and neck supple. No tenderness.     Comments: Normal range of motion of the left elbow, slight abrasion noted over the elbow, no obvious deformities or effusion Tenderness to palpation over the lateral aspect of the midfoot, 2+ DP pulse, neurovascular intact distally  Lymphadenopathy:     Cervical: No cervical adenopathy.  Skin:    General: Skin is warm and dry.     Comments: Extensive bruising and discoloration of the lateral dorsal aspect of the left foot mostly  Neurological:     Mental Status: He is alert and oriented to person, place, and time.  Psychiatric:        Mood and Affect: Mood normal.     ED Results / Procedures / Treatments   Labs (all labs ordered are listed, but only abnormal results are displayed) Labs Reviewed - No data to display  EKG None  Radiology DG Elbow Complete Left  Result Date: 03/17/2021 CLINICAL DATA:  Status post fall EXAM: LEFT ELBOW - COMPLETE 3+ VIEW COMPARISON:  None. FINDINGS: There is no evidence of fracture, dislocation, or joint effusion. There is no evidence of arthropathy or other focal bone abnormality. Soft tissues are unremarkable. IMPRESSION: Negative. Electronically Signed   By: Tish Frederickson M.D.   On: 03/17/2021 01:08    DG Foot Complete Left  Result Date: 03/17/2021 CLINICAL DATA:  Status post fall EXAM: LEFT FOOT - COMPLETE 3+ VIEW COMPARISON:  None. FINDINGS: Acute 1/3-1/4 shaft width displaced diagonal fracture through the fifth digit metatarsal shaft. No intra-articular extension. No dislocation. There is no evidence of arthropathy or other focal bone abnormality. Soft tissues are unremarkable. IMPRESSION: Acute displaced diagonal fracture through the fifth digit metatarsal shaft. Electronically Signed   By: Tish Frederickson M.D.   On: 03/17/2021 01:09    Procedures Procedures   Medications Ordered in ED Medications  oxyCODONE-acetaminophen (PERCOCET/ROXICET) 5-325 MG per tablet 1 tablet (has no administration in time range)  oxyCODONE-acetaminophen (PERCOCET/ROXICET) 5-325 MG per tablet 1 tablet (1 tablet Oral Given 03/17/21 0006)    ED Course  I have reviewed the triage vital signs and the nursing notes.  Pertinent labs & imaging results that were available during my care of the patient were reviewed by me and considered in my medical decision making (see chart for details).    MDM Rules/Calculators/A&P                          Patient presents with fall.  Reports pain to the left elbow and left foot.  Considerations include but not limited to, fracture, sprain, abrasion.  Patient was given pain medication.  X-rays obtained and reviewed.  Left elbow film without evidence of acute fracture or dislocation.  Left foot film with a displaced left fifth metatarsal fracture.  Patient is neurovascular intact.  He was placed in a posterior splint and provided crutches.  He will be given orthopedic follow-up.  After history, exam, and medical workup I feel the patient has been appropriately medically screened and is safe for discharge home. Pertinent diagnoses were discussed with the patient. Patient was given return precautions.  Final Clinical Impression(s) / ED Diagnoses Final diagnoses:  Closed  displaced fracture of fifth metatarsal bone of left foot, initial encounter    Rx / DC Orders ED Discharge Orders    None  Shon BatonHorton, Douglass Dunshee F, MD 03/17/21 [phone]

## 2021-03-17 NOTE — Discharge Instructions (Addendum)
You were seen today for foot pain and injury.  Your x-rays show metatarsal fracture.  Keep iced and elevated.  Do not bear weight until follow-up with orthopedics.  Take your home pain medication as prescribed.  You may add ibuprofen for breakthrough pain.

## 2021-03-17 NOTE — ED Notes (Signed)
Called for pt, did not respond x1

## 2021-03-17 NOTE — Progress Notes (Signed)
Orthopedic Tech Progress Note Patient Details:  JEWELL RYANS 1971/04/21 [id number]  Ortho Devices Type of Ortho Device: Crutches,Post (short leg) splint Ortho Device/Splint Location: lle Ortho Device/Splint Interventions: Ordered,Application,Adjustment   Post Interventions Patient Tolerated: Well Instructions Provided: Care of device,Adjustment of device   Trinna Post 03/17/2021, 4:55 AM

## 2021-03-17 NOTE — ED Provider Notes (Signed)
Emergency Medicine Provider Triage Evaluation Note  Gabriel Burns , a 50 y.o. male  was evaluated in triage.  Pt complains of fall.  The patient fell earlier tonight while he was walking his dog.  He landed on his left side and is endorsing left elbow pain and left foot pain.  He denies hitting his head, loss of consciousness, vomiting.  No treatment prior to arrival.  He has been unable to weight-bear on his left leg since the fall.  No treatment prior to arrival.  Patient's Tdap was updated during his last ED visit.  Review of Systems  Positive: Foot pain, elbow pain Negative: Syncope, vomiting  Physical Exam  BP (!) 150/94 (BP Location: Left Arm)   Pulse 83   Temp 98.3 F (36.8 C) (Oral)   Resp 18   Ht 5\' 10"  (1.778 m)   Wt 85 kg   SpO2 100%   BMI 26.89 kg/m  Gen:   Awake, no distress   Resp:  Normal effort  MSK:   Moves extremities without difficulty  Other:  Ecchymosis and swelling noted to the left foot.  Tender to palpation to the left foot.  Swelling and tenderness to palpation noted to the left elbow.  No tenderness to the left ankle or knee.  Sensation is intact and equal.  Independently moves all digits of the left foot.  Peripheral pulses are 2+ and symmetric.  Decree strength against resistance secondary to pain.  Medical Decision Making  Medically screening exam initiated at 12:01 AM.  Appropriate orders placed.  [name] was informed that the remainder of the evaluation will be completed by another provider, this initial triage assessment does not replace that evaluation, and the importance of remaining in the ED until their evaluation is complete.  His work-up has been initiated in the emergency department.   Binnie Rail A, PA-C 03/17/21 0004    05/17/21, MD 03/17/21 [phone]

## 2021-03-26 ENCOUNTER — Other Ambulatory Visit: Payer: Self-pay | Admitting: Registered Nurse

## 2021-04-04 ENCOUNTER — Inpatient Hospital Stay: Admission: RE | Admit: 2021-04-04 | Payer: Medicaid Other | Source: Ambulatory Visit

## 2021-08-17 ENCOUNTER — Emergency Department (HOSPITAL_COMMUNITY)
Admission: EM | Admit: 2021-08-17 | Discharge: 2021-08-17 | Disposition: A | Discharge status: Home / Self Care | Payer: Medicare HMO | Attending: Emergency Medicine | Admitting: Emergency Medicine

## 2021-08-17 ENCOUNTER — Emergency Department (HOSPITAL_COMMUNITY): Payer: Medicare HMO

## 2021-08-17 ENCOUNTER — Other Ambulatory Visit: Payer: Self-pay

## 2021-08-17 DIAGNOSIS — M542 Cervicalgia: Secondary | ICD-10-CM | POA: Insufficient documentation

## 2021-08-17 DIAGNOSIS — Y92410 Unspecified street and highway as the place of occurrence of the external cause: Secondary | ICD-10-CM | POA: Diagnosis not present

## 2021-08-17 DIAGNOSIS — M546 Pain in thoracic spine: Secondary | ICD-10-CM | POA: Insufficient documentation

## 2021-08-17 DIAGNOSIS — Z5321 Procedure and treatment not carried out due to patient leaving prior to being seen by health care provider: Secondary | ICD-10-CM | POA: Insufficient documentation

## 2021-08-17 DIAGNOSIS — R0789 Other chest pain: Secondary | ICD-10-CM | POA: Insufficient documentation

## 2021-08-17 LAB — BASIC METABOLIC PANEL
Anion gap: 9 (ref 5–15)
BUN: 8 mg/dL (ref 6–20)
CO2: 27 mmol/L (ref 22–32)
Calcium: 9.2 mg/dL (ref 8.9–10.3)
Chloride: 102 mmol/L (ref 98–111)
Creatinine, Ser: 0.96 mg/dL (ref 0.61–1.24)
GFR, Estimated: >60 mL/min (ref >60)
Glucose, Bld: 83 mg/dL (ref 70–99)
Potassium: 3.6 mmol/L (ref 3.5–5.1)
Sodium: 138 mmol/L (ref 135–145)

## 2021-08-17 LAB — CBC WITH DIFFERENTIAL/PLATELET
Abs Immature Granulocytes: 0.02 10*3/uL (ref 0.00–0.07)
Basophils Absolute: 0.1 10*3/uL (ref 0.0–0.1)
Basophils Relative: 1 %
Eosinophils Absolute: 0.2 10*3/uL (ref 0.0–0.5)
Eosinophils Relative: 3 %
HCT: 41.1 % (ref 39.0–52.0)
Hemoglobin: 13.7 g/dL (ref 13.0–17.0)
Immature Granulocytes: 0 %
Lymphocytes Relative: 25 %
Lymphs Abs: 2.1 10*3/uL (ref 0.7–4.0)
MCH: 32.6 pg (ref 26.0–34.0)
MCHC: 33.3 g/dL (ref 30.0–36.0)
MCV: 97.9 fL (ref 80.0–100.0)
Monocytes Absolute: 0.9 10*3/uL (ref 0.1–1.0)
Monocytes Relative: 11 %
Neutro Abs: 5 10*3/uL (ref 1.7–7.7)
Neutrophils Relative %: 60 %
Platelets: 313 10*3/uL (ref 150–400)
RBC: 4.2 MIL/uL — ABNORMAL LOW (ref 4.22–5.81)
RDW: 13.2 % (ref 11.5–15.5)
WBC: 8.3 10*3/uL (ref 4.0–10.5)
nRBC: 0 % (ref 0.0–0.2)
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
## 2021-08-17 NOTE — ED Notes (Signed)
Pt called multiple times no answer 
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
## 2022-03-24 ENCOUNTER — Other Ambulatory Visit: Payer: Self-pay

## 2022-03-24 ENCOUNTER — Encounter (HOSPITAL_COMMUNITY): Payer: Self-pay

## 2022-03-24 ENCOUNTER — Emergency Department (HOSPITAL_COMMUNITY)
Admission: EM | Admit: 2022-03-24 | Discharge: 2022-03-25 | Disposition: A | Discharge status: Home / Self Care | Payer: Medicare Other | Attending: Emergency Medicine | Admitting: Emergency Medicine

## 2022-03-24 DIAGNOSIS — Y929 Unspecified place or not applicable: Secondary | ICD-10-CM | POA: Insufficient documentation

## 2022-03-24 DIAGNOSIS — S99922A Unspecified injury of left foot, initial encounter: Secondary | ICD-10-CM | POA: Diagnosis present

## 2022-03-24 DIAGNOSIS — R739 Hyperglycemia, unspecified: Secondary | ICD-10-CM | POA: Diagnosis not present

## 2022-03-24 DIAGNOSIS — Z79891 Long term (current) use of opiate analgesic: Secondary | ICD-10-CM | POA: Diagnosis not present

## 2022-03-24 DIAGNOSIS — Y998 Other external cause status: Secondary | ICD-10-CM | POA: Diagnosis not present

## 2022-03-24 DIAGNOSIS — W5911XA Bitten by nonvenomous snake, initial encounter: Secondary | ICD-10-CM

## 2022-03-24 DIAGNOSIS — T63091A Toxic effect of venom of other snake, accidental (unintentional), initial encounter: Secondary | ICD-10-CM | POA: Diagnosis not present

## 2022-03-24 DIAGNOSIS — S91352A Open bite, left foot, initial encounter: Secondary | ICD-10-CM | POA: Diagnosis not present

## 2022-03-24 LAB — BASIC METABOLIC PANEL
Anion gap: 9 (ref 5–15)
BUN: 15 mg/dL (ref 6–20)
CO2: 24 mmol/L (ref 22–32)
Calcium: 8.9 mg/dL (ref 8.9–10.3)
Chloride: 105 mmol/L (ref 98–111)
Creatinine, Ser: 1.11 mg/dL (ref 0.61–1.24)
GFR, Estimated: >60 mL/min (ref >60)
Glucose, Bld: 120 mg/dL — ABNORMAL HIGH (ref 70–99)
Potassium: 3.8 mmol/L (ref 3.5–5.1)
Sodium: 138 mmol/L (ref 135–145)

## 2022-03-24 LAB — CBC WITH DIFFERENTIAL/PLATELET
Abs Immature Granulocytes: 0.02 10*3/uL (ref 0.00–0.07)
Basophils Absolute: 0.1 10*3/uL (ref 0.0–0.1)
Basophils Relative: 1 %
Eosinophils Absolute: 0.4 10*3/uL (ref 0.0–0.5)
Eosinophils Relative: 4 %
HCT: 41.6 % (ref 39.0–52.0)
Hemoglobin: 14.2 g/dL (ref 13.0–17.0)
Immature Granulocytes: 0 %
Lymphocytes Relative: 25 %
Lymphs Abs: 2 10*3/uL (ref 0.7–4.0)
MCH: 32.6 pg (ref 26.0–34.0)
MCHC: 34.1 g/dL (ref 30.0–36.0)
MCV: 95.6 fL (ref 80.0–100.0)
Monocytes Absolute: 0.8 10*3/uL (ref 0.1–1.0)
Monocytes Relative: 9 %
Neutro Abs: 4.9 10*3/uL (ref 1.7–7.7)
Neutrophils Relative %: 61 %
Platelets: 288 10*3/uL (ref 150–400)
RBC: 4.35 MIL/uL (ref 4.22–5.81)
RDW: 13.2 % (ref 11.5–15.5)
WBC: 8.1 10*3/uL (ref 4.0–10.5)
nRBC: 0 % (ref 0.0–0.2)

## 2022-03-24 LAB — PROTIME-INR
INR: 1 (ref 0.8–1.2)
Prothrombin Time: 13.1 seconds (ref 11.4–15.2)

## 2022-03-24 LAB — APTT: aPTT: 33 seconds (ref 24–36)

## 2022-03-24 LAB — FIBRINOGEN: Fibrinogen: 349 mg/dL (ref 210–475)

## 2022-03-24 MED ORDER — SODIUM CHLORIDE 0.9 % IV BOLUS
1500.0000 mL | Freq: Once | INTRAVENOUS | Status: AC
  Administered 2022-03-24: 1500 mL via INTRAVENOUS

## 2022-03-24 MED ORDER — HYDROMORPHONE HCL 1 MG/ML IJ SOLN
0.5000 mg | Freq: Once | INTRAMUSCULAR | Status: AC
  Administered 2022-03-24: 0.5 mg via INTRAVENOUS
  Filled 2022-03-24: qty 1

## 2022-03-24 MED ORDER — SODIUM CHLORIDE 0.9 % IV SOLN: INTRAVENOUS | Status: DC

## 2022-03-24 NOTE — ED Triage Notes (Signed)
Pt to ED POV from home.  Pt was walking his dog, had sandals on, felt something like an electricity jolt to left foot. When pt looked down he saw baby copperhead (pt has deceased snake with him).  Pt has small bruising area to left heel. This occurred approximately ago.

## 2022-03-24 NOTE — ED Provider Notes (Signed)
[facility] EMERGENCY DEPARTMENT Provider Note   CSN: [id number] Arrival date & time: 03/24/22  2149     History  Chief Complaint  Patient presents with   Snake Bite    Gabriel Burns is a 51 y.o. male who presents with snakebite of the left heel.  Patient was bitten on the left heel at approximately 9:00 PM tonight, 2 hours ago. He states that he has a "burning sensation like a hot poker" in his left heel. No numbness or paresthesia. Patient brought the snake in a plastic bag. It appears to be a juvenile copperhead. Last tetanus vaccination on 02/28/2021 HPI     Home Medications Prior to Admission medications   Medication Sig Start Date End Date Taking? Authorizing Provider  amphetamine-dextroamphetamine (ADDERALL) 20 MG tablet Take 1 tablet (20 mg total) by mouth daily. Patient taking differently: Take 20 mg by mouth 2 (two) times daily. 08/12/17   Nche, Bonna Gains, NP  buPROPion (WELLBUTRIN SR) 150 MG 12 hr tablet Take 150 mg by mouth 2 (two) times daily. 01/06/21   [provider]  cephALEXin (KEFLEX) 500 MG capsule Take 1 capsule (500 mg total) by mouth 4 (four) times daily. 02/28/21   Joy, Shawn C, PA-C  finasteride (PROSCAR) 5 MG tablet Take 5 mg by mouth daily. 01/23/21   [provider]  gabapentin (NEURONTIN) 300 MG capsule Take 1 capsule (300 mg total) by mouth at bedtime. Patient taking differently: Take 600 mg by mouth 2 (two) times daily. 08/12/17   Nche, Bonna Gains, NP  HYDROcodone-acetaminophen (NORCO) 10-325 MG tablet Take 1 tablet by mouth 4 (four) times daily as needed for pain. 02/23/21   [provider]  Ofatumumab (KESIMPTA) 20 MG/0.4ML SOAJ Inject 20 mg into the skin every 28 (twenty-eight) days. 04/08/20   [provider]  oxybutynin (DITROPAN-XL) 5 MG 24 hr tablet Take 1 tablet (5 mg total) by mouth at bedtime. Patient not taking: Reported on 02/28/2021 08/12/17   Nche, Bonna Gains, NP  oxyCODONE (OXY  IR/ROXICODONE) 5 MG immediate release tablet Take 1-2 tablets (5-10 mg total) by mouth every 4 (four) hours as needed for moderate pain. Patient not taking: No sig reported 09/11/17   Tressie Stalker, MD  Oxycodone HCl 10 MG TABS Take 1 tablet (10 mg total) by mouth 3 (three) times daily. Patient not taking: No sig reported 08/12/17   Nche, Bonna Gains, NP  sildenafil (REVATIO) 20 MG tablet Take 1-2tabs prior to activity as needed. Patient taking differently: Take 20-40 mg by mouth daily as needed (Prior to sexual activity). 08/12/17   Nche, Bonna Gains, NP  venlafaxine XR (EFFEXOR-XR) 150 MG 24 hr capsule Take 150 mg by mouth daily. 12/26/20   [provider]      Allergies    Patient has no known allergies.    Review of Systems   Review of Systems  Physical Exam Updated Vital Signs BP (!) 145/100   Pulse 75   Temp 98.5 F (36.9 C) (Oral)   Resp 12   Ht 5\' 10"  (1.778 m)   Wt 77.1 kg   SpO2 98%   BMI 24.39 kg/m  Physical Exam Vitals and nursing note reviewed.  Constitutional:      General: He is not in acute distress.    Appearance: He is well-developed. He is not diaphoretic.  HENT:     Head: Normocephalic and atraumatic.  Eyes:     General: No scleral icterus.    Conjunctiva/sclera:  Conjunctivae normal.  Cardiovascular:     Rate and Rhythm: Normal rate and regular rhythm.     Heart sounds: Normal heart sounds.  Pulmonary:     Effort: Pulmonary effort is normal. No respiratory distress.     Breath sounds: Normal breath sounds.  Abdominal:     Palpations: Abdomen is soft.     Tenderness: There is no abdominal tenderness.  Musculoskeletal:     Cervical back: Normal range of motion and neck supple.  Skin:    General: Skin is warm and dry.     Comments: There is a small puncture wound to the left heal with mild bruising noted.  Neurological:     Mental Status: He is alert.  Psychiatric:        Behavior: Behavior normal.      ED Results / Procedures /  Treatments   Labs (all labs ordered are listed, but only abnormal results are displayed) Labs Reviewed  BASIC METABOLIC PANEL - Abnormal; Notable for the following components:      Result Value   Glucose, Bld 120 (*)    All other components within normal limits  CBC WITH DIFFERENTIAL/PLATELET  FIBRINOGEN  PROTIME-INR  APTT    EKG None  Radiology No results found.  Procedures Procedures    Medications Ordered in ED Medications  sodium chloride 0.9 % bolus 1,500 mL (0 mLs Intravenous Stopped 03/25/22 0110)  HYDROmorphone (DILAUDID) injection 0.5 mg (0.5 mg Intravenous Given 03/24/22 2305)  HYDROmorphone (DILAUDID) injection 0.5 mg (0.5 mg Intravenous Given 03/25/22 0315)    ED Course/ Medical Decision Making/ A&P Clinical Course as of 03/26/22 0202  Thu Mar 25, 2022  0053 Patient has had no progression in erythema or swelling [AH]    Clinical Course User Index [AH] Arthor Captain, PA-C                           Medical Decision Making Patient here after copperhead bite to the left foot. Patient observed for period of 8 hours without any extension of erythema or swelling of the envenomation site.  Pain controlled here in the emergency department.  He has no evidence of coagulopathy on lab evaluation.  Patient appears safe for discharge.  PDMP reviewed and patient is on chronic pain meds which he may use for pain control at home.  Problems Addressed: Snake bite, initial encounter: acute illness or injury  Amount and/or Complexity of Data Reviewed Labs: ordered.    Details: Labs reviewed, mild hyperglycemia.  Remainder of labs are within normal limits, no evidence of coagulopathy  Risk Prescription drug management.           Final Clinical Impression(s) / ED Diagnoses Final diagnoses:  Snake bite, initial encounter    Rx / DC Orders ED Discharge Orders     None         Arthor Captain, PA-C 03/26/22 0202    Zadie Rhine, MD 03/26/22  [phone]

## 2022-03-24 NOTE — ED Provider Triage Note (Signed)
Emergency Medicine Provider Triage Evaluation Note  Gabriel Burns , a 51 y.o. male  was evaluated in triage.  Pt complains of snakebite to his left heel that occurred just prior to arrival.  This was a small copperhead.  Patient has snake with him in a small Ziploc bag.  Snake is dead.   Review of Systems  Positive: As above Negative: As above  Physical Exam  BP (!) 144/94 (BP Location: Left Arm)   Pulse (!) 103   Temp 98.5 F (36.9 C) (Oral)   Resp 18   SpO2 97%  Gen:   Awake, no distress   Resp:  Normal effort  MSK:   Moves extremities without difficulty  Other:  Puncture wound noted to left heel.  Without surrounding erythema or swelling.  2+ DP pulse present.  Medical Decision Making  Medically screening exam initiated at 10:00 PM.  Appropriate orders placed.  JASTIN FORE was informed that the remainder of the evaluation will be completed by another provider, this initial triage assessment does not replace that evaluation, and the importance of remaining in the ED until their evaluation is complete.     Marita Kansas, PA-C 03/24/22 2201

## 2022-03-25 DIAGNOSIS — S91352A Open bite, left foot, initial encounter: Secondary | ICD-10-CM | POA: Diagnosis not present

## 2022-03-25 MED ORDER — HYDROMORPHONE HCL 1 MG/ML IJ SOLN
0.5000 mg | Freq: Once | INTRAMUSCULAR | Status: AC
  Administered 2022-03-25: 0.5 mg via INTRAVENOUS
  Filled 2022-03-25: qty 1

## 2022-03-25 NOTE — Discharge Instructions (Signed)
Use your prescribed Oxycodone at home for pain control. You may take tylenol and advil as well.  Contact a health care provider if you have: Increased redness, swelling, or pain at the site of your wound. Fluid, blood, or pus coming from your wound. A fever. Get help right away if: You develop blood blisters or purple spots in the bite area. You have: Nausea or vomiting. Numbness or tingling. Excessive sweating. Trouble breathing. Trouble seeing. You feel very confused. You feel faint or light-headed.

## 2022-03-25 NOTE — ED Notes (Signed)
RN reviewed discharge instructions with pt. Pt verbalized understanding and had no further questions. VSS upon discharge.  

## 2022-05-19 NOTE — Progress Notes (Deleted)
[facility]  PATIENT: Gabriel Burns DOB: Nov 29, 1970  REFERRING DOCTOR OR PCP:  *** SOURCE: ***  _________________________________   HISTORICAL  CHIEF COMPLAINT:  No chief complaint on file.   HISTORY OF PRESENT ILLNESS:  ***  Imaging: MRI of the brain 02/13/2016 shows T2/FLAIR hyperintense foci in the periventricular, juxtacortical and deep white matter of the hemispheres consistent with chronic demyelination though some changes could also be due to chronic microvascular ischemic disease.  Chronic sinusitis is noted.  Foci also noted at the cervicomedullary junction and in the medulla and pons.  Normal enhancement pattern.  MRI of the cervical spine 02/13/2016 shows T2 hyperintense foci within the spinal cord anteriorly slightly to the right adjacent to C2, towards the right adjacent to C3, centrally adjacent to C5, posteriorly adjacent to C6.  Foci also noted in the medulla, and pons.  These are consistent with chronic demyelinating plaque.  None of the foci enhance after contrast.  MRI of the thoracic spine 02/16/2016 shows T2 hyperintense foci within the spinal cord Adjacent to T6-T7 to the left,  adjacent to T8-T9 bilaterally and adjacent to T11-T12.  Normal enhancement pattern.  Mild degenerative changes at T4-T5 and T5-T6 not leading to spinal stenosis.  REVIEW OF SYSTEMS: Constitutional: No fevers, chills, sweats, or change in appetite Eyes: No visual changes, double vision, eye pain Ear, nose and throat: No hearing loss, ear pain, nasal congestion, sore throat Cardiovascular: No chest pain, palpitations Respiratory:  No shortness of breath at rest or with exertion.   No wheezes GastrointestinaI: No nausea, vomiting, diarrhea, abdominal pain, fecal incontinence Genitourinary:  No dysuria, urinary retention or frequency.  No nocturia. Musculoskeletal:  No neck pain, back pain Integumentary: No rash, pruritus, skin lesions Neurological: as  above Psychiatric: No depression at this time.  No anxiety Endocrine: No palpitations, diaphoresis, change in appetite, change in weigh or increased thirst Hematologic/Lymphatic:  No anemia, purpura, petechiae. Allergic/Immunologic: No itchy/runny eyes, nasal congestion, recent allergic reactions, rashes  ALLERGIES: No Known Allergies  HOME MEDICATIONS:  Current Outpatient Medications:    amphetamine-dextroamphetamine (ADDERALL) 20 MG tablet, Take 1 tablet (20 mg total) by mouth daily. (Patient taking differently: Take 20 mg by mouth 2 (two) times daily.), Disp: 30 tablet, Rfl: 0   buPROPion (WELLBUTRIN SR) 150 MG 12 hr tablet, Take 150 mg by mouth 2 (two) times daily., Disp: , Rfl:    cephALEXin (KEFLEX) 500 MG capsule, Take 1 capsule (500 mg total) by mouth 4 (four) times daily., Disp: 20 capsule, Rfl: 0   finasteride (PROSCAR) 5 MG tablet, Take 5 mg by mouth daily., Disp: , Rfl:    gabapentin (NEURONTIN) 300 MG capsule, Take 1 capsule (300 mg total) by mouth at bedtime. (Patient taking differently: Take 600 mg by mouth 2 (two) times daily.), Disp: 30 capsule, Rfl: 0   HYDROcodone-acetaminophen (NORCO) 10-325 MG tablet, Take 1 tablet by mouth 4 (four) times daily as needed for pain., Disp: , Rfl:    Ofatumumab (KESIMPTA) 20 MG/0.4ML SOAJ, Inject 20 mg into the skin every 28 (twenty-eight) days., Disp: , Rfl:    oxybutynin (DITROPAN-XL) 5 MG 24 hr tablet, Take 1 tablet (5 mg total) by mouth at bedtime. (Patient not taking: Reported on 02/28/2021), Disp: 30 tablet, Rfl: 0   oxyCODONE (OXY IR/ROXICODONE) 5 MG immediate release tablet, Take 1-2 tablets (5-10 mg total) by mouth every 4 (four) hours as needed for moderate pain. (Patient not taking: No sig reported), Disp: 30 tablet, Rfl: 0  Oxycodone HCl 10 MG TABS, Take 1 tablet (10 mg total) by mouth 3 (three) times daily. (Patient not taking: No sig reported), Disp: 90 tablet, Rfl: 0   sildenafil (REVATIO) 20 MG tablet, Take 1-2tabs prior to  activity as needed. 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   Spinal headache    woke up during procedure    PAST SURGICAL HISTORY: Past Surgical History:  Procedure Laterality Date   BACK SURGERY     EPIDURAL BLOOD PATCH  ~ 11//2018   "for CSF leak"   LITHOTRIPSY     LUMBAR LAMINECTOMY  07/24/2017   "took piece off vertebrae; had a bulging herniated disc"   LUMBAR WOUND DEBRIDEMENT N/A 09/07/2017   Procedure: Lumbar wound exploration with placement of lumbar drain;  Surgeon: Ditty, Loura Halt, MD;  Location: [facility] OR;  Service: Neurosurgery;  Laterality: N/A;   PLACEMENT OF LUMBAR DRAIN N/A 09/07/2017   Procedure: PLACEMENT OF LUMBAR DRAIN;  Surgeon: Ditty, Loura Halt, MD;  Location: [facility] OR;  Service: Neurosurgery;  Laterality: N/A;   REPAIR OF CEREBROSPINAL FLUID LEAK N/A 08/30/2017   Procedure: 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;  Surgeon: Ditty, Loura Halt, MD;  Location: [facility] OR;  Service: Neurosurgery;  Laterality: N/A;  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   WOUND EXPLORATION  08/30/2017   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/notes 08/30/2017    FAMILY HISTORY: Family History  Problem Relation Age of Onset   Hypertension Father    Alcohol abuse Paternal Grandfather     SOCIAL HISTORY:  Social History   Socioeconomic History   Marital status: Divorced    Spouse name: Not on file   Number of children: 3   Years of education: GED    Highest education level: Not on file  Occupational History   Occupation: Disability     Comment: MS  Tobacco Use   Smoking status: Every Day    Packs/day: 1.00    Years: 30.00    Total pack years: 30.00    Types: Cigars, Cigarettes   Smokeless tobacco: Never  Vaping Use   Vaping Use: Never used  Substance and Sexual Activity   Alcohol use: No   Drug use: Yes    Types: Marijuana    Comment: 08/30/2017 "couple times/day"   Sexual activity: Yes  Other Topics Concern   Not on file  Social History Narrative   Fun: Draw (anything), realism.   Denies religious beliefs effecting health care.    Social Determinants of Health   Financial Resource Strain: Not on file  Food Insecurity: Not on file  Transportation Needs: Not on file  Physical Activity: Not on file  Stress: Not on file  Social Connections: Not on file  Intimate Partner Violence: Not on file     PHYSICAL EXAM  There were no vitals filed for this visit.  There is no height or weight on file to calculate BMI.  General: The patient is well-developed and well-nourished and in no acute distress  HEENT:  Head is [place]/AT.  Sclera are anicteric.  Funduscopic exam shows normal optic discs and retinal vessels.  Neck: No carotid bruits are noted.  The neck is nontender.  Cardiovascular: The heart has a regular rate and rhythm with a normal S1 and S2. There were no murmurs, gallops or rubs.    Skin: Extremities are without rash or  edema.  Musculoskeletal:  Back is nontender  Neurologic Exam  Mental status: The patient is alert and oriented x 3 at the time of the examination. The patient has apparent normal recent and remote memory, with an apparently normal attention span and concentration ability.   Speech is normal.  Cranial nerves: Extraocular movements are full. Pupils are equal, round, and reactive to light and accomodation.  Visual fields are full.  Facial symmetry is present. There is good facial sensation to  soft touch bilaterally.Facial strength is normal.  Trapezius and sternocleidomastoid strength is normal. No dysarthria is noted.  The tongue is midline, and the patient has symmetric elevation of the soft palate. No obvious hearing deficits are noted.  Motor:  Muscle bulk is normal.   Tone is normal. Strength is  5 / 5 in all 4 extremities.   Sensory: Sensory testing is intact to pinprick, soft touch and vibration sensation in all 4 extremities.  Coordination: Cerebellar testing reveals good finger-nose-finger and heel-to-shin bilaterally.  Gait and station: Station is normal.   Gait is normal. Tandem gait is normal. Romberg is negative.   Reflexes: Deep tendon reflexes are symmetric and normal bilaterally.   Plantar responses are flexor.    DIAGNOSTIC DATA (LABS, IMAGING, TESTING) - I reviewed patient records, labs, notes, testing and imaging myself where available.  Lab Results  Component Value Date   WBC 8.1 03/24/2022   HGB 14.2 03/24/2022   HCT 41.6 03/24/2022   MCV 95.6 03/24/2022   PLT 288 03/24/2022      Component Value Date/Time   NA 138 03/24/2022 2205   K 3.8 03/24/2022 2205   CL 105 03/24/2022 2205   CO2 24 03/24/2022 2205   GLUCOSE 120 (H) 03/24/2022 2205   BUN 15 03/24/2022 2205   CREATININE 1.11 03/24/2022 2205   CALCIUM 8.9 03/24/2022 2205   PROT 7.3 02/04/2016 0958   ALBUMIN 4.3 02/04/2016 0958   AST 20 02/04/2016 0958   ALT 27 02/04/2016 0958   ALKPHOS 81 02/04/2016 0958   BILITOT 0.6 02/04/2016 0958   GFRNONAA >60 03/24/2022 2205   GFRAA >60 08/31/2017 0329   No results found for: "CHOL", "HDL", "LDLCALC", "LDLDIRECT", "TRIG", "CHOLHDL" No results found for: "HGBA1C" No results found for: "VITAMINB12" No results found for: "TSH"     ASSESSMENT AND PLAN  ***   Gabriel Burns A. Epimenio Foot, MD, [facility] 05/19/2022, 8:54 PM Certified in Neurology, Clinical Neurophysiology, Sleep Medicine and Neuroimaging  [facility] [address]  [address] [place], [place] [address] [phone]

## 2022-05-20 ENCOUNTER — Ambulatory Visit: Payer: Medicare Other | Admitting: Neurology

## 2022-05-20 ENCOUNTER — Encounter: Payer: Self-pay | Admitting: Neurology

## 2022-06-08 ENCOUNTER — Ambulatory Visit: Payer: Medicare Other | Admitting: Neurology

## 2022-06-21 ENCOUNTER — Encounter (HOSPITAL_COMMUNITY): Payer: Self-pay | Admitting: Emergency Medicine

## 2022-06-21 ENCOUNTER — Emergency Department (HOSPITAL_COMMUNITY)
Admission: EM | Admit: 2022-06-21 | Discharge: 2022-06-21 | Discharge status: Against Medical Advice | Payer: 59 | Attending: Emergency Medicine | Admitting: Emergency Medicine

## 2022-06-21 ENCOUNTER — Other Ambulatory Visit: Payer: Self-pay

## 2022-06-21 DIAGNOSIS — M7918 Myalgia, other site: Secondary | ICD-10-CM | POA: Diagnosis present

## 2022-06-21 DIAGNOSIS — R5383 Other fatigue: Secondary | ICD-10-CM | POA: Diagnosis not present

## 2022-06-21 DIAGNOSIS — R531 Weakness: Secondary | ICD-10-CM | POA: Insufficient documentation

## 2022-06-21 DIAGNOSIS — J449 Chronic obstructive pulmonary disease, unspecified: Secondary | ICD-10-CM | POA: Diagnosis not present

## 2022-06-21 DIAGNOSIS — Z5321 Procedure and treatment not carried out due to patient leaving prior to being seen by health care provider: Secondary | ICD-10-CM | POA: Diagnosis not present

## 2022-06-21 NOTE — ED Triage Notes (Signed)
Patient reports MS flare up onset last week with generalized body aches /fatigue and muscles tightness. Respirations unlabored , denies fever or chills .

## 2022-06-21 NOTE — ED Provider Triage Note (Signed)
Emergency Medicine Provider Triage Evaluation Note  Gabriel Burns , a 51 y.o. male  was evaluated in triage.  Pt has history of multiple sclerosis, COPD who presents to the emergency department for evaluation suspected MS flare that started about 1 week ago.  Patient went to visit his PCP to discuss symptoms of depression.  While he was there, he mention generalized body aches with fatigue, lower extremity muscle weakness and "surges" that shoot down his legs.  His sleep he became concerned about a possible multiple sclerosis flare of any symptoms in the emergency department.  Patient is at baseline ambulatory with his cane still able to walk around at home.  He denies headache, vision changes.  Review of Systems  Positive:  Negative:   Physical Exam  BP (!) 146/106 (BP Location: Left Arm)   Pulse (!) 104   Temp 98.9 F (37.2 C) (Oral)   Resp 16   SpO2 97%  Gen:   Awake, no distress   Resp:  Normal effort  MSK:   Moves extremities without difficulty  Other:    Medical Decision Making  Medically screening exam initiated at 7:18 PM.  Appropriate orders placed.  Gabriel Burns was informed that the remainder of the evaluation will be completed by another provider, this initial triage assessment does not replace that evaluation, and the importance of remaining in the ED until their evaluation is complete.     Janell Quiet, [place] 06/21/22 1921

## 2022-06-23 ENCOUNTER — Other Ambulatory Visit: Payer: Self-pay | Admitting: Family Medicine

## 2022-06-23 DIAGNOSIS — F322 Major depressive disorder, single episode, severe without psychotic features: Secondary | ICD-10-CM

## 2022-06-24 ENCOUNTER — Encounter: Payer: Self-pay | Admitting: Neurology

## 2022-06-24 ENCOUNTER — Telehealth: Payer: Self-pay | Admitting: Neurology

## 2022-06-24 ENCOUNTER — Ambulatory Visit (INDEPENDENT_AMBULATORY_CARE_PROVIDER_SITE_OTHER): Payer: 59 | Admitting: Neurology

## 2022-06-24 VITALS — BP 170/103 | HR 109 | Ht 70.0 in | Wt 169.8 lb

## 2022-06-24 DIAGNOSIS — Z79899 Other long term (current) drug therapy: Secondary | ICD-10-CM | POA: Diagnosis not present

## 2022-06-24 DIAGNOSIS — R269 Unspecified abnormalities of gait and mobility: Secondary | ICD-10-CM | POA: Diagnosis not present

## 2022-06-24 DIAGNOSIS — F3342 Major depressive disorder, recurrent, in full remission: Secondary | ICD-10-CM

## 2022-06-24 DIAGNOSIS — G35 Multiple sclerosis: Secondary | ICD-10-CM

## 2022-06-24 DIAGNOSIS — E559 Vitamin D deficiency, unspecified: Secondary | ICD-10-CM

## 2022-06-24 DIAGNOSIS — F988 Other specified behavioral and emotional disorders with onset usually occurring in childhood and adolescence: Secondary | ICD-10-CM

## 2022-06-24 MED ORDER — AMPHETAMINE-DEXTROAMPHETAMINE 20 MG PO TABS: 20.0000 mg | ORAL_TABLET | Freq: Two times a day (BID) | ORAL | 0 refills | Status: DC

## 2022-06-24 MED ORDER — BACLOFEN 10 MG PO TABS: 10.0000 mg | ORAL_TABLET | Freq: Three times a day (TID) | ORAL | 5 refills | Status: DC

## 2022-06-24 NOTE — Progress Notes (Signed)
[facility]  PATIENT: Gabriel Burns DOB: Dec 06, 1970  REFERRING DOCTOR OR PCP: Fatima Sanger, FNP SOURCE: Patient, notes from primary care, notes from [facility] neurology, imaging and lab reports, MRI images personally reviewed.  _________________________________   HISTORICAL  CHIEF COMPLAINT:  Chief Complaint  Patient presents with   Follow-up    RM 31 with girl friend Gabriel Burns  Pt is well, has been diagnosed with MS since 2010     HISTORY OF PRESENT ILLNESS:  I had the pleasure of seeing patient, Gabriel Burns, at the [facility] at [facility] for neurologic consultation regarding his multiple sclerosis.  He is a 51 year old man who was diagnosed with MS in 2010.  At the time he had right leg weakness and poor gait.    He had milder weakness on the right arm.   He saw Dr. Ronalee Red and then Dr. Elwyn Reach.  He was placed on Copaxone.  He had more exacerbations with weakness and phasic spasms in his legs.   He had a lot of steroid treatments and was switched to Tysabri.   He didn't think he did well on it.  A couple years ago he went on Kesimpta but had some progression.   He has not been on it nearly two years.     He has been seen at [facility] most recently.  (Dr. Elwyn Reach and Dr. Ronalee Red).     Currently, he has right > left, leg > arm weakness   He hasright > left spasticity.  He had been prescribed baclofen but never took it.  Gait is poor.    He has an right AFO and uses a cane.   He can only go about 100-150 feet without a break.   He has dysesthetic pain in right > left leg and the right arm.   He has pixilated vision' if he is hot or walks longer.  He needs reading glasses.    He has urinary urgency and occasional urge incontinence and occasional fecal incontinence (with urge first).   He has ED.Marland Kitchen    He has fatigue, much worse with heat.   He gets so tired in heat that he can't rise from the ground without help.   He sleeps poorly at night with  difficulty falling and staying asleep.    No naps.   No EDS.    He snores some but no OSA signs.    He has depression.   He is on venlafaxine and buproprion.    He has difficulty with completion. He starts projects but never finishes.  He has reduced memory, helped with hints  He has back pain and pain that radiates down the leg.  He has a history of hepatitis C that was treated.  PCR 11/08/2019 was negative.   Imaging personally reviewed:  MRI of the thoracic spine 02/16/2016 shows T2 hyperintense foci at T6-T7, T8-T9 and T11-T12.  Mild degenerative changes but no spinal stenosis  MRI of the cervical spine 02/13/2016 shows multiple T2/FLAIR hyperintense foci with discrete lesions anteriorly adjacent to C2-C3, to the right adjacent to C3, anterior centrally adjacent to C5, posteriorly adjacent to C6,  Of the brain 02/13/2016 and 07/04/2014 shows multiple T2/FLAIR hyperintense foci.  There are confluencies in the periatrial white matter.  Some of the foci are radially oriented to the ventricles.  Some foci are in the juxtacortical white matter.  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  No definite infratentorial foci were  noted..  No enhancing lesions.  Possibly some progression of the periatrial white matter foci between 2015 and 201 (or just due to differences in technique).  MRI of the lumbar spine 05/26/2017 shows disc bulging and borderline spinal stenosis at L3-L4 with mild foraminal and lateral recess stenosis but no nerve root compression.  At L4-L5, he has a right paramedian disc protrusion with moderate foraminal narrowing right greater than left moderately severe lateral recess stenosis.  Laboratory: Oligoclonal bands 02/15/2012 were positive at 12. Hepatitis C PCR was positive in 2020 and negative in 2021 after treatment. 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  HIV was negative.  REVIEW OF SYSTEMS: Constitutional: No fevers, chills, sweats, or change in appetite.  He has  fatigue Eyes: No visual changes, double vision, eye pain Ear, nose and throat: No hearing loss, ear pain, nasal congestion, sore throat Cardiovascular: No chest pain, palpitations Respiratory:  No shortness of breath at rest or with exertion.   No wheezes GastrointestinaI: No nausea, vomiting, diarrhea, abdominal pain, fecal incontinence Genitourinary:  No dysuria, urinary retention or frequency.  No nocturia. Musculoskeletal: He has back pain and some radiating pain down the leg Integumentary: No rash, pruritus, skin lesions Neurological: as above Psychiatric: No depression at this time.  No anxiety Endocrine: No palpitations, diaphoresis, change in appetite, change in weigh or increased thirst Hematologic/Lymphatic:  No anemia, purpura, petechiae. Allergic/Immunologic: No itchy/runny eyes, nasal congestion, recent allergic reactions, rashes  ALLERGIES: No Known Allergies  HOME MEDICATIONS:  Current Outpatient Medications:    amphetamine-dextroamphetamine (ADDERALL) 20 MG tablet, Take 1 tablet (20 mg total) by mouth daily. (Patient taking differently: Take 20 mg by mouth 2 (two) times daily.), Disp: 30 tablet, Rfl: 0   cephALEXin (KEFLEX) 500 MG capsule, Take 1 capsule (500 mg total) by mouth 4 (four) times daily., Disp: 20 capsule, Rfl: 0   finasteride (PROSCAR) 5 MG tablet, Take 5 mg by mouth daily., Disp: , Rfl:    HYDROcodone-acetaminophen (NORCO) 10-325 MG tablet, Take 1 tablet by mouth 4 (four) times daily as needed for pain., Disp: , Rfl:    Ofatumumab (KESIMPTA) 20 MG/0.4ML SOAJ, Inject 20 mg into the skin every 28 (twenty-eight) days., Disp: , Rfl:    oxybutynin (DITROPAN-XL) 5 MG 24 hr tablet, Take 1 tablet (5 mg total) by mouth at bedtime., Disp: 30 tablet, Rfl: 0   oxyCODONE (OXY IR/ROXICODONE) 5 MG immediate release tablet, Take 1-2 tablets (5-10 mg total) by mouth every 4 (four) hours as needed for moderate pain., Disp: 30 tablet, Rfl: 0   Oxycodone HCl 10 MG TABS, Take 1  tablet (10 mg total) by mouth 3 (three) times daily., Disp: 90 tablet, Rfl: 0   sildenafil (REVATIO) 20 MG tablet, Take 1-2tabs prior to activity as needed. (Patient taking differently: Take 20-40 mg by mouth daily as needed (Prior to sexual activity).), Disp: 15 tablet, Rfl: 0   buPROPion (WELLBUTRIN SR) 150 MG 12 hr tablet, Take 150 mg by mouth 2 (two) times daily. (Patient not taking: Reported on 06/24/2022), Disp: , Rfl:    gabapentin (NEURONTIN) 300 MG capsule, Take 1 capsule (300 mg total) by mouth at bedtime. (Patient not taking: Reported on 06/24/2022), Disp: 30 capsule, Rfl: 0   venlafaxine XR (EFFEXOR-XR) 150 MG 24 hr capsule, Take 150 mg by mouth daily. 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   Spinal headache    woke up during procedure    PAST SURGICAL HISTORY: Past Surgical History:  Procedure Laterality Date   BACK SURGERY     EPIDURAL BLOOD PATCH  ~ 11//2018   "for CSF leak"   LITHOTRIPSY     LUMBAR LAMINECTOMY  07/24/2017   "took piece off vertebrae; had a bulging herniated disc"   LUMBAR WOUND DEBRIDEMENT N/A 09/07/2017   Procedure: Lumbar wound exploration with placement of lumbar drain;  Surgeon: Ditty, Loura Halt, MD;  Location: [facility] OR;  Service: Neurosurgery;  Laterality: N/A;   PLACEMENT OF LUMBAR DRAIN N/A 09/07/2017   Procedure: PLACEMENT OF LUMBAR DRAIN;  Surgeon: Ditty, Loura Halt, MD;  Location: [facility] OR;  Service: Neurosurgery;  Laterality: N/A;   REPAIR OF CEREBROSPINAL FLUID LEAK N/A 08/30/2017   Procedure: 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;  Surgeon: Ditty, Loura Halt, MD;  Location: [facility] OR;  Service: Neurosurgery;   Laterality: N/A;  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   WOUND EXPLORATION  08/30/2017   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/notes 08/30/2017    FAMILY HISTORY: Family History  Problem Relation Age of Onset   Hypertension Father    Alcohol abuse Paternal Grandfather     SOCIAL HISTORY: Social History   Socioeconomic History   Marital status: Divorced    Spouse name: Not on file   Number of children: 3   Years of education: GED   Highest education level: Not on file  Occupational History   Occupation: Disability     Comment: MS  Tobacco Use   Smoking status: Every Day    Packs/day: 1.00    Years: 30.00    Total pack years: 30.00    Types: Cigars, Cigarettes   Smokeless tobacco: Never  Vaping Use   Vaping Use: Never used  Substance and Sexual Activity   Alcohol use: No   Drug use: Yes    Types: Marijuana    Comment: 08/30/2017 "couple times/day"   Sexual activity: Yes  Other Topics Concern   Not on file  Social History Narrative   Fun: Draw (anything), realism.   Denies religious beliefs effecting health care.    Social Determinants of Health   Financial Resource Strain: Not on file  Food Insecurity: Not on file  Transportation Needs: Not on file  Physical Activity: Not on file  Stress: Not on file  Social Connections: Not on file  Intimate Partner Violence: Not on file       PHYSICAL EXAM  Vitals:   06/24/22 1328  BP: (!) 170/103  Pulse: (!) 109  Weight: 169 lb 12.1 oz (77 kg)  Height: 5\' 10"  (1.778 m)    Body mass index is 24.36 kg/m.   General: The patient is well-developed and well-nourished and in no acute distress  HEENT:  Head is [place]/AT.  Sclera are anicteric.  Funduscopic exam shows normal optic discs and retinal vessels.  Neck: No carotid bruits are noted.  The neck is nontender.  Cardiovascular: The heart has a regular rate and rhythm with a normal S1 and S2. There were no murmurs,  gallops or rubs.    Skin: Extremities are without rash or  edema.  Musculoskeletal:  Back is tender.   Neurologic Exam  Mental status: The patient  is alert and oriented x 3 at the time of the examination. The patient has apparent normal recent and remote memory, with an apparently normal attention span and concentration ability.   Speech is normal.  Cranial nerves: He has a right INO (partial).  Pupils are equal, round, and reactive to light and accomodation.  Colors are symmetric..  Facial symmetry is present. There is good facial sensation to soft touch bilaterally.Facial strength is normal.  Trapezius and sternocleidomastoid strength is normal. No dysarthria is noted.  The tongue is midline, and the patient has symmetric elevation of the soft palate. No obvious hearing deficits are noted.  Motor:  Muscle bulk is normal.   Tone is increased in right > left leg and in right arm. Strength is  5 / 5 in left arm 4/5 right arm and left leg and 3/5 proximal right leg and 2/5 distal leg.   Reduced right RAM  Sensory: Reduced vibration in right  leg.  Coordination: Cerebellar testing reveals good finger-nose-finger and heel-to-shin bilaterally.  Gait and station: Station is normal.   Spastic gait with right leg drop.   Cannot tandem. Romberg is negative.   Reflexes: Deep tendon reflexes are increased in rght arm and both legs, right > left.   Nonsustained right anle clonus.  Spread at right knee. .   Plantar responses are flexor.    DIAGNOSTIC DATA (LABS, IMAGING, TESTING) - I reviewed patient records, labs, notes, testing and imaging myself where available.  Lab Results  Component Value Date   WBC 8.1 03/24/2022   HGB 14.2 03/24/2022   HCT 41.6 03/24/2022   MCV 95.6 03/24/2022   PLT 288 03/24/2022      Component Value Date/Time   NA 138 03/24/2022 2205   K 3.8 03/24/2022 2205   CL 105 03/24/2022 2205   CO2 24 03/24/2022 2205   GLUCOSE 120 (H) 03/24/2022 2205   BUN 15 03/24/2022  2205   CREATININE 1.11 03/24/2022 2205   CALCIUM 8.9 03/24/2022 2205   PROT 7.3 02/04/2016 0958   ALBUMIN 4.3 02/04/2016 0958   AST 20 02/04/2016 0958   ALT 27 02/04/2016 0958   ALKPHOS 81 02/04/2016 0958   BILITOT 0.6 02/04/2016 0958   GFRNONAA >60 03/24/2022 2205   GFRAA >60 08/31/2017 0329        ASSESSMENT AND PLAN  MS (multiple sclerosis) (HCC) - Plan: MR BRAIN W WO CONTRAST, MR CERVICAL SPINE W WO CONTRAST, Hepatitis B surface antigen, HIV Antibody (routine testing w rflx), IgG, IgA, IgM, QuantiFERON-TB Gold Plus, Stratify JCV Antibody Test ([facility]), Varicella zoster antibody, IgG, Hepatitis B core antibody, total, Hepatitis C antibody, Hepatitis B surface antibody,qualitative, CBC with Differential/Platelet, Comprehensive metabolic panel, amphetamine-dextroamphetamine (ADDERALL) 20 MG tablet, DISCONTINUED: amphetamine-dextroamphetamine (ADDERALL) 20 MG tablet  Recurrent major depressive disorder, in full remission (HCC)  Gait disturbance - Plan: MR BRAIN W WO CONTRAST, MR CERVICAL SPINE W WO CONTRAST  High risk medication use - Plan: Hepatitis B surface antigen, HIV Antibody (routine testing w rflx), IgG, IgA, IgM, QuantiFERON-TB Gold Plus, Stratify JCV Antibody Test ([facility]), Varicella zoster antibody, IgG, Hepatitis B core antibody, total, Hepatitis C antibody, Hepatitis B surface antibody,qualitative, CBC with Differential/Platelet, Comprehensive metabolic panel  Vitamin D deficiency - Plan: VITAMIN D 25 Hydroxy (Vit-D Deficiency, Fractures)  In summary, Mr. Gabriel Burns is a 51 year old man with an active form of secondary progressive multiple sclerosis.  He has not been on any medication for the last year and had previously been on Kesimpta.  Before  that he was on Copaxone and then Tysabri.  We discussed disease modifying therapies.  Due to his extensive plaque burden in the spinal cord, I feel we need to have him on a highly effective medication.  Therefore, I would like him to  start either Ocrevus or Briumvi.  We will check lab work for these to rule out chronic infections.  Additionally, to determine the aggressiveness of his current MS and to be baseline we will check MRI of the brain and cervical spine.  To help with the spasticity I will have him start baclofen and have him titrate to 10 mg 3 times daily.  We might increase this further based on response.  Additionally, I will send in a prescription for Adderall which has been helpful for his MS related ADD and fatigue.  Hopefully we will be able to start his disease modifying therapy in a couple weeks.  He will return to see me in 4 months or sooner if there are new or worsening neurologic symptoms.  Thank you for asking to see Mr. Shinault  Please let me know if I can be of further assistance with him or other patients in the future.   80-minute office visit with the majority of the time spent face-to-face for history and physical, discussion/counseling and decision-making.  Additional time with record review and documentation.    Zaelyn Barbary A. Epimenio Foot, MD, [facility] 06/24/2022, 1:43 PM Certified in Neurology, Clinical Neurophysiology, Sleep Medicine and Neuroimaging  [facility] [address] [place], [place] [address] [phone]

## 2022-06-24 NOTE — Telephone Encounter (Signed)
Placed JCV lab in [facility] lock box for routine lab pick up. Results pending. 

## 2022-06-29 LAB — COMPREHENSIVE METABOLIC PANEL
ALT: 12 IU/L (ref 0–44)
AST: 20 IU/L (ref 0–40)
Albumin/Globulin Ratio: 1.7 (ref 1.2–2.2)
Albumin: 4.7 g/dL (ref 4.1–5.1)
Alkaline Phosphatase: 119 IU/L (ref 44–121)
BUN/Creatinine Ratio: 11 (ref 9–20)
BUN: 11 mg/dL (ref 6–24)
Bilirubin Total: 0.5 mg/dL (ref 0.0–1.2)
CO2: 25 mmol/L (ref 20–29)
Calcium: 9.6 mg/dL (ref 8.7–10.2)
Chloride: 101 mmol/L (ref 96–106)
Creatinine, Ser: 1.02 mg/dL (ref 0.76–1.27)
Globulin, Total: 2.7 g/dL (ref 1.5–4.5)
Glucose: 93 mg/dL (ref 70–99)
Potassium: 4.4 mmol/L (ref 3.5–5.2)
Sodium: 141 mmol/L (ref 134–144)
Total Protein: 7.4 g/dL (ref 6.0–8.5)
eGFR: 90 mL/min/{1.73_m2} (ref >59)

## 2022-06-29 LAB — CBC WITH DIFFERENTIAL/PLATELET
Basophils Absolute: 0.1 10*3/uL (ref 0.0–0.2)
Basos: 1 %
EOS (ABSOLUTE): 0.2 10*3/uL (ref 0.0–0.4)
Eos: 3 %
Hematocrit: 42.1 % (ref 37.5–51.0)
Hemoglobin: 14.2 g/dL (ref 13.0–17.7)
Immature Grans (Abs): 0 10*3/uL (ref 0.0–0.1)
Immature Granulocytes: 0 %
Lymphocytes Absolute: 1.8 10*3/uL (ref 0.7–3.1)
Lymphs: 29 %
MCH: 31.8 pg (ref 26.6–33.0)
MCHC: 33.7 g/dL (ref 31.5–35.7)
MCV: 94 fL (ref 79–97)
Monocytes Absolute: 0.7 10*3/uL (ref 0.1–0.9)
Monocytes: 11 %
Neutrophils Absolute: 3.4 10*3/uL (ref 1.4–7.0)
Neutrophils: 56 %
Platelets: 323 10*3/uL (ref 150–450)
RBC: 4.46 x10E6/uL (ref 4.14–5.80)
RDW: 12.9 % (ref 11.6–15.4)
WBC: 6.1 10*3/uL (ref 3.4–10.8)

## 2022-06-29 LAB — QUANTIFERON-TB GOLD PLUS
QuantiFERON Mitogen Value: >10 IU/mL
QuantiFERON Nil Value: 0.11 IU/mL
QuantiFERON TB1 Ag Value: 1.48 IU/mL
QuantiFERON TB2 Ag Value: 1.46 IU/mL
QuantiFERON-TB Gold Plus: POSITIVE — AB

## 2022-06-29 LAB — HEPATITIS B SURFACE ANTIGEN: Hepatitis B Surface Ag: NEGATIVE

## 2022-06-29 LAB — HEPATITIS B CORE ANTIBODY, TOTAL: Hep B Core Total Ab: NEGATIVE

## 2022-06-29 LAB — IGG, IGA, IGM
IgA/Immunoglobulin A, Serum: 219 mg/dL (ref 90–386)
IgG (Immunoglobin G), Serum: 968 mg/dL (ref 603–1613)
IgM (Immunoglobulin M), Srm: 278 mg/dL — ABNORMAL HIGH (ref 20–172)

## 2022-06-29 LAB — VITAMIN D 25 HYDROXY (VIT D DEFICIENCY, FRACTURES): Vit D, 25-Hydroxy: 27.7 ng/mL — ABNORMAL LOW (ref 30.0–100.0)

## 2022-06-29 LAB — HIV ANTIBODY (ROUTINE TESTING W REFLEX): HIV Screen 4th Generation wRfx: NONREACTIVE

## 2022-06-29 LAB — HEPATITIS B SURFACE ANTIBODY,QUALITATIVE: Hep B Surface Ab, Qual: NONREACTIVE

## 2022-06-29 LAB — VARICELLA ZOSTER ANTIBODY, IGG: Varicella zoster IgG: 1969 index (ref >165)

## 2022-06-29 LAB — HEPATITIS C ANTIBODY: Hep C Virus Ab: REACTIVE — AB

## 2022-07-01 ENCOUNTER — Telehealth: Payer: Self-pay | Admitting: Neurology

## 2022-07-01 NOTE — Telephone Encounter (Signed)
[facility] NPR case #[id number] [place] medicaid NPR sent to [facility]

## 2022-07-05 ENCOUNTER — Other Ambulatory Visit: Payer: Self-pay | Admitting: Neurology

## 2022-07-05 ENCOUNTER — Telehealth: Payer: Self-pay

## 2022-07-05 DIAGNOSIS — R7612 Nonspecific reaction to cell mediated immunity measurement of gamma interferon antigen response without active tuberculosis: Secondary | ICD-10-CM

## 2022-07-05 DIAGNOSIS — R768 Other specified abnormal immunological findings in serum: Secondary | ICD-10-CM

## 2022-07-05 NOTE — Telephone Encounter (Signed)
I called patient to discuss. No answer, VM is full. Will try again later.

## 2022-07-05 NOTE — Telephone Encounter (Signed)
Patient called me back. I discussed his lab results and recommendations. He is getting his MRIs on 9/29 at [facility] and will try to get his CXR then too. He will stop by a [facility] to get Hep C RNA testing and repeat TB testing completed. I will check on him next week. Pt verbalized understanding.

## 2022-07-05 NOTE — Telephone Encounter (Signed)
-----   Message from Britt Bottom, MD sent at 07/05/2022  3:34 PM EDT ----- His QuantiFERON TB test was positive.  I would like to repeat this at a [place] center The hepatitis C antibodies were positive.  He has a history of hep C.  I have ordered the hepatitis C RNA and that can be done at the [facility] center as well.  Because of the positive TB test we need to check a chest x-ray and I placed that for [facility] imaging.  Once she has these we can decide if he can go on Ocrevus

## 2022-07-05 NOTE — Telephone Encounter (Signed)
JCV ab drawn 06/24/2022 positive, index: 1.48

## 2022-07-09 ENCOUNTER — Other Ambulatory Visit: Payer: 59

## 2022-07-22 ENCOUNTER — Inpatient Hospital Stay: Admission: RE | Admit: 2022-07-22 | Payer: 59 | Source: Ambulatory Visit

## 2022-07-22 ENCOUNTER — Other Ambulatory Visit: Payer: 59

## 2022-07-23 ENCOUNTER — Telehealth: Payer: Self-pay | Admitting: Neurology

## 2022-07-23 DIAGNOSIS — G35 Multiple sclerosis: Secondary | ICD-10-CM

## 2022-07-23 MED ORDER — AMPHETAMINE-DEXTROAMPHETAMINE 20 MG PO TABS: 20.0000 mg | ORAL_TABLET | Freq: Two times a day (BID) | ORAL | 0 refills | Status: DC

## 2022-07-23 NOTE — Telephone Encounter (Signed)
Patient requested refill on Adderall. Last refill was 1 mo ago, will renew on behalf of Dr. Felecia Shelling.

## 2022-07-27 ENCOUNTER — Emergency Department (HOSPITAL_COMMUNITY): Payer: 59

## 2022-07-27 ENCOUNTER — Inpatient Hospital Stay (HOSPITAL_COMMUNITY)
Admission: EM | Admit: 2022-07-27 | Discharge: 2022-07-30 | DRG: 200 | Disposition: A | Discharge status: Home / Self Care | Payer: 59 | Attending: General Surgery | Admitting: General Surgery

## 2022-07-27 ENCOUNTER — Encounter (HOSPITAL_COMMUNITY): Payer: Self-pay | Admitting: Emergency Medicine

## 2022-07-27 ENCOUNTER — Other Ambulatory Visit: Payer: Self-pay

## 2022-07-27 DIAGNOSIS — G8929 Other chronic pain: Secondary | ICD-10-CM | POA: Diagnosis present

## 2022-07-27 DIAGNOSIS — F17290 Nicotine dependence, other tobacco product, uncomplicated: Secondary | ICD-10-CM | POA: Diagnosis present

## 2022-07-27 DIAGNOSIS — Y92003 Bedroom of unspecified non-institutional (private) residence as the place of occurrence of the external cause: Secondary | ICD-10-CM

## 2022-07-27 DIAGNOSIS — G35 Multiple sclerosis: Secondary | ICD-10-CM | POA: Diagnosis present

## 2022-07-27 DIAGNOSIS — F32A Depression, unspecified: Secondary | ICD-10-CM | POA: Diagnosis present

## 2022-07-27 DIAGNOSIS — Z8249 Family history of ischemic heart disease and other diseases of the circulatory system: Secondary | ICD-10-CM | POA: Diagnosis not present

## 2022-07-27 DIAGNOSIS — S2241XA Multiple fractures of ribs, right side, initial encounter for closed fracture: Secondary | ICD-10-CM | POA: Diagnosis present

## 2022-07-27 DIAGNOSIS — J939 Pneumothorax, unspecified: Secondary | ICD-10-CM | POA: Diagnosis present

## 2022-07-27 DIAGNOSIS — F17210 Nicotine dependence, cigarettes, uncomplicated: Secondary | ICD-10-CM | POA: Diagnosis present

## 2022-07-27 DIAGNOSIS — S270XXA Traumatic pneumothorax, initial encounter: Principal | ICD-10-CM | POA: Diagnosis present

## 2022-07-27 DIAGNOSIS — Z79620 Long term (current) use of immunosuppressive biologic: Secondary | ICD-10-CM

## 2022-07-27 DIAGNOSIS — W010XXA Fall on same level from slipping, tripping and stumbling without subsequent striking against object, initial encounter: Secondary | ICD-10-CM | POA: Diagnosis present

## 2022-07-27 DIAGNOSIS — Z79891 Long term (current) use of opiate analgesic: Secondary | ICD-10-CM | POA: Diagnosis not present

## 2022-07-27 DIAGNOSIS — Z87442 Personal history of urinary calculi: Secondary | ICD-10-CM | POA: Diagnosis not present

## 2022-07-27 DIAGNOSIS — G35D Multiple sclerosis, unspecified: Secondary | ICD-10-CM | POA: Diagnosis present

## 2022-07-27 LAB — CBC WITH DIFFERENTIAL/PLATELET
Abs Immature Granulocytes: 0.06 10*3/uL (ref 0.00–0.07)
Basophils Absolute: 0 10*3/uL (ref 0.0–0.1)
Basophils Relative: 0 %
Eosinophils Absolute: 0.1 10*3/uL (ref 0.0–0.5)
Eosinophils Relative: 1 %
HCT: 43.3 % (ref 39.0–52.0)
Hemoglobin: 14.6 g/dL (ref 13.0–17.0)
Immature Granulocytes: 1 %
Lymphocytes Relative: 10 %
Lymphs Abs: 1.3 10*3/uL (ref 0.7–4.0)
MCH: 32.6 pg (ref 26.0–34.0)
MCHC: 33.7 g/dL (ref 30.0–36.0)
MCV: 96.7 fL (ref 80.0–100.0)
Monocytes Absolute: 1.1 10*3/uL — ABNORMAL HIGH (ref 0.1–1.0)
Monocytes Relative: 9 %
Neutro Abs: 10 10*3/uL — ABNORMAL HIGH (ref 1.7–7.7)
Neutrophils Relative %: 79 %
Platelets: 278 10*3/uL (ref 150–400)
RBC: 4.48 MIL/uL (ref 4.22–5.81)
RDW: 13.6 % (ref 11.5–15.5)
WBC: 12.6 10*3/uL — ABNORMAL HIGH (ref 4.0–10.5)
nRBC: 0 % (ref 0.0–0.2)

## 2022-07-27 LAB — BASIC METABOLIC PANEL
Anion gap: 9 (ref 5–15)
BUN: 13 mg/dL (ref 6–20)
CO2: 22 mmol/L (ref 22–32)
Calcium: 9.3 mg/dL (ref 8.9–10.3)
Chloride: 107 mmol/L (ref 98–111)
Creatinine, Ser: 0.99 mg/dL (ref 0.61–1.24)
GFR, Estimated: >60 mL/min (ref >60)
Glucose, Bld: 141 mg/dL — ABNORMAL HIGH (ref 70–99)
Potassium: 3.7 mmol/L (ref 3.5–5.1)
Sodium: 138 mmol/L (ref 135–145)

## 2022-07-27 MED ORDER — OXYBUTYNIN CHLORIDE ER 5 MG PO TB24: 5.0000 mg | ORAL_TABLET | Freq: Every day | ORAL | Status: DC

## 2022-07-27 MED ORDER — MORPHINE SULFATE (PF) 4 MG/ML IV SOLN
6.0000 mg | Freq: Once | INTRAVENOUS | Status: AC | PRN
  Administered 2022-07-27: 6 mg via INTRAVENOUS
  Filled 2022-07-27: qty 2

## 2022-07-27 MED ORDER — ONDANSETRON HCL 4 MG/2ML IJ SOLN: 4.0000 mg | Freq: Once | INTRAMUSCULAR | Status: DC | PRN

## 2022-07-27 MED ORDER — KETOROLAC TROMETHAMINE 15 MG/ML IJ SOLN
30.0000 mg | Freq: Four times a day (QID) | INTRAMUSCULAR | Status: DC
  Administered 2022-07-27 – 2022-07-30 (×13): 30 mg via INTRAVENOUS
  Filled 2022-07-27 (×15): qty 2

## 2022-07-27 MED ORDER — ACETAMINOPHEN 500 MG PO TABS
1000.0000 mg | ORAL_TABLET | Freq: Four times a day (QID) | ORAL | Status: DC
  Administered 2022-07-27 – 2022-07-30 (×13): 1000 mg via ORAL
  Filled 2022-07-27 (×13): qty 2

## 2022-07-27 MED ORDER — ONDANSETRON 4 MG PO TBDP: 4.0000 mg | ORAL_TABLET | Freq: Four times a day (QID) | ORAL | Status: DC | PRN

## 2022-07-27 MED ORDER — LACTATED RINGERS IV SOLN: INTRAVENOUS | Status: DC

## 2022-07-27 MED ORDER — OXYCODONE HCL 5 MG PO TABS
5.0000 mg | ORAL_TABLET | ORAL | Status: DC | PRN
  Administered 2022-07-27: 10 mg via ORAL
  Administered 2022-07-27 (×2): 5 mg via ORAL
  Administered 2022-07-28: 10 mg via ORAL
  Filled 2022-07-27 (×2): qty 1
  Filled 2022-07-27 (×2): qty 2

## 2022-07-27 MED ORDER — DOCUSATE SODIUM 100 MG PO CAPS
100.0000 mg | ORAL_CAPSULE | Freq: Two times a day (BID) | ORAL | Status: DC
  Administered 2022-07-27 – 2022-07-30 (×7): 100 mg via ORAL
  Filled 2022-07-27 (×7): qty 1

## 2022-07-27 MED ORDER — METHOCARBAMOL 500 MG PO TABS
1000.0000 mg | ORAL_TABLET | Freq: Three times a day (TID) | ORAL | Status: DC
  Administered 2022-07-27 – 2022-07-30 (×11): 1000 mg via ORAL
  Filled 2022-07-27 (×11): qty 2

## 2022-07-27 MED ORDER — FINASTERIDE 5 MG PO TABS
5.0000 mg | ORAL_TABLET | Freq: Every day | ORAL | Status: DC
  Administered 2022-07-27 – 2022-07-30 (×4): 5 mg via ORAL
  Filled 2022-07-27 (×4): qty 1

## 2022-07-27 MED ORDER — ENOXAPARIN SODIUM 30 MG/0.3ML IJ SOSY
30.0000 mg | PREFILLED_SYRINGE | Freq: Two times a day (BID) | INTRAMUSCULAR | Status: DC
  Administered 2022-07-28 – 2022-07-30 (×5): 30 mg via SUBCUTANEOUS
  Filled 2022-07-27 (×5): qty 0.3

## 2022-07-27 MED ORDER — NICOTINE 21 MG/24HR TD PT24
21.0000 mg | MEDICATED_PATCH | Freq: Every day | TRANSDERMAL | Status: DC
  Administered 2022-07-27 – 2022-07-30 (×4): 21 mg via TRANSDERMAL
  Filled 2022-07-27 (×4): qty 1

## 2022-07-27 MED ORDER — AMPHETAMINE-DEXTROAMPHETAMINE 10 MG PO TABS
20.0000 mg | ORAL_TABLET | Freq: Two times a day (BID) | ORAL | Status: DC
  Administered 2022-07-28 – 2022-07-30 (×3): 20 mg via ORAL
  Filled 2022-07-27 (×5): qty 2

## 2022-07-27 MED ORDER — IOHEXOL 350 MG/ML SOLN
75.0000 mL | Freq: Once | INTRAVENOUS | Status: AC | PRN
  Administered 2022-07-27: 75 mL via INTRAVENOUS

## 2022-07-27 MED ORDER — MORPHINE SULFATE (PF) 4 MG/ML IV SOLN
4.0000 mg | INTRAVENOUS | Status: DC | PRN
  Administered 2022-07-27 – 2022-07-29 (×4): 4 mg via INTRAVENOUS
  Filled 2022-07-27 (×4): qty 1

## 2022-07-27 MED ORDER — ONDANSETRON HCL 4 MG/2ML IJ SOLN: 4.0000 mg | Freq: Four times a day (QID) | INTRAMUSCULAR | Status: DC | PRN

## 2022-07-27 MED ORDER — FENTANYL CITRATE PF 50 MCG/ML IJ SOSY
100.0000 ug | PREFILLED_SYRINGE | Freq: Once | INTRAMUSCULAR | Status: AC
  Administered 2022-07-27: 100 ug via INTRAVENOUS
  Filled 2022-07-27: qty 2

## 2022-07-27 MED ORDER — LIDOCAINE HCL 2 % IJ SOLN
10.0000 mL | Freq: Once | INTRAMUSCULAR | Status: AC
  Administered 2022-07-27: 200 mg via INTRADERMAL
  Filled 2022-07-27: qty 20

## 2022-07-27 NOTE — ED Notes (Signed)
Informed consent completed by Kommor MD and Rondel Oh PA with patient.

## 2022-07-27 NOTE — ED Notes (Signed)
Patient sitting up in chair at bedside.
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
## 2022-07-27 NOTE — Progress Notes (Signed)
2 physician came and connect chest tube to wall suction

## 2022-07-27 NOTE — Progress Notes (Signed)
Trauma Event Note    TRN to bedside to round, pt with tension pneumothorax and 8th rib fx after a ground level fall tripped over his dog. Pigtail chest tube placed earlier today. Found to WS, placed back on wall suction at -20 per orders. Unsure how long patient was on WS. Pt with no complaints at the time of my rounds, alert and resting, denies SHOB. Respirations easy and unlabored. Chest XRAY planned for midnight.  Last imported Vital Signs BP (!) 151/98 (BP Location: Right Arm)   Pulse 80   Temp 98.4 F (36.9 C) (Oral)   Resp 17   SpO2 100%   Trending CBC Recent Labs    07/27/22 0620  WBC 12.6*  HGB 14.6  HCT 43.3  PLT 278    Trending Coag's No results for input(s): "APTT", "INR" in the last 72 hours.  Trending BMET Recent Labs    07/27/22 0620  NA 138  K 3.7  CL 107  CO2 22  BUN 13  CREATININE 0.99  GLUCOSE 141*      Gabriel Burns O Carlea Badour  Trauma Response RN  Please call TRN at [phone] for further assistance.

## 2022-07-27 NOTE — ED Triage Notes (Addendum)
Patient tripped over dog, fell onto right side of chest on the floor and heard a "pop" and has been short of breath since.  No chest pain.  Patient was given 650mg  APAP by EMS.

## 2022-07-27 NOTE — ED Notes (Signed)
Chest tube inserted by Rondel Oh, PA and Kommor, MD.

## 2022-07-27 NOTE — ED Provider Notes (Addendum)
[facility] EMERGENCY DEPARTMENT Provider Note   CSN: [id number] Arrival date & time: 07/27/22  6010     History  Chief Complaint  Patient presents with   Gabriel Burns    Gabriel Burns is a 51 y.o. male.  Patient with history of multiple sclerosis, chronic pain on oxycodone --presents the emergency department for evaluation of right-sided rib pain.  Patient fell approximately 3:30 AM.  States that he was trying to get out of bed when he got tangled up with the dog.  He states that he fell onto his left side ribs.  He felt a crunch.  States that he had a lot of difficulty getting up due to the pain.  He did not hit his head or hurt his neck per his report.  No loss of consciousness.  He states that he called to his 26 year old son at home who was asleep.  EMS was eventually called.  Tylenol administered by EMS.       Home Medications Prior to Admission medications   Medication Sig Start Date End Date Taking? Authorizing Provider  amphetamine-dextroamphetamine (ADDERALL) 20 MG tablet Take 1 tablet (20 mg total) by mouth 2 (two) times daily. 07/23/22   Huston Foley, MD  baclofen (LIORESAL) 10 MG tablet Take 1 tablet (10 mg total) by mouth 3 (three) times daily. 06/24/22   Sater, Pearletha Furl, MD  buPROPion (WELLBUTRIN SR) 150 MG 12 hr tablet Take 150 mg by mouth 2 (two) times daily. Patient not taking: Reported on 06/24/2022 01/06/21   [provider]  cephALEXin (KEFLEX) 500 MG capsule Take 1 capsule (500 mg total) by mouth 4 (four) times daily. 02/28/21   Joy, Shawn C, PA-C  finasteride (PROSCAR) 5 MG tablet Take 5 mg by mouth daily. 01/23/21   [provider]  gabapentin (NEURONTIN) 300 MG capsule Take 1 capsule (300 mg total) by mouth at bedtime. Patient not taking: Reported on 06/24/2022 08/12/17   Nche, Bonna Gains, NP  HYDROcodone-acetaminophen (NORCO) 10-325 MG tablet Take 1 tablet by mouth 4 (four) times daily as needed for pain. 02/23/21   [provider]  Ofatumumab (KESIMPTA) 20 MG/0.4ML SOAJ Inject 20 mg into the skin every 28 (twenty-eight) days. 04/08/20   [provider]  oxybutynin (DITROPAN-XL) 5 MG 24 hr tablet Take 1 tablet (5 mg total) by mouth at bedtime. 08/12/17   Nche, Bonna Gains, NP  oxyCODONE (OXY IR/ROXICODONE) 5 MG immediate release tablet Take 1-2 tablets (5-10 mg total) by mouth every 4 (four) hours as needed for moderate pain. 09/11/17   Tressie Stalker, MD  Oxycodone HCl 10 MG TABS Take 1 tablet (10 mg total) by mouth 3 (three) times daily. 08/12/17   Nche, Bonna Gains, NP  sildenafil (REVATIO) 20 MG tablet Take 1-2tabs prior to activity as needed. Patient taking differently: Take 20-40 mg by mouth daily as needed (Prior to sexual activity). 08/12/17   Nche, Bonna Gains, NP  venlafaxine XR (EFFEXOR-XR) 150 MG 24 hr capsule Take 150 mg by mouth daily. Patient not taking: Reported on 06/24/2022 12/26/20   [provider]      Allergies    Patient has no known allergies.    Review of Systems   Review of Systems  Physical Exam Updated Vital Signs BP (!) 148/98   Pulse 98   Temp 97.6 F (36.4 C) (Oral)   Resp 17   SpO2 97%   Physical Exam Vitals and nursing note reviewed.  Constitutional:  General: He is not in acute distress.    Appearance: He is well-developed.  HENT:     Head: Normocephalic and atraumatic.     Right Ear: External ear normal.     Left Ear: External ear normal.     Nose: Nose normal.     Mouth/Throat:     Mouth: Mucous membranes are moist.  Eyes:     General:        Right eye: No discharge.        Left eye: No discharge.     Conjunctiva/sclera: Conjunctivae normal.  Cardiovascular:     Rate and Rhythm: Regular rhythm. Tachycardia present.     Heart sounds: Normal heart sounds.     Comments: HR 100-105 Pulmonary:     Effort: Pulmonary effort is normal.     Breath sounds: Examination of the right-upper field reveals decreased breath sounds.  Examination of the right-middle field reveals decreased breath sounds. Examination of the right-lower field reveals decreased breath sounds. Decreased breath sounds present. No wheezing, rhonchi or rales.     Comments: R lateral subcutaneous emphysema. Normal respiratory effort.  Chest:     Chest wall: Tenderness and crepitus present. No deformity.     Comments: Tenderness to palpation lateral chest wall below axilla and anteriorly. No deformity noted. No flair segments noted. No ecchymosis noted.  Abdominal:     Palpations: Abdomen is soft.     Tenderness: There is no abdominal tenderness. There is no guarding or rebound.     Comments: No ecchymosis  Musculoskeletal:     Cervical back: Normal range of motion and neck supple. No tenderness. Normal range of motion.     Thoracic back: No tenderness. Normal range of motion.     Lumbar back: No tenderness. Normal range of motion.     Comments: No cervical spine tenderness, no decreased ROM in any of the 6 directions of motion.  Skin:    General: Skin is warm and dry.  Neurological:     Mental Status: He is alert.     ED Results / Procedures / Treatments   Labs (all labs ordered are listed, but only abnormal results are displayed) Labs Reviewed  CBC WITH DIFFERENTIAL/PLATELET - Abnormal; Notable for the following components:      Result Value   WBC 12.6 (*)    Neutro Abs 10.0 (*)    Monocytes Absolute 1.1 (*)    All other components within normal limits  BASIC METABOLIC PANEL    EKG EKG Interpretation  Date/Time:  Tuesday July 27 2022 05:53:37 EDT Ventricular Rate:  102 PR Interval:  114 QRS Duration: 92 QT Interval:  362 QTC Calculation: 471 R Axis:   90 Text Interpretation: Sinus tachycardia Rightward axis Nonspecific T wave abnormality Abnormal ECG No previous ECGs available Confirmed by Zadie Rhine ([id number]) on 07/27/2022 6:22:03 AM  Radiology DG Chest 2 View  Result Date: 07/27/2022 CLINICAL DATA:  51 year old  male with history of trauma from a fall complaining of right-sided rib pain. EXAM: CHEST - 2 VIEW COMPARISON:  Chest x-ray 12/13/2014. FINDINGS: Large right-sided pneumothorax occupying greater than 50% of the volume of the right hemithorax. Left lung is clear. No left pneumothorax. Opacity at the right base likely reflects passive atelectasis in the right middle and lower lobes. Mild blunting of the right costophrenic sulcus may suggest the presence of right-sided pleural fluid as well. No evidence of pulmonary edema. Heart size is normal. There may be slight leftward shift of  cardiomediastinal structures, but this is not dramatic at this time. Multiple old healed right-sided rib fractures are noted. Probable acute minimally displaced fracture of the posterolateral aspect of the right eighth rib. Gas is noted in the right chest wall. IMPRESSION: 1. Probable acute minimally displaced fracture of the posterolateral aspect of the right eighth rib with large right-sided pneumothorax, which is demonstrating evidence suggestive of early tension type pneumothorax. Chest tube placement is advised. Critical Value/emergent results were called by telephone at the time of interpretation on 07/27/2022 at 6:01 am to provider Dr. Christy Gentles, who verbally acknowledged these results. Electronically Signed   By: Vinnie Langton M.D.   On: 07/27/2022 06:02    Procedures CHEST TUBE INSERTION  Date/Time: 07/27/2022 7:43 AM  Performed by: Carlisle Cater, PA-C Authorized by: Carlisle Cater, PA-C   Consent:    Consent obtained:  Verbal and written   Consent given by:  Patient   Risks, benefits, and alternatives were discussed: yes     Risks discussed:  Bleeding, incomplete drainage, nerve damage, pain, infection and damage to surrounding structures   Alternatives discussed:  No treatment Universal protocol:    Procedure explained and questions answered to patient or proxy's satisfaction: yes     Relevant documents present  and verified: yes     Test results available: yes     Imaging studies available: yes     Required blood products, implants, devices, and special equipment available: yes     Site/side marked: yes     Immediately prior to procedure, a time out was called: yes     Patient identity confirmed:  Verbally with patient, arm band and provided demographic data Pre-procedure details:    Skin preparation:  Povidone-iodine   Preparation: Patient was prepped and draped in the usual sterile fashion   Sedation:    Sedation type:  None Anesthesia:    Anesthesia method:  Local infiltration   Local anesthetic:  Lidocaine 2% w/o epi Procedure details:    Placement location:  R lateral   Scalpel size:  11   Tube size ([place]): pigtail.   Ultrasound guidance: no     Tension pneumothorax: no     Tube connected to:  Suction   Drainage characteristics:  Air only   Suture material:  2-0 silk   Dressing:  4x4 sterile gauze Post-procedure details:    Post-insertion x-ray findings: tube in good position     Procedure completion:  Tolerated well, no immediate complications     Medications Ordered in ED Medications  fentaNYL (SUBLIMAZE) injection 100 mcg (100 mcg Intravenous Given 07/27/22 4332)    ED Course/ Medical Decision Making/ A&P    Patient seen and examined. History obtained directly from patient. Work-up including labs, imaging, EKG ordered in triage, if performed, were reviewed.    Labs/EKG: Independently reviewed and interpreted.  This included: CBC with elevated white blood cell count at 12.6, hemoglobin 14.6 otherwise unremarkable; BMP glucose 141 otherwise unremarkable.  Imaging: Independently visualized and interpreted.  This included: Chest x-ray demonstrating R pneumothorax  Medications/Fluids: Ordered: morphine/zofran, lidocaine 2%.   Most recent vital signs reviewed and are as follows: BP (!) 148/98   Pulse 98   Temp 97.6 F (36.4 C) (Oral)   Resp 17   SpO2 97%   Initial  impression: Patient appears comfortable at time of initial exam.  Discussed chest tubes, risks and benefits.  O2 sat 98% on 3 L nasal cannula.  Heart rate 100-105.  Will need chest tube  for decompression.   7:42 AM CP placed by myself under direct supervision of Dr. Audrie Lia. Pt tolerated well.   7:49 AM Dr. Audrie Lia discussed with Remi Haggard in department in regards to trauma admission.    8:07 AM Reviewed post-procedure CXR, agree improvement in R-sided pneumothorax.   Plan. Admit to hospital.   CRITICAL CARE Performed by: Renne Crigler Total critical care time: 45 minutes Critical care time was exclusive of separately billable procedures and treating other patients. Critical care was necessary to treat or prevent imminent or life-threatening deterioration. Critical care was time spent personally by me on the following activities: development of treatment plan with patient and/or surrogate as well as nursing, discussions with consultants, evaluation of patient's response to treatment, examination of patient, obtaining history from patient or surrogate, ordering and performing treatments and interventions, ordering and review of laboratory studies, ordering and review of radiographic studies, pulse oximetry and re-evaluation of patient's condition.                            Medical Decision Making Amount and/or Complexity of Data Reviewed Radiology: ordered.  Risk Prescription drug management. Decision regarding hospitalization.   Patient with mechanical fall onto his ribs, on a bed frame.  No head or neck injury suspected.  He has right-sided pneumo thorax with subcutaneous emphysema.  No clinical signs of tension pneumothorax during ED evaluation.  Chest tube was placed without complications.  Low concern for closed head injury or cervical spine injury.  Low concern for abdominal injury.        Final Clinical Impression(s) / ED Diagnoses Final diagnoses:  Traumatic  pneumothorax, initial encounter  Closed fracture of multiple ribs of right side, initial encounter    Rx / DC Orders ED Discharge Orders     None         Renne Crigler, PA-C 07/27/22 0845    Renne Crigler, PA-C 07/27/22 0934    Glendora Score, MD 07/27/22 [phone]

## 2022-07-27 NOTE — ED Provider Triage Note (Addendum)
Emergency Medicine Provider Triage Evaluation Note  Gabriel Burns , a 51 y.o. male  was evaluated in triage.  Pt complains of right-sided rib pain.  Patient states that he got up this morning out of bed and got tangled up with his dog and fell onto his right side.  With his MS, he states that he did not have the instinct to stick his arms out to stop his fall.  He reports falling onto his right ribs and feeling a pop.  He states that he knows that his ribs are broken but he also wants to make sure that he did not "pop along".  He got some Tylenol with EMS.  Denies any recent MS flares.  He has been compliant with his medications.  Review of Systems  Positive: As above Negative: As above  Physical Exam  BP (!) 153/91 (BP Location: Left Arm)   Pulse (!) 101   Temp 97.6 F (36.4 C) (Oral)   Resp 17   SpO2 96%  Gen:   Awake, no distress   Resp:  Normal effort  MSK:   Moves extremities without difficulty  Other:  Tenderness to palpation to the inferior right rib cage as well as the right chest wall.  No evidence of deformities or flail chest.    Medical Decision Making  Medically screening exam initiated at 5:45 AM. Appropriate orders placed.  GEFFREY MICHAELSEN was informed that the remainder of the evaluation will be completed by another provider, this initial triage assessment does not replace that evaluation, and the importance of remaining in the ED until their evaluation is complete.     Garald Balding, PA-C 07/27/22 [phone]

## 2022-07-27 NOTE — Telephone Encounter (Signed)
It appears that patient is in ER with a traumatic pneumothorax. Unsure if CXR completed in ER will be sufficient to rule out TB. Once patient is discharged, will discuss further.

## 2022-07-27 NOTE — ED Notes (Signed)
Pt placed on 2L [place]

## 2022-07-27 NOTE — Progress Notes (Addendum)
Received chest tube on water seal, clarify with MD Grandville Silos said it should be in continues wall suction.

## 2022-07-27 NOTE — ED Notes (Signed)
ED TO INPATIENT HANDOFF REPORT  ED Nurse Name and Phone #: Dalia Heading RN   S Name/Age/Gender Gabriel Burns 51 y.o. male Room/Bed: 038C/038C  Code Status   Code Status: Full Code  Home/SNF/Other Home Patient oriented to: self, place, time, and situation Is this baseline? Yes   Triage Complete: Triage complete  Chief Complaint Pneumothorax [J93.9]  Triage Note Patient tripped over dog, fell onto right side of chest on the floor and heard a "pop" and has been short of breath since.  No chest pain.  Patient was given 650mg  APAP by EMS.     Allergies No Known Allergies  Level of Care/Admitting Diagnosis ED Disposition     ED Disposition  Admit   Condition  --   Comment  Hospital Area: [facility] [[id number]]  Level of Care: Med-Surg [16]  May admit patient to [facility] or [facility] if equivalent level of care is available:: No  Covid Evaluation: Asymptomatic - no recent exposure (last 10 days) testing not required  Diagnosis: Pneumothorax [id number]  Admitting Physician: TRAUMA MD [[id number]]  Attending Physician: TRAUMA MD [[id number]]  Certification:: I certify there are rare and unusual circumstances requiring inpatient admission  Estimated Length of Stay: 9          B Medical/Surgery History Past Medical History:  Diagnosis Date   Chronic lower back pain    COPD (chronic obstructive pulmonary disease) (HCC)    Depression    History of kidney stones    Migraine    "2-4 times/year" (08/30/2017)   Multiple sclerosis (HCC)    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   Spinal headache    woke up during procedure   Past Surgical History:  Procedure Laterality Date   BACK SURGERY     EPIDURAL BLOOD PATCH  ~ 11//2018   "for CSF leak"   LITHOTRIPSY     LUMBAR LAMINECTOMY  07/24/2017   "took piece off vertebrae; had a bulging herniated disc"   LUMBAR WOUND DEBRIDEMENT N/A 09/07/2017   Procedure: Lumbar wound exploration with placement of lumbar drain;   Surgeon: Ditty, 09/09/2017, MD;  Location: [facility] OR;  Service: Neurosurgery;  Laterality: N/A;   PLACEMENT OF LUMBAR DRAIN N/A 09/07/2017   Procedure: PLACEMENT OF LUMBAR DRAIN;  Surgeon: Ditty, 09/09/2017, MD;  Location: [facility] OR;  Service: Neurosurgery;  Laterality: N/A;   REPAIR OF CEREBROSPINAL FLUID LEAK N/A 08/30/2017   Procedure: 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;  Surgeon: Ditty, 09/01/2017, MD;  Location[facility] OR;  Service: Neurosurgery;  Laterality: N/A;  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   WOUND EXPLORATION  08/30/2017   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/notes 08/30/2017     A IV Location/Drains/Wounds Patient Lines/Drains/Airways Status     Active Line/Drains/Airways     Name Placement date Placement time Site Days   Peripheral IV 07/27/22 20 G Anterior;Distal;Left;Upper Arm 07/27/22  0623  Arm  less than 1   Chest Tube Right Pleural 14 Fr. 07/27/22  0730  Pleural  less than 1   Incision (Closed) 09/07/17 Back 09/07/17  1703  -- 1784   Wound / Incision (Open or Dehisced) 04/19/15 Laceration Other (Comment) Right;Lower 04/19/15  0304  Other (Comment)  2656            Intake/Output Last 24 hours  Intake/Output Summary (Last 24 hours) at 07/27/2022 1707 Last data filed at 07/27/2022 1320 Gross per 24 hour  Intake --  Output 30 ml  Net -30 ml    Labs/Imaging Results for orders placed or performed during the hospital encounter of 07/27/22 (from the past 48 hour(s))  Basic metabolic panel     Status: Abnormal   Collection Time: 07/27/22  6:20 AM  Result Value Ref Range   Sodium 138 135 - 145 mmol/L   Potassium 3.7 3.5 - 5.1 mmol/L   Chloride 107 98 - 111 mmol/L   CO2 22 22 - 32 mmol/L   Glucose, Bld 141 (H) 70 - 99 mg/dL    Comment: Glucose reference range applies only to samples taken after fasting for at least 8 hours.   BUN 13 6 - 20 mg/dL   Creatinine, Ser 1.27 0.61 - 1.24  mg/dL   Calcium 9.3 8.9 - 51.7 mg/dL   GFR, Estimated >00 >17 mL/min    Comment: (NOTE) Calculated using the CKD-EPI Creatinine Equation (2021)    Anion gap 9 5 - 15    Comment: Performed at [facility], [address], [place], [place] [address]  CBC with Differential     Status: Abnormal   Collection Time: 07/27/22  6:20 AM  Result Value Ref Range   WBC 12.6 (H) 4.0 - 10.5 K/uL   RBC 4.48 4.22 - 5.81 MIL/uL   Hemoglobin 14.6 13.0 - 17.0 g/dL   HCT 67.5 91.6 - 38.4 %   MCV 96.7 80.0 - 100.0 fL   MCH 32.6 26.0 - 34.0 pg   MCHC 33.7 30.0 - 36.0 g/dL   RDW 66.5 99.3 - 57.0 %   Platelets 278 150 - 400 K/uL   nRBC 0.0 0.0 - 0.2 %   Neutrophils Relative % 79 %   Neutro Abs 10.0 (H) 1.7 - 7.7 K/uL   Lymphocytes Relative 10 %   Lymphs Abs 1.3 0.7 - 4.0 K/uL   Monocytes Relative 9 %   Monocytes Absolute 1.1 (H) 0.1 - 1.0 K/uL   Eosinophils Relative 1 %   Eosinophils Absolute 0.1 0.0 - 0.5 K/uL   Basophils Relative 0 %   Basophils Absolute 0.0 0.0 - 0.1 K/uL   Immature Granulocytes 1 %   Abs Immature Granulocytes 0.06 0.00 - 0.07 K/uL    Comment: Performed at [facility], [address] [place], [place] [address]   CT HEAD WO CONTRAST ( )  Result Date: 07/27/2022 CLINICAL DATA:  Trauma.  Tripped over dog.  Fell on right side. EXAM: CT HEAD WITHOUT CONTRAST CT CERVICAL SPINE WITHOUT CONTRAST TECHNIQUE: Multidetector CT imaging of the head and cervical spine was performed following the standard protocol without intravenous contrast. Multiplanar CT image reconstructions of the cervical spine were also generated. RADIATION DOSE REDUCTION: This exam was performed according to the departmental dose-optimization program which includes automated exposure control, adjustment of the mA and/or kV according to patient size and/or use of iterative reconstruction technique. COMPARISON:  07/27/22 CT Brain FINDINGS: CT HEAD FINDINGS Brain: No evidence of acute infarction, hemorrhage,  hydrocephalus, extra-axial collection or mass lesion/mass effect. Sequela of mild chronic microvascular ischemic change. Vascular: No hyperdense vessel or unexpected calcification. Skull: No evidence of calvarial fracture. Unchanged appearance of the nasal bone compared to 20 14. Sinuses/Orbits: Mild mucosal thickening bilateral maxillary sinuses. Mucosal thickening is also seen in the bilateral ethmoid sinuses. Frothy secretions are noted in the left sphenoid sinus. There is occlusion of the bilateral sphenoethmoidal recesses. Other: None. CT CERVICAL SPINE FINDINGS Alignment: Normal. Skull base and vertebrae: No acute fracture. No  primary bone lesion or focal pathologic process. Soft tissues and spinal canal: There is subcutaneous emphysema in the lower right neck and posterior neck soft tissues extending superiorly to the C5 vertebral body level. Disc levels: No evidence of high-grade spinal canal or neural foraminal stenosis. Upper chest: Please see separately dictated CT chest abdomen pelvis for additional findings including a right apical pneumothorax. The right-sided pigtail pleural drainage catheter in place. Other: None IMPRESSION: CT HEAD: 1.  No acute intracranial abnormality. 2. Pansinus mucosal disease with frothy secretions in the left sphenoid sinus; correlate for symptoms of acute sinusitis. CT CERVICAL SPINE: 1. No acute fracture or traumatic malalignment of the cervical spine. 2. Subcutaneous emphysema in the lower right neck and posterior neck soft tissues extending superiorly to the C5 vertebral body level. See separately dictated CT chest, abdomen, pelvis for findings regarding right sided pneumothorax. Electronically Signed   By: Lorenza Cambridge M.D.   On: 07/27/2022 09:09   CT CERVICAL SPINE WO CONTRAST  Result Date: 07/27/2022 CLINICAL DATA:  Trauma.  Tripped over dog.  Fell on right side. EXAM: CT HEAD WITHOUT CONTRAST CT CERVICAL SPINE WITHOUT CONTRAST TECHNIQUE: Multidetector CT imaging  of the head and cervical spine was performed following the standard protocol without intravenous contrast. Multiplanar CT image reconstructions of the cervical spine were also generated. RADIATION DOSE REDUCTION: This exam was performed according to the departmental dose-optimization program which includes automated exposure control, adjustment of the mA and/or kV according to patient size and/or use of iterative reconstruction technique. COMPARISON:  07/27/22 CT Brain FINDINGS: CT HEAD FINDINGS Brain: No evidence of acute infarction, hemorrhage, hydrocephalus, extra-axial collection or mass lesion/mass effect. Sequela of mild chronic microvascular ischemic change. Vascular: No hyperdense vessel or unexpected calcification. Skull: No evidence of calvarial fracture. Unchanged appearance of the nasal bone compared to 20 14. Sinuses/Orbits: Mild mucosal thickening bilateral maxillary sinuses. Mucosal thickening is also seen in the bilateral ethmoid sinuses. Frothy secretions are noted in the left sphenoid sinus. There is occlusion of the bilateral sphenoethmoidal recesses. Other: None. CT CERVICAL SPINE FINDINGS Alignment: Normal. Skull base and vertebrae: No acute fracture. No primary bone lesion or focal pathologic process. Soft tissues and spinal canal: There is subcutaneous emphysema in the lower right neck and posterior neck soft tissues extending superiorly to the C5 vertebral body level. Disc levels: No evidence of high-grade spinal canal or neural foraminal stenosis. Upper chest: Please see separately dictated CT chest abdomen pelvis for additional findings including a right apical pneumothorax. The right-sided pigtail pleural drainage catheter in place. Other: None IMPRESSION: CT HEAD: 1.  No acute intracranial abnormality. 2. Pansinus mucosal disease with frothy secretions in the left sphenoid sinus; correlate for symptoms of acute sinusitis. CT CERVICAL SPINE: 1. No acute fracture or traumatic malalignment  of the cervical spine. 2. Subcutaneous emphysema in the lower right neck and posterior neck soft tissues extending superiorly to the C5 vertebral body level. See separately dictated CT chest, abdomen, pelvis for findings regarding right sided pneumothorax. Electronically Signed   By: Lorenza Cambridge M.D.   On: 07/27/2022 09:09   CT CHEST ABDOMEN PELVIS W CONTRAST  Result Date: 07/27/2022 CLINICAL DATA:  51 year old male with history of trauma from a fall. Right-sided rib pain and shortness of breath. EXAM: CT CHEST, ABDOMEN, AND PELVIS WITH CONTRAST TECHNIQUE: Multidetector CT imaging of the chest, abdomen and pelvis was performed following the standard protocol during bolus administration of intravenous contrast. RADIATION DOSE REDUCTION: This exam was performed according to the  departmental dose-optimization program which includes automated exposure control, adjustment of the mA and/or kV according to patient size and/or use of iterative reconstruction technique. CONTRAST:  10mL OMNIPAQUE IOHEXOL 350 MG/ML SOLN COMPARISON:  CT of the chest, abdomen and pelvis 09/16/2014. FINDINGS: CT CHEST FINDINGS Cardiovascular: No abnormal high attenuation fluid within the mediastinum to suggest posttraumatic mediastinal hematoma. No evidence of posttraumatic aortic dissection/transection. Heart size is normal. There is no significant pericardial fluid, thickening or pericardial calcification. No atherosclerotic calcifications are noted in the thoracic aorta or the coronary arteries. Mediastinum/Nodes: No pathologically enlarged mediastinal or hilar lymph nodes. Esophagus is unremarkable in appearance. No axillary lymphadenopathy. Lungs/Pleura: Small right-sided pneumothorax. Small volume of low-intermediate attenuation fluid lying dependently in the right hemithorax. Right-sided chest tube in position with pigtail reformed in the lateral aspect of the upper right hemithorax. Areas of passive subsegmental atelectasis are  noted in the dependent portion of the right lower lobe. No confluent consolidative airspace disease. No left pleural effusion. Minimal dependent subsegmental atelectasis is also noted in the base of the left lower lobe. Musculoskeletal: Extensive subcutaneous emphysema in the right chest wall. Minimally displaced fractures of the lateral aspects of the right eighth, ninth and tenth ribs. Additional minimally displaced fracture of the posterolateral right ninth rib. Nondisplaced fracture of the posterior right tenth rib. Multiple old healed right-sided rib fractures are also incidentally noted. There are no aggressive appearing lytic or blastic lesions noted in the visualized portions of the skeleton. CT ABDOMEN PELVIS FINDINGS Hepatobiliary: No evidence of significant acute traumatic injury to the liver. No suspicious cystic or solid hepatic lesions. No intra or extrahepatic biliary ductal dilatation. Gallbladder is normal in appearance. Pancreas: No evidence of significant acute traumatic injury to the pancreas. No pancreatic mass. No pancreatic ductal dilatation. No pancreatic or peripancreatic fluid collections or inflammatory changes. Spleen: No evidence of significant acute traumatic injury to the spleen. Unremarkable. Adrenals/Urinary Tract: No evidence of significant acute traumatic injury to either kidney or adrenal gland. Bilateral kidneys and bilateral adrenal glands are normal in appearance. No hydroureteronephrosis. Urinary bladder is intact and unremarkable in appearance. Stomach/Bowel: No definitive evidence to suggest significant acute traumatic injury to the hollow viscera. The appearance of the stomach is normal. There is no pathologic dilatation of small bowel or colon. The appendix is not confidently identified and may be surgically absent. Regardless, there are no inflammatory changes noted adjacent to the cecum to suggest the presence of an acute appendicitis at this time. Vascular/Lymphatic: No  evidence of significant acute traumatic injury to the abdominal aorta or major arteries/veins of the abdomen and pelvis. Mild aortic atherosclerosis. No aneurysm or dissection noted in the abdominal or pelvic vasculature. No lymphadenopathy noted in the abdomen or pelvis. Reproductive: Prostate gland and seminal vesicles are unremarkable in appearance. Other: No high attenuation fluid in the peritoneal cavity or retroperitoneum to suggest significant posttraumatic hemorrhage. No significant volume of ascites. No pneumoperitoneum. Musculoskeletal: No acute displaced fractures or aggressive appearing lytic or blastic lesions are noted in the visualized portions of the skeleton. IMPRESSION: 1. Multiple acute minimally displaced and nondisplaced fractures of the right eighth, ninth and tenth ribs with small right-sided hydropneumothorax with small bore chest tube in position in the superolateral aspect of the right hemithorax, as above. 2. No other evidence of significant acute traumatic injury to the abdomen or pelvis. Electronically Signed   By: Vinnie Langton M.D.   On: 07/27/2022 09:05   DG Chest Portable 1 View  Result Date: 07/27/2022 CLINICAL  DATA:  Chest tube placement for pneumothorax. EXAM: PORTABLE CHEST 1 VIEW COMPARISON:  July 27, 2022 FINDINGS: EKG leads project over the chest. Trachea midline. Cardiomediastinal contours and hilar structures are normal. Resolution of midline shift from RIGHT to LEFT at was seen on the previous study following placement of RIGHT-sided chest tube also with resolution or near complete resolution of RIGHT-sided pneumothorax following placement this chest tube. Chest tube terminates along the RIGHT lateral upper chest. Re-expansion of the RIGHT lung without visible pneumothorax on the current study. RIGHT-sided subcutaneous emphysema is similar to the previous exam. Suspected RIGHT-sided rib fractures are not as well seen as on the previous exam. Potential fractures  of sixth and seventh ribs on the RIGHT posteriorly. IMPRESSION: 1. No visible pneumothorax following placement of RIGHT-sided chest tube with persistent subcutaneous emphysema along the RIGHT chest wall. 2. Resolution of midline shift from RIGHT to LEFT that was seen on the previous study. 3. Potential fractures of sixth and seventh ribs on the RIGHT posteriorly. Correlate with point tenderness in this area. Electronically Signed   By: Donzetta Kohut M.D.   On: 07/27/2022 08:07   DG Chest 2 View  Result Date: 07/27/2022 CLINICAL DATA:  51 year old male with history of trauma from a fall complaining of right-sided rib pain. EXAM: CHEST - 2 VIEW COMPARISON:  Chest x-ray 12/13/2014. FINDINGS: Large right-sided pneumothorax occupying greater than 50% of the volume of the right hemithorax. Left lung is clear. No left pneumothorax. Opacity at the right base likely reflects passive atelectasis in the right middle and lower lobes. Mild blunting of the right costophrenic sulcus may suggest the presence of right-sided pleural fluid as well. No evidence of pulmonary edema. Heart size is normal. There may be slight leftward shift of cardiomediastinal structures, but this is not dramatic at this time. Multiple old healed right-sided rib fractures are noted. Probable acute minimally displaced fracture of the posterolateral aspect of the right eighth rib. Gas is noted in the right chest wall. IMPRESSION: 1. Probable acute minimally displaced fracture of the posterolateral aspect of the right eighth rib with large right-sided pneumothorax, which is demonstrating evidence suggestive of early tension type pneumothorax. Chest tube placement is advised. Critical Value/emergent results were called by telephone at the time of interpretation on 07/27/2022 at 6:01 am to provider Dr. Bebe Shaggy, who verbally acknowledged these results. Electronically Signed   By: Trudie Reed M.D.   On: 07/27/2022 06:02    Pending Labs Unresulted  Labs (From admission, onward)     Start     Ordered   07/28/22 0500  CBC  Tomorrow morning,   R        07/27/22 0845   07/28/22 0500  Basic metabolic panel  Tomorrow morning,   R        07/27/22 0845            Vitals/Pain Today's Vitals   07/27/22 1545 07/27/22 1645 07/27/22 1700 07/27/22 1703  BP: (!) 149/87 (!) 157/89 (!) 164/84   Pulse: 85 86 82   Resp: Temp:    98.2 F (36.8 C)  TempSrc:    Oral  SpO2: 97% 98% 99%   PainSc:    8     Isolation Precautions No active isolations  Medications Medications  lactated ringers infusion ( Intravenous New Bag/Given 07/27/22 0926)  acetaminophen (TYLENOL) tablet 1,000 mg (1,000 mg Oral Given 07/27/22 0924)  oxyCODONE (Oxy IR/ROXICODONE) immediate release tablet 5-10 mg (5 mg Oral  Given 07/27/22 1515)  morphine (PF) 4 MG/ML injection 4 mg (has no administration in time range)  docusate sodium (COLACE) capsule 100 mg (100 mg Oral Given 07/27/22 0924)  ondansetron (ZOFRAN-ODT) disintegrating tablet 4 mg (has no administration in time range)    Or  ondansetron (ZOFRAN) injection 4 mg (has no administration in time range)  enoxaparin (LOVENOX) injection 30 mg (has no administration in time range)  methocarbamol (ROBAXIN) tablet 1,000 mg (1,000 mg Oral Given 07/27/22 1515)  ketorolac (TORADOL) 15 MG/ML injection 30 mg (30 mg Intravenous Given 07/27/22 1153)  nicotine (NICODERM CQ - dosed in mg/24 hours) patch 21 mg (21 mg Transdermal Patch Applied 07/27/22 0940)  amphetamine-dextroamphetamine (ADDERALL) tablet 20 mg (20 mg Oral Not Given 07/27/22 1010)  finasteride (PROSCAR) tablet 5 mg (5 mg Oral Given 07/27/22 1152)  fentaNYL (SUBLIMAZE) injection 100 mcg (100 mcg Intravenous Given 07/27/22 0625)  morphine (PF) 4 MG/ML injection 6 mg (6 mg Intravenous Given 07/27/22 0738)  lidocaine (XYLOCAINE) 2 % (with pres) injection 200 mg (200 mg Intradermal Given by Other 07/27/22 0738)  iohexol (OMNIPAQUE) 350 MG/ML injection 75  mL (75 mLs Intravenous Contrast Given 07/27/22 0852)    Mobility walks Low fall risk   Focused Assessments Pulmonary Assessment Handoff:  Lung sounds: Bilateral Breath Sounds: Clear L Breath Sounds: Clear R Breath Sounds: Clear, Diminished O2 Device: Nasal Cannula O2 Flow Rate (L/min): 3 L/min    R Recommendations: See Admitting Provider Note  Report given to:   Additional Notes:

## 2022-07-28 ENCOUNTER — Inpatient Hospital Stay (HOSPITAL_COMMUNITY): Payer: 59

## 2022-07-28 LAB — BASIC METABOLIC PANEL
Anion gap: 9 (ref 5–15)
BUN: 13 mg/dL (ref 6–20)
CO2: 26 mmol/L (ref 22–32)
Calcium: 9.1 mg/dL (ref 8.9–10.3)
Chloride: 105 mmol/L (ref 98–111)
Creatinine, Ser: 0.98 mg/dL (ref 0.61–1.24)
GFR, Estimated: >60 mL/min (ref >60)
Glucose, Bld: 110 mg/dL — ABNORMAL HIGH (ref 70–99)
Potassium: 3.7 mmol/L (ref 3.5–5.1)
Sodium: 140 mmol/L (ref 135–145)

## 2022-07-28 LAB — CBC
HCT: 36.2 % — ABNORMAL LOW (ref 39.0–52.0)
Hemoglobin: 12 g/dL — ABNORMAL LOW (ref 13.0–17.0)
MCH: 32.3 pg (ref 26.0–34.0)
MCHC: 33.1 g/dL (ref 30.0–36.0)
MCV: 97.6 fL (ref 80.0–100.0)
Platelets: 214 10*3/uL (ref 150–400)
RBC: 3.71 MIL/uL — ABNORMAL LOW (ref 4.22–5.81)
RDW: 13.6 % (ref 11.5–15.5)
WBC: 5.8 10*3/uL (ref 4.0–10.5)
nRBC: 0 % (ref 0.0–0.2)

## 2022-07-28 MED ORDER — OXYCODONE HCL 5 MG PO TABS: 5.0000 mg | ORAL_TABLET | ORAL | Status: DC | PRN

## 2022-07-28 MED ORDER — OXYCODONE HCL 5 MG PO TABS
5.0000 mg | ORAL_TABLET | ORAL | Status: DC | PRN
  Administered 2022-07-28 – 2022-07-30 (×5): 10 mg via ORAL
  Administered 2022-07-30: 5 mg via ORAL
  Filled 2022-07-28: qty 1
  Filled 2022-07-28 (×5): qty 2

## 2022-07-28 MED ORDER — BUPROPION HCL ER (SR) 150 MG PO TB12
150.0000 mg | ORAL_TABLET | Freq: Two times a day (BID) | ORAL | Status: DC
  Administered 2022-07-28 – 2022-07-30 (×5): 150 mg via ORAL
  Filled 2022-07-28 (×7): qty 1

## 2022-07-28 MED ORDER — LIDOCAINE 5 % EX PTCH
1.0000 | MEDICATED_PATCH | CUTANEOUS | Status: DC
  Administered 2022-07-28 – 2022-07-29 (×2): 1 via TRANSDERMAL
  Filled 2022-07-28 (×3): qty 1

## 2022-07-28 MED ORDER — VENLAFAXINE HCL ER 75 MG PO CP24
150.0000 mg | ORAL_CAPSULE | Freq: Every day | ORAL | Status: DC
  Administered 2022-07-28 – 2022-07-30 (×3): 150 mg via ORAL
  Filled 2022-07-28 (×3): qty 2

## 2022-07-28 MED ORDER — OXYCODONE HCL 5 MG PO TABS
5.0000 mg | ORAL_TABLET | Freq: Four times a day (QID) | ORAL | Status: DC
  Administered 2022-07-28 – 2022-07-30 (×9): 5 mg via ORAL
  Filled 2022-07-28 (×9): qty 1

## 2022-07-28 NOTE — Progress Notes (Addendum)
Progress Note     Subjective: Having significant rib fracture pain. Tolerating diet. Denies sensation of SHOB but not taking deep breaths due to pain.   States he has chronic pain - greatest in his back - for which he takes oxycodone 5mg  4 times a day   Objective: Vital signs in last 24 hours: Temp:  [97.8 F (36.6 C)-98.6 F (37 C)] 98.6 F (37 C) (10/18 0457) Pulse Rate:  [73-93] 80 (10/18 0457) Resp:  [11-20] 17 (10/18 0457) BP: (129-164)/(79-107) 137/96 (10/18 0457) SpO2:  [96 %-100 %] 98 % (10/18 0457) Last BM Date : 07/26/22  Intake/Output from previous day: 10/17 0701 - 10/18 0700 In: 1269.3 [I.V.:1269.3] Out: 700 [Urine:650; Chest Tube:50] Intake/Output this shift: No intake/output data recorded.  PE: General: pleasant, WD, male who is laying in bed in NAD HEENT: head is normocephalic, atraumatic.  Sclera are noninjected.  Pupils equal and round. EOMs intact.  Ears and nose without any masses or lesions.  Mouth is pink and moist Heart: regular, rate, and rhythm Lungs: CTAB, no wheezes, rhonchi, or rales noted.  Respiratory effort nonlabored. CT in place on suction, no air leak this am. 80 ml SS in cannister Abd: soft, NT, ND MSK: all 4 extremities are symmetrical with no cyanosis, clubbing, or edema. Skin: warm and dry Psych: A&Ox3 with an appropriate affect.    Lab Results:  Recent Labs    07/27/22 0620 07/28/22 0247  WBC 12.6* 5.8  HGB 14.6 12.0*  HCT 43.3 36.2*  PLT 278 214   BMET Recent Labs    07/27/22 0620 07/28/22 0247  NA 138 140  K 3.7 3.7  CL 107 105  CO2 22 26  GLUCOSE 141* 110*  BUN 13 13  CREATININE 0.99 0.98  CALCIUM 9.3 9.1   PT/INR No results for input(s): "LABPROT", "INR" in the last 72 hours. CMP     Component Value Date/Time   NA 140 07/28/2022 0247   NA 141 06/24/2022 1435   K 3.7 07/28/2022 0247   CL 105 07/28/2022 0247   CO2 26 07/28/2022 0247   GLUCOSE 110 (H) 07/28/2022 0247   BUN 13 07/28/2022 0247   BUN  11 06/24/2022 1435   CREATININE 0.98 07/28/2022 0247   CALCIUM 9.1 07/28/2022 0247   PROT 7.4 06/24/2022 1435   ALBUMIN 4.7 06/24/2022 1435   AST 20 06/24/2022 1435   ALT 12 06/24/2022 1435   ALKPHOS 119 06/24/2022 1435   BILITOT 0.5 06/24/2022 1435   GFRNONAA >60 07/28/2022 0247   GFRAA >60 08/31/2017 0329   Lipase  No results found for: "LIPASE"     Studies/Results: CT HEAD WO CONTRAST (5MM)  Result Date: 07/27/2022 CLINICAL DATA:  Trauma.  Tripped over dog.  Fell on right side. EXAM: CT HEAD WITHOUT CONTRAST CT CERVICAL SPINE WITHOUT CONTRAST TECHNIQUE: Multidetector CT imaging of the head and cervical spine was performed following the standard protocol without intravenous contrast. Multiplanar CT image reconstructions of the cervical spine were also generated. RADIATION DOSE REDUCTION: This exam was performed according to the departmental dose-optimization program which includes automated exposure control, adjustment of the mA and/or kV according to patient size and/or use of iterative reconstruction technique. COMPARISON:  07/27/22 CT Brain FINDINGS: CT HEAD FINDINGS Brain: No evidence of acute infarction, hemorrhage, hydrocephalus, extra-axial collection or mass lesion/mass effect. Sequela of mild chronic microvascular ischemic change. Vascular: No hyperdense vessel or unexpected calcification. Skull: No evidence of calvarial fracture. Unchanged appearance of the nasal bone compared  to 20 14. Sinuses/Orbits: Mild mucosal thickening bilateral maxillary sinuses. Mucosal thickening is also seen in the bilateral ethmoid sinuses. Frothy secretions are noted in the left sphenoid sinus. There is occlusion of the bilateral sphenoethmoidal recesses. Other: None. CT CERVICAL SPINE FINDINGS Alignment: Normal. Skull base and vertebrae: No acute fracture. No primary bone lesion or focal pathologic process. Soft tissues and spinal canal: There is subcutaneous emphysema in the lower right neck and  posterior neck soft tissues extending superiorly to the C5 vertebral body level. Disc levels: No evidence of high-grade spinal canal or neural foraminal stenosis. Upper chest: Please see separately dictated CT chest abdomen pelvis for additional findings including a right apical pneumothorax. The right-sided pigtail pleural drainage catheter in place. Other: None IMPRESSION: CT HEAD: 1.  No acute intracranial abnormality. 2. Pansinus mucosal disease with frothy secretions in the left sphenoid sinus; correlate for symptoms of acute sinusitis. CT CERVICAL SPINE: 1. No acute fracture or traumatic malalignment of the cervical spine. 2. Subcutaneous emphysema in the lower right neck and posterior neck soft tissues extending superiorly to the C5 vertebral body level. See separately dictated CT chest, abdomen, pelvis for findings regarding right sided pneumothorax. Electronically Signed   By: Marin Roberts M.D.   On: 07/27/2022 09:09   CT CERVICAL SPINE WO CONTRAST  Result Date: 07/27/2022 CLINICAL DATA:  Trauma.  Tripped over dog.  Fell on right side. EXAM: CT HEAD WITHOUT CONTRAST CT CERVICAL SPINE WITHOUT CONTRAST TECHNIQUE: Multidetector CT imaging of the head and cervical spine was performed following the standard protocol without intravenous contrast. Multiplanar CT image reconstructions of the cervical spine were also generated. RADIATION DOSE REDUCTION: This exam was performed according to the departmental dose-optimization program which includes automated exposure control, adjustment of the mA and/or kV according to patient size and/or use of iterative reconstruction technique. COMPARISON:  07/27/22 CT Brain FINDINGS: CT HEAD FINDINGS Brain: No evidence of acute infarction, hemorrhage, hydrocephalus, extra-axial collection or mass lesion/mass effect. Sequela of mild chronic microvascular ischemic change. Vascular: No hyperdense vessel or unexpected calcification. Skull: No evidence of calvarial fracture.  Unchanged appearance of the nasal bone compared to 20 14. Sinuses/Orbits: Mild mucosal thickening bilateral maxillary sinuses. Mucosal thickening is also seen in the bilateral ethmoid sinuses. Frothy secretions are noted in the left sphenoid sinus. There is occlusion of the bilateral sphenoethmoidal recesses. Other: None. CT CERVICAL SPINE FINDINGS Alignment: Normal. Skull base and vertebrae: No acute fracture. No primary bone lesion or focal pathologic process. Soft tissues and spinal canal: There is subcutaneous emphysema in the lower right neck and posterior neck soft tissues extending superiorly to the C5 vertebral body level. Disc levels: No evidence of high-grade spinal canal or neural foraminal stenosis. Upper chest: Please see separately dictated CT chest abdomen pelvis for additional findings including a right apical pneumothorax. The right-sided pigtail pleural drainage catheter in place. Other: None IMPRESSION: CT HEAD: 1.  No acute intracranial abnormality. 2. Pansinus mucosal disease with frothy secretions in the left sphenoid sinus; correlate for symptoms of acute sinusitis. CT CERVICAL SPINE: 1. No acute fracture or traumatic malalignment of the cervical spine. 2. Subcutaneous emphysema in the lower right neck and posterior neck soft tissues extending superiorly to the C5 vertebral body level. See separately dictated CT chest, abdomen, pelvis for findings regarding right sided pneumothorax. Electronically Signed   By: Marin Roberts M.D.   On: 07/27/2022 09:09   CT CHEST ABDOMEN PELVIS W CONTRAST  Result Date: 07/27/2022 CLINICAL DATA:  51 year old male with  history of trauma from a fall. Right-sided rib pain and shortness of breath. EXAM: CT CHEST, ABDOMEN, AND PELVIS WITH CONTRAST TECHNIQUE: Multidetector CT imaging of the chest, abdomen and pelvis was performed following the standard protocol during bolus administration of intravenous contrast. RADIATION DOSE REDUCTION: This exam was performed  according to the departmental dose-optimization program which includes automated exposure control, adjustment of the mA and/or kV according to patient size and/or use of iterative reconstruction technique. CONTRAST:  56mL OMNIPAQUE IOHEXOL 350 MG/ML SOLN COMPARISON:  CT of the chest, abdomen and pelvis 09/16/2014. FINDINGS: CT CHEST FINDINGS Cardiovascular: No abnormal high attenuation fluid within the mediastinum to suggest posttraumatic mediastinal hematoma. No evidence of posttraumatic aortic dissection/transection. Heart size is normal. There is no significant pericardial fluid, thickening or pericardial calcification. No atherosclerotic calcifications are noted in the thoracic aorta or the coronary arteries. Mediastinum/Nodes: No pathologically enlarged mediastinal or hilar lymph nodes. Esophagus is unremarkable in appearance. No axillary lymphadenopathy. Lungs/Pleura: Small right-sided pneumothorax. Small volume of low-intermediate attenuation fluid lying dependently in the right hemithorax. Right-sided chest tube in position with pigtail reformed in the lateral aspect of the upper right hemithorax. Areas of passive subsegmental atelectasis are noted in the dependent portion of the right lower lobe. No confluent consolidative airspace disease. No left pleural effusion. Minimal dependent subsegmental atelectasis is also noted in the base of the left lower lobe. Musculoskeletal: Extensive subcutaneous emphysema in the right chest wall. Minimally displaced fractures of the lateral aspects of the right eighth, ninth and tenth ribs. Additional minimally displaced fracture of the posterolateral right ninth rib. Nondisplaced fracture of the posterior right tenth rib. Multiple old healed right-sided rib fractures are also incidentally noted. There are no aggressive appearing lytic or blastic lesions noted in the visualized portions of the skeleton. CT ABDOMEN PELVIS FINDINGS Hepatobiliary: No evidence of significant  acute traumatic injury to the liver. No suspicious cystic or solid hepatic lesions. No intra or extrahepatic biliary ductal dilatation. Gallbladder is normal in appearance. Pancreas: No evidence of significant acute traumatic injury to the pancreas. No pancreatic mass. No pancreatic ductal dilatation. No pancreatic or peripancreatic fluid collections or inflammatory changes. Spleen: No evidence of significant acute traumatic injury to the spleen. Unremarkable. Adrenals/Urinary Tract: No evidence of significant acute traumatic injury to either kidney or adrenal gland. Bilateral kidneys and bilateral adrenal glands are normal in appearance. No hydroureteronephrosis. Urinary bladder is intact and unremarkable in appearance. Stomach/Bowel: No definitive evidence to suggest significant acute traumatic injury to the hollow viscera. The appearance of the stomach is normal. There is no pathologic dilatation of small bowel or colon. The appendix is not confidently identified and may be surgically absent. Regardless, there are no inflammatory changes noted adjacent to the cecum to suggest the presence of an acute appendicitis at this time. Vascular/Lymphatic: No evidence of significant acute traumatic injury to the abdominal aorta or major arteries/veins of the abdomen and pelvis. Mild aortic atherosclerosis. No aneurysm or dissection noted in the abdominal or pelvic vasculature. No lymphadenopathy noted in the abdomen or pelvis. Reproductive: Prostate gland and seminal vesicles are unremarkable in appearance. Other: No high attenuation fluid in the peritoneal cavity or retroperitoneum to suggest significant posttraumatic hemorrhage. No significant volume of ascites. No pneumoperitoneum. Musculoskeletal: No acute displaced fractures or aggressive appearing lytic or blastic lesions are noted in the visualized portions of the skeleton. IMPRESSION: 1. Multiple acute minimally displaced and nondisplaced fractures of the right  eighth, ninth and tenth ribs with small right-sided hydropneumothorax with small bore  chest tube in position in the superolateral aspect of the right hemithorax, as above. 2. No other evidence of significant acute traumatic injury to the abdomen or pelvis. Electronically Signed   By: Vinnie Langton M.D.   On: 07/27/2022 09:05   DG Chest Portable 1 View  Result Date: 07/27/2022 CLINICAL DATA:  Chest tube placement for pneumothorax. EXAM: PORTABLE CHEST 1 VIEW COMPARISON:  July 27, 2022 FINDINGS: EKG leads project over the chest. Trachea midline. Cardiomediastinal contours and hilar structures are normal. Resolution of midline shift from RIGHT to LEFT at was seen on the previous study following placement of RIGHT-sided chest tube also with resolution or near complete resolution of RIGHT-sided pneumothorax following placement this chest tube. Chest tube terminates along the RIGHT lateral upper chest. Re-expansion of the RIGHT lung without visible pneumothorax on the current study. RIGHT-sided subcutaneous emphysema is similar to the previous exam. Suspected RIGHT-sided rib fractures are not as well seen as on the previous exam. Potential fractures of sixth and seventh ribs on the RIGHT posteriorly. IMPRESSION: 1. No visible pneumothorax following placement of RIGHT-sided chest tube with persistent subcutaneous emphysema along the RIGHT chest wall. 2. Resolution of midline shift from RIGHT to LEFT that was seen on the previous study. 3. Potential fractures of sixth and seventh ribs on the RIGHT posteriorly. Correlate with point tenderness in this area. Electronically Signed   By: Zetta Bills M.D.   On: 07/27/2022 08:07   DG Chest 2 View  Result Date: 07/27/2022 CLINICAL DATA:  51 year old male with history of trauma from a fall complaining of right-sided rib pain. EXAM: CHEST - 2 VIEW COMPARISON:  Chest x-ray 12/13/2014. FINDINGS: Large right-sided pneumothorax occupying greater than 50% of the volume  of the right hemithorax. Left lung is clear. No left pneumothorax. Opacity at the right base likely reflects passive atelectasis in the right middle and lower lobes. Mild blunting of the right costophrenic sulcus may suggest the presence of right-sided pleural fluid as well. No evidence of pulmonary edema. Heart size is normal. There may be slight leftward shift of cardiomediastinal structures, but this is not dramatic at this time. Multiple old healed right-sided rib fractures are noted. Probable acute minimally displaced fracture of the posterolateral aspect of the right eighth rib. Gas is noted in the right chest wall. IMPRESSION: 1. Probable acute minimally displaced fracture of the posterolateral aspect of the right eighth rib with large right-sided pneumothorax, which is demonstrating evidence suggestive of early tension type pneumothorax. Chest tube placement is advised. Critical Value/emergent results were called by telephone at the time of interpretation on 07/27/2022 at 6:01 am to provider Dr. Christy Gentles, who verbally acknowledged these results. Electronically Signed   By: Vinnie Langton M.D.   On: 07/27/2022 06:02    Anti-infectives: Anti-infectives (From admission, onward)    None        Assessment/Plan  GLF R tension PTX - CT in place on suction, -20cm, CXR this am with trace apical PTX. Continue suction today R 8-10 rib fxs - pain control, IS, pulm toilet MS - outpt med, injection q monthly Chronic pain - multimodal pain control - takes oxycodone 5 mg 4 times daily. PMP report reviewed. Added baseline schedule pain medication this am Depression - home meds ordered - wellbutrin, effexor CT H, cspine, ch/ab/pelvis negative except as above FEN - regular diet VTE - Lovenox ID - none needed Admit - inpatient, med-surg, am cxr   I reviewed last 24 h vitals and pain scores, last 48  h intake and output, last 24 h labs and trends, and last 24 h imaging results.   LOS: 1 day   [facility] 07/28/2022, 7:39 AM Please see Amion for pager number during day hours 7:00am-4:30pm

## 2022-07-28 NOTE — TOC Initial Note (Signed)
Transition of Care [facility]) - Initial/Assessment Note    Patient Details  Name: Gabriel Burns MRN: [id number] Date of Birth: 1971-01-19  Transition of Care [facility] CM/SW Contact:    Ella Bodo, RN Phone Number: 07/28/2022, 5:07 PM  Clinical Narrative:                 51 yo male presents to [facility] on 10/17 with fall over dog, + R rib fx and R tension pneumothorax. s/p chest tube placement.  PTA, pt independent and living at home with 43yo son; he states that friends can assist with care as needed.  PT/OT recommending OP follow up; will arrange as recommended, as patient is agreeable.    Expected Discharge Plan: OP Rehab Barriers to Discharge: Continued Medical Work up          Expected Discharge Plan and Services Expected Discharge Plan: OP Rehab   Discharge Planning Services: CM Consult   Living arrangements for the past 2 months: Single Family Home                                      Prior Living Arrangements/Services Living arrangements for the past 2 months: Single Family Home Lives with:: Minor Children Patient language and need for interpreter reviewed:: Yes Do you feel safe going back to the place where you live?: Yes      Need for Family Participation in Patient Care: Yes (Comment) Care giver support system in place?: Yes (comment)   Criminal Activity/Legal Involvement Pertinent to Current Situation/Hospitalization: No - Comment as needed                   Emotional Assessment   Attitude/Demeanor/Rapport: Engaged Affect (typically observed): Accepting Orientation: : Oriented to Self, Oriented to Place, Oriented to  Time, Oriented to Situation      Admission diagnosis:  Pneumothorax [J93.9] Traumatic pneumothorax, initial encounter [S27.0XXA] Closed fracture of multiple ribs of right side, initial encounter [S22.41XA] Patient Active Problem List   Diagnosis Date Noted   Pneumothorax 07/27/2022   Forearm laceration, right, initial  encounter 04/18/2018   Wound drainage 09/06/2017   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   Bulging lumbar disc 04/04/2017   Acute right-sided thoracic back pain 04/04/2017   Sciatica of right side 10/29/2016   Alcohol abuse 10/08/2016   Erectile dysfunction 02/09/2016   Depression 01/08/2016   MS (multiple sclerosis) ([place]) 05/22/2015   COPD (chronic obstructive pulmonary disease) ([place]) 05/22/2015   Tobacco use disorder 05/22/2015   Chronic pain 05/22/2015   PCP:  Sonia Side., FNP Pharmacy:   [id number] - [address], [place] [address] Phone: [phone] Fax: [phone]     Social Determinants of Health (SDOH) Interventions    Readmission Risk Interventions     No data to display         Reinaldo Raddle, RN, BSN  Trauma/Neuro ICU Case Manager [phone]
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
## 2022-07-29 ENCOUNTER — Inpatient Hospital Stay (HOSPITAL_COMMUNITY): Payer: 59

## 2022-07-29 NOTE — Progress Notes (Signed)
Progress Note     Subjective: Pain control improved with baseline pain meds added. No SHOB. No cough. Tolerating diet.   Objective: Vital signs in last 24 hours: Temp:  [97.9 F (36.6 C)-98.4 F (36.9 C)] 98.4 F (36.9 C) (10/19 0520) Pulse Rate:  [89-95] 89 (10/19 0520) Resp:  [17] 17 (10/19 0520) BP: (154-167)/(91-102) 166/102 (10/19 0520) SpO2:  [98 %-100 %] 100 % (10/19 0520) Last BM Date : 07/26/22  Intake/Output from previous day: 10/18 0701 - 10/19 0700 In: 1962.2 [I.V.:1962.2] Out: 1167 [Urine:1125; Chest Tube:42] Intake/Output this shift: No intake/output data recorded.  PE: General: pleasant, WD, male who is sitting up in chair in NAD HEENT: head is normocephalic, atraumatic. Mouth is pink and moist Heart: regular, rate, and rhythm Lungs: CTAB, no wheezes, rhonchi, or rales noted.  Respiratory effort nonlabored. CT in place on suction, no air leak this am. 100 ml SS in cannister. Pulls 1000 on IS Abd: soft, NT, ND MSK: all 4 extremities are symmetrical with no cyanosis, clubbing, or edema. Skin: warm and dry Psych: A&Ox3 with an appropriate affect.    Lab Results:  Recent Labs    07/27/22 0620 07/28/22 0247  WBC 12.6* 5.8  HGB 14.6 12.0*  HCT 43.3 36.2*  PLT 278 214    BMET Recent Labs    07/27/22 0620 07/28/22 0247  NA 138 140  K 3.7 3.7  CL 107 105  CO2 22 26  GLUCOSE 141* 110*  BUN 13 13  CREATININE 0.99 0.98  CALCIUM 9.3 9.1    PT/INR No results for input(s): "LABPROT", "INR" in the last 72 hours. CMP     Component Value Date/Time   NA 140 07/28/2022 0247   NA 141 06/24/2022 1435   K 3.7 07/28/2022 0247   CL 105 07/28/2022 0247   CO2 26 07/28/2022 0247   GLUCOSE 110 (H) 07/28/2022 0247   BUN 13 07/28/2022 0247   BUN 11 06/24/2022 1435   CREATININE 0.98 07/28/2022 0247   CALCIUM 9.1 07/28/2022 0247   PROT 7.4 06/24/2022 1435   ALBUMIN 4.7 06/24/2022 1435   AST 20 06/24/2022 1435   ALT 12 06/24/2022 1435   ALKPHOS 119  06/24/2022 1435   BILITOT 0.5 06/24/2022 1435   GFRNONAA >60 07/28/2022 0247   GFRAA >60 08/31/2017 0329   Lipase  No results found for: "LIPASE"     Studies/Results: DG CHEST PORT 1 VIEW  Result Date: 07/28/2022 CLINICAL DATA:  Pneumothorax. Interval placement of right-sided chest tube. EXAM: PORTABLE CHEST 1 VIEW COMPARISON:  CT of the chest 07/27/2022 FINDINGS: Right-sided chest tube is in place. Trace apical pneumothorax remains. Lung is expanded. Minimal pleural fluid remains. Mild subcutaneous emphysema is improving. Right lateral eighth rib fracture again noted. Left lung is clear. IMPRESSION: 1. Right-sided chest tube in place. 2. Trace residual right apical pneumothorax. The right lung is near completely expanded. 3. Improving subcutaneous emphysema. Electronically Signed   By: San Morelle M.D.   On: 07/28/2022 07:50   CT HEAD WO CONTRAST (5MM)  Result Date: 07/27/2022 CLINICAL DATA:  Trauma.  Tripped over dog.  Fell on right side. EXAM: CT HEAD WITHOUT CONTRAST CT CERVICAL SPINE WITHOUT CONTRAST TECHNIQUE: Multidetector CT imaging of the head and cervical spine was performed following the standard protocol without intravenous contrast. Multiplanar CT image reconstructions of the cervical spine were also generated. RADIATION DOSE REDUCTION: This exam was performed according to the departmental dose-optimization program which includes automated exposure control, adjustment of the  mA and/or kV according to patient size and/or use of iterative reconstruction technique. COMPARISON:  07/27/22 CT Brain FINDINGS: CT HEAD FINDINGS Brain: No evidence of acute infarction, hemorrhage, hydrocephalus, extra-axial collection or mass lesion/mass effect. Sequela of mild chronic microvascular ischemic change. Vascular: No hyperdense vessel or unexpected calcification. Skull: No evidence of calvarial fracture. Unchanged appearance of the nasal bone compared to 20 14. Sinuses/Orbits: Mild mucosal  thickening bilateral maxillary sinuses. Mucosal thickening is also seen in the bilateral ethmoid sinuses. Frothy secretions are noted in the left sphenoid sinus. There is occlusion of the bilateral sphenoethmoidal recesses. Other: None. CT CERVICAL SPINE FINDINGS Alignment: Normal. Skull base and vertebrae: No acute fracture. No primary bone lesion or focal pathologic process. Soft tissues and spinal canal: There is subcutaneous emphysema in the lower right neck and posterior neck soft tissues extending superiorly to the C5 vertebral body level. Disc levels: No evidence of high-grade spinal canal or neural foraminal stenosis. Upper chest: Please see separately dictated CT chest abdomen pelvis for additional findings including a right apical pneumothorax. The right-sided pigtail pleural drainage catheter in place. Other: None IMPRESSION: CT HEAD: 1.  No acute intracranial abnormality. 2. Pansinus mucosal disease with frothy secretions in the left sphenoid sinus; correlate for symptoms of acute sinusitis. CT CERVICAL SPINE: 1. No acute fracture or traumatic malalignment of the cervical spine. 2. Subcutaneous emphysema in the lower right neck and posterior neck soft tissues extending superiorly to the C5 vertebral body level. See separately dictated CT chest, abdomen, pelvis for findings regarding right sided pneumothorax. Electronically Signed   By: Marin Roberts M.D.   On: 07/27/2022 09:09   CT CERVICAL SPINE WO CONTRAST  Result Date: 07/27/2022 CLINICAL DATA:  Trauma.  Tripped over dog.  Fell on right side. EXAM: CT HEAD WITHOUT CONTRAST CT CERVICAL SPINE WITHOUT CONTRAST TECHNIQUE: Multidetector CT imaging of the head and cervical spine was performed following the standard protocol without intravenous contrast. Multiplanar CT image reconstructions of the cervical spine were also generated. RADIATION DOSE REDUCTION: This exam was performed according to the departmental dose-optimization program which includes  automated exposure control, adjustment of the mA and/or kV according to patient size and/or use of iterative reconstruction technique. COMPARISON:  07/27/22 CT Brain FINDINGS: CT HEAD FINDINGS Brain: No evidence of acute infarction, hemorrhage, hydrocephalus, extra-axial collection or mass lesion/mass effect. Sequela of mild chronic microvascular ischemic change. Vascular: No hyperdense vessel or unexpected calcification. Skull: No evidence of calvarial fracture. Unchanged appearance of the nasal bone compared to 20 14. Sinuses/Orbits: Mild mucosal thickening bilateral maxillary sinuses. Mucosal thickening is also seen in the bilateral ethmoid sinuses. Frothy secretions are noted in the left sphenoid sinus. There is occlusion of the bilateral sphenoethmoidal recesses. Other: None. CT CERVICAL SPINE FINDINGS Alignment: Normal. Skull base and vertebrae: No acute fracture. No primary bone lesion or focal pathologic process. Soft tissues and spinal canal: There is subcutaneous emphysema in the lower right neck and posterior neck soft tissues extending superiorly to the C5 vertebral body level. Disc levels: No evidence of high-grade spinal canal or neural foraminal stenosis. Upper chest: Please see separately dictated CT chest abdomen pelvis for additional findings including a right apical pneumothorax. The right-sided pigtail pleural drainage catheter in place. Other: None IMPRESSION: CT HEAD: 1.  No acute intracranial abnormality. 2. Pansinus mucosal disease with frothy secretions in the left sphenoid sinus; correlate for symptoms of acute sinusitis. CT CERVICAL SPINE: 1. No acute fracture or traumatic malalignment of the cervical spine. 2. Subcutaneous emphysema  in the lower right neck and posterior neck soft tissues extending superiorly to the C5 vertebral body level. See separately dictated CT chest, abdomen, pelvis for findings regarding right sided pneumothorax. Electronically Signed   By: Marin Roberts M.D.   On:  07/27/2022 09:09   CT CHEST ABDOMEN PELVIS W CONTRAST  Result Date: 07/27/2022 CLINICAL DATA:  51 year old male with history of trauma from a fall. Right-sided rib pain and shortness of breath. EXAM: CT CHEST, ABDOMEN, AND PELVIS WITH CONTRAST TECHNIQUE: Multidetector CT imaging of the chest, abdomen and pelvis was performed following the standard protocol during bolus administration of intravenous contrast. RADIATION DOSE REDUCTION: This exam was performed according to the departmental dose-optimization program which includes automated exposure control, adjustment of the mA and/or kV according to patient size and/or use of iterative reconstruction technique. CONTRAST:  33mL OMNIPAQUE IOHEXOL 350 MG/ML SOLN COMPARISON:  CT of the chest, abdomen and pelvis 09/16/2014. FINDINGS: CT CHEST FINDINGS Cardiovascular: No abnormal high attenuation fluid within the mediastinum to suggest posttraumatic mediastinal hematoma. No evidence of posttraumatic aortic dissection/transection. Heart size is normal. There is no significant pericardial fluid, thickening or pericardial calcification. No atherosclerotic calcifications are noted in the thoracic aorta or the coronary arteries. Mediastinum/Nodes: No pathologically enlarged mediastinal or hilar lymph nodes. Esophagus is unremarkable in appearance. No axillary lymphadenopathy. Lungs/Pleura: Small right-sided pneumothorax. Small volume of low-intermediate attenuation fluid lying dependently in the right hemithorax. Right-sided chest tube in position with pigtail reformed in the lateral aspect of the upper right hemithorax. Areas of passive subsegmental atelectasis are noted in the dependent portion of the right lower lobe. No confluent consolidative airspace disease. No left pleural effusion. Minimal dependent subsegmental atelectasis is also noted in the base of the left lower lobe. Musculoskeletal: Extensive subcutaneous emphysema in the right chest wall. Minimally  displaced fractures of the lateral aspects of the right eighth, ninth and tenth ribs. Additional minimally displaced fracture of the posterolateral right ninth rib. Nondisplaced fracture of the posterior right tenth rib. Multiple old healed right-sided rib fractures are also incidentally noted. There are no aggressive appearing lytic or blastic lesions noted in the visualized portions of the skeleton. CT ABDOMEN PELVIS FINDINGS Hepatobiliary: No evidence of significant acute traumatic injury to the liver. No suspicious cystic or solid hepatic lesions. No intra or extrahepatic biliary ductal dilatation. Gallbladder is normal in appearance. Pancreas: No evidence of significant acute traumatic injury to the pancreas. No pancreatic mass. No pancreatic ductal dilatation. No pancreatic or peripancreatic fluid collections or inflammatory changes. Spleen: No evidence of significant acute traumatic injury to the spleen. Unremarkable. Adrenals/Urinary Tract: No evidence of significant acute traumatic injury to either kidney or adrenal gland. Bilateral kidneys and bilateral adrenal glands are normal in appearance. No hydroureteronephrosis. Urinary bladder is intact and unremarkable in appearance. Stomach/Bowel: No definitive evidence to suggest significant acute traumatic injury to the hollow viscera. The appearance of the stomach is normal. There is no pathologic dilatation of small bowel or colon. The appendix is not confidently identified and may be surgically absent. Regardless, there are no inflammatory changes noted adjacent to the cecum to suggest the presence of an acute appendicitis at this time. Vascular/Lymphatic: No evidence of significant acute traumatic injury to the abdominal aorta or major arteries/veins of the abdomen and pelvis. Mild aortic atherosclerosis. No aneurysm or dissection noted in the abdominal or pelvic vasculature. No lymphadenopathy noted in the abdomen or pelvis. Reproductive: Prostate gland  and seminal vesicles are unremarkable in appearance. Other: No high attenuation fluid  in the peritoneal cavity or retroperitoneum to suggest significant posttraumatic hemorrhage. No significant volume of ascites. No pneumoperitoneum. Musculoskeletal: No acute displaced fractures or aggressive appearing lytic or blastic lesions are noted in the visualized portions of the skeleton. IMPRESSION: 1. Multiple acute minimally displaced and nondisplaced fractures of the right eighth, ninth and tenth ribs with small right-sided hydropneumothorax with small bore chest tube in position in the superolateral aspect of the right hemithorax, as above. 2. No other evidence of significant acute traumatic injury to the abdomen or pelvis. Electronically Signed   By: Vinnie Langton M.D.   On: 07/27/2022 09:05    Anti-infectives: Anti-infectives (From admission, onward)    None        Assessment/Plan  GLF R tension PTX - CT in place on suction without air leak and CXR this am stable trace apical PTX. WS this am and repeat cxr in am R 8-10 rib fxs - pain control, IS, pulm toilet MS - outpt med, injection q monthly Chronic pain - multimodal pain control - takes oxycodone 5 mg 4 times daily. PMP report reviewed. Added baseline schedule pain medication 10/18 Depression - home meds ordered - wellbutrin, effexor CT H, cspine, ch/ab/pelvis negative except as above FEN - regular diet VTE - Lovenox ID - none needed Admit - WS CT, am cxr   I reviewed last 24 h vitals and pain scores, last 48 h intake and output, last 24 h labs and trends, and last 24 h imaging results.   LOS: 2 days   [facility] 07/29/2022, 8:01 AM Please see Amion for pager number during day hours 7:00am-4:30pm

## 2022-07-29 NOTE — TOC Progression Note (Signed)
Transition of Care [facility]) - Progression Note    Patient Details  Name: Gabriel Burns MRN: [id number] Date of Birth: [birth year]  Transition of Care [facility] CM/SW Contact  Ella Bodo, RN Phone Number: 07/29/2022, 3:58 PM  Clinical Narrative:    Met with patient to discuss PT/OT recommendation for OP therapies.  Patient is agreeable to referral; rehab referral made to [facility] for f/u.     Expected Discharge Plan: OP Rehab Barriers to Discharge: Continued Medical Work up  Expected Discharge Plan and Services Expected Discharge Plan: OP Rehab   Discharge Planning Services: CM Consult   Living arrangements for the past 2 months: Single Family Home                                       Social Determinants of Health (SDOH) Interventions    Readmission Risk Interventions     No data to display         Reinaldo Raddle, RN, BSN  Trauma/Neuro ICU Case Manager [phone]

## 2022-07-29 NOTE — Progress Notes (Signed)
Mobility Specialist - Progress Note   07/29/22 0950  Mobility  Activity Ambulated with assistance in hallway  Level of Assistance Standby assist, set-up cues, supervision of patient - no hands on  Assistive Device Other (Comment) (IV Pole)  Distance Ambulated (ft) 300 ft  Activity Response Tolerated well  $Mobility charge 1 Mobility    Pt received in recliner agreeable to mobility. Left in recliner w/ call bell in reach and all needs met.   Paulla Dolly Mobility Specialist

## 2022-07-30 ENCOUNTER — Inpatient Hospital Stay (HOSPITAL_COMMUNITY): Payer: 59

## 2022-07-30 DIAGNOSIS — S2241XA Multiple fractures of ribs, right side, initial encounter for closed fracture: Secondary | ICD-10-CM | POA: Diagnosis present

## 2022-07-30 MED ORDER — ACETAMINOPHEN 500 MG PO TABS: 1000.0000 mg | ORAL_TABLET | Freq: Four times a day (QID) | ORAL | 0 refills | Status: AC

## 2022-07-30 MED ORDER — OXYCODONE HCL 5 MG PO TABS: 5.0000 mg | ORAL_TABLET | Freq: Four times a day (QID) | ORAL | 0 refills | Status: AC | PRN

## 2022-07-30 MED ORDER — IBUPROFEN 800 MG PO TABS: 800.0000 mg | ORAL_TABLET | Freq: Three times a day (TID) | ORAL | Status: AC | PRN

## 2022-07-30 MED ORDER — DOCUSATE SODIUM 100 MG PO CAPS: 100.0000 mg | ORAL_CAPSULE | Freq: Two times a day (BID) | ORAL | 0 refills | Status: AC | PRN

## 2022-07-30 NOTE — Plan of Care (Signed)
  Problem: Education: Goal: Knowledge of General Education information will improve Description: Including pain rating scale, medication(s)/side effects and non-pharmacologic comfort measures Outcome: Progressing   Problem: Health Behavior/Discharge Planning: Goal: Ability to manage health-related needs will improve Outcome: Progressing   Problem: Clinical Measurements: Goal: Ability to maintain clinical measurements within normal limits will improve Outcome: Progressing Goal: Will remain free from infection Outcome: Progressing Goal: Diagnostic test results will improve Outcome: Progressing Goal: Respiratory complications will improve Outcome: Progressing Goal: Cardiovascular complication will be avoided Outcome: Progressing   Problem: Activity: Goal: Risk for activity intolerance will decrease Outcome: Progressing   Problem: Nutrition: Goal: Adequate nutrition will be maintained Outcome: Progressing   Problem: Coping: Goal: Level of anxiety will decrease Outcome: Progressing   Problem: Elimination: Goal: Will not experience complications related to bowel motility Outcome: Progressing Goal: Will not experience complications related to urinary retention Outcome: Progressing   Problem: Pain Managment: Goal: General experience of comfort will improve Outcome: Progressing   Problem: Safety: Goal: Ability to remain free from injury will improve Outcome: Progressing   Problem: Skin Integrity: Goal: Risk for impaired skin integrity will decrease Outcome: Progressing  Pain control is main goal today, patient very pleasant. Trauma PA removed chest tube this morning and CXR to be done this afternoon. If everything looks good we can d/c patient once we get orders

## 2022-07-30 NOTE — Progress Notes (Signed)
Mobility Specialist - Progress Note   07/30/22 1022  Mobility  Activity Ambulated with assistance in hallway  Level of Assistance Standby assist, set-up cues, supervision of patient - no hands on  Assistive Device None  Distance Ambulated (ft) 300 ft  Activity Response Tolerated well  Mobility Referral Yes  $Mobility charge 1 Mobility   Pt received in room and agreeable to mobility. No complaints through out. Pt was left in chair with all needs met.   Larey Seat

## 2022-07-30 NOTE — Progress Notes (Signed)
Progress Note     Subjective: Pain controlled. No SHOB. Tolerating diet.   Objective: Vital signs in last 24 hours: Temp:  [97.9 F (36.6 C)-98.4 F (36.9 C)] 97.9 F (36.6 C) (10/20 0556) Pulse Rate:  [82-108] 82 (10/20 0556) Resp:  [16-17] 17 (10/20 0556) BP: (135-181)/(97-101) 167/101 (10/20 0556) SpO2:  [99 %-100 %] 99 % (10/20 0556) Last BM Date : 07/26/22  Intake/Output from previous day: 10/19 0701 - 10/20 0700 In: 480 [P.O.:480] Out: 735 [Urine:725; Chest Tube:10] Intake/Output this shift: No intake/output data recorded.  PE: General: pleasant, WD, male who is sitting up in chair in NAD HEENT: head is normocephalic, atraumatic. Mouth is pink and moist Heart: regular, rate, and rhythm Lungs: CTAB, no wheezes, rhonchi, or rales noted.  Respiratory effort nonlabored. CT in place on WS, no air leak this am. 140 ml SS in cannister Abd: soft, NT, ND MSK: all 4 extremities are symmetrical with no cyanosis, clubbing, or edema. Skin: warm and dry Psych: A&Ox3 with an appropriate affect.    Lab Results:  Recent Labs    07/28/22 0247  WBC 5.8  HGB 12.0*  HCT 36.2*  PLT 214    BMET Recent Labs    07/28/22 0247  NA 140  K 3.7  CL 105  CO2 26  GLUCOSE 110*  BUN 13  CREATININE 0.98  CALCIUM 9.1    PT/INR No results for input(s): "LABPROT", "INR" in the last 72 hours. CMP     Component Value Date/Time   NA 140 07/28/2022 0247   NA 141 06/24/2022 1435   K 3.7 07/28/2022 0247   CL 105 07/28/2022 0247   CO2 26 07/28/2022 0247   GLUCOSE 110 (H) 07/28/2022 0247   BUN 13 07/28/2022 0247   BUN 11 06/24/2022 1435   CREATININE 0.98 07/28/2022 0247   CALCIUM 9.1 07/28/2022 0247   PROT 7.4 06/24/2022 1435   ALBUMIN 4.7 06/24/2022 1435   AST 20 06/24/2022 1435   ALT 12 06/24/2022 1435   ALKPHOS 119 06/24/2022 1435   BILITOT 0.5 06/24/2022 1435   GFRNONAA >60 07/28/2022 0247   GFRAA >60 08/31/2017 0329   Lipase  No results found for:  "LIPASE"     Studies/Results: DG CHEST PORT 1 VIEW  Result Date: 07/29/2022 CLINICAL DATA:  [id number] Pneumothorax [id number] EXAM: PORTABLE CHEST 1 VIEW COMPARISON:  07/28/2022 FINDINGS: Unchanged cardiomediastinal silhouette. There is no focal airspace consolidation. There is a right apical chest tube in place, unchanged in position. There is a trace residual right apical pneumothorax measuring 1.6 mm. Unchanged subcutaneous emphysema along the right chest wall. Unchanged right lateral eighth rib fracture. IMPRESSION: Right apical chest tube in place with a trace residual right apical pneumothorax. Unchanged subcutaneous emphysema along the right chest wall. Electronically Signed   By: Maurine Simmering M.D.   On: 07/29/2022 08:12    Anti-infectives: Anti-infectives (From admission, onward)    None        Assessment/Plan  GLF R tension PTX - CT in place on WS without air leak and CXR this am without PTX. I removed CT. Repeat cxr in 4 hours R 8-10 rib fxs - pain control, IS, pulm toilet MS - outpt med, injection q monthly Chronic pain - multimodal pain control - takes oxycodone 5 mg 4 times daily. PMP report reviewed. Added baseline schedule pain medication 10/18 Depression - home meds ordered - wellbutrin, effexor CT H, cspine, ch/ab/pelvis negative except as above FEN - regular diet VTE -  Lovenox ID - none needed Admit - post CT removal cxr 1230. Discharge after if stable   I reviewed last 24 h vitals and pain scores, last 48 h intake and output, last 24 h labs and trends, and last 24 h imaging results.   LOS: 3 days   Eric Form, [facility] 07/30/2022, 7:52 AM Please see Amion for pager number during day hours 7:00am-4:30pm

## 2022-07-30 NOTE — Progress Notes (Signed)
PT Cancellation Note  Patient Details Name: Gabriel Burns MRN: [id number] DOB: [birth year]   Cancelled Treatment:    Reason Eval/Treat Not Completed: Patient declined, no reason specified Patient declining mobility and stair training due to pain and soreness in ribs and flank. Will re-attempt at later date.   Matisha Termine A. Gilford Rile PT, DPT Acute Rehabilitation Services Office [phone]    Linna Hoff 07/30/2022, 3:14 PM

## 2022-07-30 NOTE — Discharge Instructions (Signed)
RIB FRACTURES  HOME INSTRUCTIONS   PAIN CONTROL:  Pain is best controlled by a usual combination of three different methods TOGETHER:  Ice/Heat Over the counter pain medication Prescription pain medication You may experience some swelling and bruising in area of broken ribs. Ice packs or heating pads (30-60 minutes up to 6 times a day) will help. Use ice for the first few days to help decrease swelling and bruising, then switch to heat to help relax tight/sore spots and speed recovery. Some people prefer to use ice alone, heat alone, alternating between ice & heat. Experiment to what works for you. Swelling and bruising can take several weeks to resolve.  It is helpful to take an over-the-counter pain medication regularly for the first few weeks. Choose one of the following that works best for you:  Naproxen (Aleve, etc) Two 220mg tabs twice a day Ibuprofen (Advil, etc) Three 200mg tabs four times a day (every meal & bedtime) Acetaminophen (Tylenol, etc) 500-650mg four times a day (every meal & bedtime) A prescription for pain medication (such as oxycodone, hydrocodone, etc) may be given to you upon discharge. Take your pain medication as prescribed.  If you are having problems/concerns with the prescription medicine (does not control pain, nausea, vomiting, rash, itching, etc), please call us [phone] to see if we need to switch you to a different pain medicine that will work better for you and/or control your side effect better. If you need a refill on your pain medication, please contact your pharmacy. They will contact our office to request authorization. Prescriptions will not be filled after 5 pm or on week-ends. Avoid getting constipated. When taking pain medications, it is common to experience some constipation. Increasing fluid intake and taking a fiber supplement (such as Metamucil, Citrucel, FiberCon, MiraLax, etc) 1-2 times a day regularly will usually help prevent this problem  from occurring. A mild laxative (prune juice, Milk of Magnesia, MiraLax, etc) should be taken according to package directions if there are no bowel movements after 48 hours.  Watch out for diarrhea. If you have many loose bowel movements, simplify your diet to bland foods & liquids for a few days. Stop any stool softeners and decrease your fiber supplement. Switching to mild anti-diarrheal medications (Kayopectate, Pepto Bismol) can help. If this worsens or does not improve, please call us. FOLLOW UP  If a follow up appointment is needed one will be scheduled for you. If none is needed with our trauma team, please follow up with your primary care provider within 2-3 weeks from discharge. Please call [facility] at [phone] if you have any questions about follow up.  If you have any orthopedic or other injuries you will need to follow up as outlined in your follow up instructions.   WHEN TO CALL US [phone]:  Poor pain control Reactions / problems with new medications (rash/itching, nausea, etc)  Fever over 101.5 F (38.5 C) Worsening swelling or bruising Worsening pain, productive cough, difficulty breathing or any other concerning symptoms  The clinic staff is available to answer your questions during regular business hours (8:30am-5pm). Please don't hesitate to call and ask to speak to one of our nurses for clinical concerns.  If you have a medical emergency, go to the nearest emergency room or call 911.  A surgeon from [facility] is always on call at the hospitals   [facility]  [address], [address], [place], [place] [address] ?  MAIN: [phone] ? TOLL FREE: [phone] ?  FAX [phone]  [email or web address]      Information on Rib Fractures  A rib fracture is a break or crack in one of the bones of the ribs. The ribs are long, curved bones that wrap around your chest and attach to your spine and your breastbone. The  ribs protect your heart, lungs, and other organs in the chest. A broken or cracked rib is often painful but is not usually serious. Most rib fractures heal on their own over time. However, rib fractures can be more serious if multiple ribs are broken or if broken ribs move out of place and push against other structures or organs. What are the causes? This condition is caused by: Repetitive movements with high force, such as pitching a baseball or having severe coughing spells. A direct blow to the chest, such as a sports injury, a car accident, or a fall. Cancer that has spread to the bones, which can weaken bones and cause them to break. What are the signs or symptoms? Symptoms of this condition include: Pain when you breathe in or cough. Pain when someone presses on the injured area. Feeling short of breath. How is this diagnosed? This condition is diagnosed with a physical exam and medical history. Imaging tests may also be done, such as: Chest X-ray. CT scan. MRI. Bone scan. Chest ultrasound. How is this treated? Treatment for this condition depends on the severity of the fracture. Most rib fractures usually heal on their own in 1-3 months. Sometimes healing takes longer if there is a cough that does not stop or if there are other activities that make the injury worse (aggravating factors). While you heal, you will be given medicines to control the pain. You will also be taught deep breathing exercises. Severe injuries may require hospitalization or surgery. Follow these instructions at home: Managing pain, stiffness, and swelling If directed, apply ice to the injured area. Put ice in a plastic bag. Place a towel between your skin and the bag. Leave the ice on for 20 minutes, 2-3 times a day. Take over-the-counter and prescription medicines only as told by your health care provider. Activity Avoid a lot of activity and any activities or movements that cause pain. Be careful during  activities and avoid bumping the injured rib. Slowly increase your activity as told by your health care provider. General instructions Do deep breathing exercises as told by your health care provider. This helps prevent pneumonia, which is a common complication of a broken rib. Your health care provider may instruct you to: Take deep breaths several times a day. Try to cough several times a day, holding a pillow against the injured area. Use a device called incentive spirometer to practice deep breathing several times a day. Drink enough fluid to keep your urine pale yellow. Do not wear a rib belt or binder. These restrict breathing, which can lead to pneumonia. Keep all follow-up visits as told by your health care provider. This is important. Contact a health care provider if: You have a fever. Get help right away if: You have difficulty breathing or you are short of breath. You develop a cough that does not stop, or you cough up thick or bloody sputum. You have nausea, vomiting, or pain in your abdomen. Your pain gets worse and medicine does not help. Summary A rib fracture is a break or crack in one of the bones of the ribs. A broken or cracked rib is   often painful but is not usually serious. Most rib fractures heal on their own over time. Treatment for this condition depends on the severity of the fracture. Avoid a lot of activity and any activities or movements that cause pain. This information is not intended to replace advice given to you by your health care provider. Make sure you discuss any questions you have with your health care provider. Document Released: 09/27/2005 Document Revised: 12/27/2016 Document Reviewed: 12/27/2016 Elsevier Interactive Patient Education  2019 Elsevier Inc.  
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
## 2022-08-02 NOTE — Telephone Encounter (Signed)
Patient had a traumatic pheumothorax last week, 2 view Xray was completed in ER on 07/27/22. He has not completed hep C RNA nor repeat quant TB labs.

## 2022-08-05 ENCOUNTER — Telehealth: Payer: Self-pay | Admitting: Neurology

## 2022-08-05 NOTE — Telephone Encounter (Signed)
Pt called confused thinking he was needing to schedule an appt or getting on a new MS medication. Pt would like to discuss with RN.

## 2022-08-05 NOTE — Telephone Encounter (Signed)
Spoke with Dr. Felecia Shelling. He reviewed CT chest and xray done at hospital. He would still like him to complete hep C RNA and quant TB labs. I called pt and let him know. He verbalized understanding and will come 08/09/22 for these.

## 2022-08-07 ENCOUNTER — Inpatient Hospital Stay: Admission: RE | Admit: 2022-08-07 | Payer: 59 | Source: Ambulatory Visit

## 2022-08-16 NOTE — Telephone Encounter (Signed)
Patient has not completed his hep C RNA and quant TB labs.  I called patient to discuss.  No answer, voicemail is full at this time.

## 2022-08-23 NOTE — Telephone Encounter (Signed)
I called patient again to remind him to complete his labs.  No answer, voicemail was full at this time.

## 2022-08-30 ENCOUNTER — Other Ambulatory Visit: Payer: Self-pay | Admitting: *Deleted

## 2022-08-30 ENCOUNTER — Telehealth: Payer: Self-pay | Admitting: Neurology

## 2022-08-30 DIAGNOSIS — G35 Multiple sclerosis: Secondary | ICD-10-CM

## 2022-08-30 MED ORDER — AMPHETAMINE-DEXTROAMPHETAMINE 20 MG PO TABS: 20.0000 mg | ORAL_TABLET | Freq: Two times a day (BID) | ORAL | 0 refills | Status: DC

## 2022-08-30 NOTE — Telephone Encounter (Signed)
I called patient again to remind him to complete his labs. No answer, cell number appears to be out of service. I will mail him a letter asking him to call us back.  Patient needs to be reminded to complete his HCV RNA and TB labs prior to starting a DMT such as Ocrevus.

## 2022-08-30 NOTE — Telephone Encounter (Signed)
Pt is requesting a refill for amphetamine-dextroamphetamine (ADDERALL) 20 MG tablet .  Pharmacy: [facility] [id number]  

## 2022-08-30 NOTE — Telephone Encounter (Signed)
Pt has been called and was offered the very next available appointment date with Dr Epimenio Foot, and with the wait list.

## 2022-08-30 NOTE — Telephone Encounter (Signed)
Gabriel Burns, I sent Dr. Epimenio Foot the refill request so he can send it in. Can you please call the pt back to schedule a follow up? He wanted to see him back around 10/24/22. He has no f/u scheduled at this time. Thank you

## 2022-09-06 NOTE — Telephone Encounter (Signed)
See other telephone note. Patient has not completed labs as requested by Dr. Epimenio Foot prior to starting DMT. Have called patient unsuccessfully x 3 and sent patient a letter.

## 2022-09-30 ENCOUNTER — Emergency Department (HOSPITAL_COMMUNITY)
Admission: EM | Admit: 2022-09-30 | Discharge: 2022-09-30 | Discharge status: Against Medical Advice | Payer: 59 | Attending: Emergency Medicine | Admitting: Emergency Medicine

## 2022-09-30 ENCOUNTER — Emergency Department (HOSPITAL_COMMUNITY): Payer: 59

## 2022-09-30 DIAGNOSIS — R0781 Pleurodynia: Secondary | ICD-10-CM | POA: Insufficient documentation

## 2022-09-30 DIAGNOSIS — W208XXA Other cause of strike by thrown, projected or falling object, initial encounter: Secondary | ICD-10-CM | POA: Insufficient documentation

## 2022-09-30 DIAGNOSIS — Z5321 Procedure and treatment not carried out due to patient leaving prior to being seen by health care provider: Secondary | ICD-10-CM | POA: Insufficient documentation

## 2022-09-30 LAB — CBC
HCT: 41 % (ref 39.0–52.0)
Hemoglobin: 13.8 g/dL (ref 13.0–17.0)
MCH: 31.9 pg (ref 26.0–34.0)
MCHC: 33.7 g/dL (ref 30.0–36.0)
MCV: 94.7 fL (ref 80.0–100.0)
Platelets: 241 10*3/uL (ref 150–400)
RBC: 4.33 MIL/uL (ref 4.22–5.81)
RDW: 13.5 % (ref 11.5–15.5)
WBC: 6.2 10*3/uL (ref 4.0–10.5)
nRBC: 0 % (ref 0.0–0.2)

## 2022-09-30 LAB — BASIC METABOLIC PANEL
Anion gap: 10 (ref 5–15)
BUN: 8 mg/dL (ref 6–20)
CO2: 22 mmol/L (ref 22–32)
Calcium: 9.1 mg/dL (ref 8.9–10.3)
Chloride: 99 mmol/L (ref 98–111)
Creatinine, Ser: 0.95 mg/dL (ref 0.61–1.24)
GFR, Estimated: >60 mL/min (ref >60)
Glucose, Bld: 109 mg/dL — ABNORMAL HIGH (ref 70–99)
Potassium: 3.9 mmol/L (ref 3.5–5.1)
Sodium: 131 mmol/L — ABNORMAL LOW (ref 135–145)

## 2022-09-30 NOTE — ED Provider Triage Note (Signed)
Emergency Medicine Provider Triage Evaluation Note  Gabriel Burns , a 51 y.o. male  was evaluated in triage.  Pt complains of right chest wall pain that occurred yesterday.  He states that a tree branch fell on his right side and since then he has been having pain.  Patient does endorse a history of MS. pain is worse with positional movements and with respirations.   Review of Systems  Positive:  Negative: See above   Physical Exam  BP (!) 147/104 (BP Location: Right Arm)   Pulse (!) 109   Temp 98.6 F (37 C) (Oral)   Resp 16   SpO2 92%  Gen:   Awake, no distress   Resp:  Normal effort  MSK:   Moves extremities without difficulty Other:  Right side chest wall tenderness  Medical Decision Making  Medically screening exam initiated at 11:44 AM.  Appropriate orders placed.  Gabriel Burns was informed that the remainder of the evaluation will be completed by another provider, this initial triage assessment does not replace that evaluation, and the importance of remaining in the ED until their evaluation is complete.     Honor Loh [place], [place] 09/30/22 1146

## 2022-09-30 NOTE — ED Notes (Signed)
Called for vitals, no answer 

## 2022-09-30 NOTE — ED Notes (Signed)
Called 3x  for vs check. no response

## 2022-09-30 NOTE — ED Triage Notes (Signed)
Patient BIB [facility] from home for evaluation of rib pain after dropping 6x6 inch wooden post onto his chest that he was holding over his head. Patient is alert, oriented, and in no apparent distress at this time.  BP 168/88 HR 106 96% on room air CBG 108

## 2022-10-22 ENCOUNTER — Other Ambulatory Visit: Payer: Self-pay | Admitting: Neurology

## 2022-10-22 DIAGNOSIS — G35 Multiple sclerosis: Secondary | ICD-10-CM

## 2022-10-22 MED ORDER — AMPHETAMINE-DEXTROAMPHETAMINE 20 MG PO TABS: 20.0000 mg | ORAL_TABLET | Freq: Two times a day (BID) | ORAL | 0 refills | Status: DC

## 2022-10-29 ENCOUNTER — Ambulatory Visit
Admission: RE | Admit: 2022-10-29 | Discharge: 2022-10-29 | Disposition: A | Discharge status: Home / Self Care | Payer: 59 | Source: Ambulatory Visit | Attending: Family | Admitting: Family

## 2022-10-29 ENCOUNTER — Other Ambulatory Visit: Payer: Self-pay | Admitting: Family

## 2022-10-29 DIAGNOSIS — R0781 Pleurodynia: Secondary | ICD-10-CM

## 2022-11-10 ENCOUNTER — Other Ambulatory Visit (INDEPENDENT_AMBULATORY_CARE_PROVIDER_SITE_OTHER): Payer: Self-pay

## 2022-11-10 DIAGNOSIS — Z0289 Encounter for other administrative examinations: Secondary | ICD-10-CM

## 2022-11-10 DIAGNOSIS — R768 Other specified abnormal immunological findings in serum: Secondary | ICD-10-CM

## 2022-11-10 DIAGNOSIS — R7612 Nonspecific reaction to cell mediated immunity measurement of gamma interferon antigen response without active tuberculosis: Secondary | ICD-10-CM

## 2022-11-16 LAB — QUANTIFERON-TB GOLD PLUS
QuantiFERON Mitogen Value: >10 IU/mL
QuantiFERON Nil Value: 0 IU/mL
QuantiFERON TB1 Ag Value: 0.28 IU/mL
QuantiFERON TB2 Ag Value: 0.3 IU/mL
QuantiFERON-TB Gold Plus: NEGATIVE

## 2022-11-16 LAB — HCV RNA QUANT: Hepatitis C Quantitation: NOT DETECTED IU/mL

## 2022-11-17 ENCOUNTER — Other Ambulatory Visit: Payer: Self-pay

## 2022-11-17 ENCOUNTER — Telehealth: Payer: Self-pay | Admitting: Neurology

## 2022-11-17 DIAGNOSIS — G35 Multiple sclerosis: Secondary | ICD-10-CM

## 2022-11-17 MED ORDER — AMPHETAMINE-DEXTROAMPHETAMINE 20 MG PO TABS: 20.0000 mg | ORAL_TABLET | Freq: Two times a day (BID) | ORAL | 0 refills | Status: DC

## 2022-11-17 NOTE — Telephone Encounter (Signed)
Pt request refill for  amphetamine-dextroamphetamine (ADDERALL) 20 MG tablet at [facility] [phone]

## 2022-11-17 NOTE — Telephone Encounter (Signed)
Patient follow up visit scheduled on 01/25/23  [facility] sent refill request to Dr.Sater.

## 2022-11-22 ENCOUNTER — Telehealth: Payer: Self-pay

## 2022-11-22 NOTE — Telephone Encounter (Signed)
Please see message from today 11/22/2022 @3$ :47pm

## 2022-12-01 ENCOUNTER — Telehealth: Payer: Self-pay | Admitting: Neurology

## 2022-12-01 ENCOUNTER — Telehealth: Payer: Self-pay | Admitting: *Deleted

## 2022-12-01 MED ORDER — GABAPENTIN 300 MG PO CAPS: ORAL_CAPSULE | ORAL | 5 refills | Status: DC

## 2022-12-01 NOTE — Telephone Encounter (Addendum)
Ocrevus infusion start form faxed to [phone]  Orders given to [facility]/infusion suite for Ocrevus 300 IV in 250 ml NS over 2.5 hours on week 0 and week 2
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
## 2022-12-01 NOTE — Telephone Encounter (Signed)
Pt is calling stated the medication he is taking isn't strong enough because he haven't got any sleep in a month. Pt stated he have to take three Gabapentin to get a little sleep. Pt is requesting a call back to discuss.

## 2022-12-01 NOTE — Telephone Encounter (Signed)
Called pt to get more info. I do not see gabapentin on med list or where Dr. Felecia Shelling has prescribed. He reports he cannot sleep for the past month. States previous neurologist at [facility] prescribed gabapentin 396m. Currently taking 3 at bed. Has electrical pain in arms/legs that is a chronic issue. Wondering if Dr. SFelecia Shellinghas any recommendations to help with sleep issues.

## 2022-12-01 NOTE — Addendum Note (Signed)
Addended by: Thamas Jaegers on: 12/01/2022 04:49 PM   Modules accepted: Orders

## 2022-12-15 NOTE — Telephone Encounter (Signed)
Pt is asking for a call re: when he will start Ocrevus

## 2022-12-15 NOTE — Telephone Encounter (Signed)
Called pt and let him know that the Infusion team is waiting on his insurance to approve so that he can start [place]. Pt and pt's wife became argumentative saying that they received a letter saying that pt was already approved,told pt to bring the letter to the office so that we may confirm the letter. Pt said that he will bring the letter later on today.

## 2022-12-16 ENCOUNTER — Other Ambulatory Visit: Payer: Self-pay | Admitting: Neurology

## 2022-12-16 DIAGNOSIS — G35 Multiple sclerosis: Secondary | ICD-10-CM

## 2022-12-16 MED ORDER — AMPHETAMINE-DEXTROAMPHETAMINE 20 MG PO TABS: 20.0000 mg | ORAL_TABLET | Freq: Two times a day (BID) | ORAL | 0 refills | Status: DC

## 2022-12-16 NOTE — Telephone Encounter (Signed)
Pt is requesting a refill for amphetamine-dextroamphetamine (ADDERALL) 20 MG tablet .  Pharmacy: [facility] QI:[id number]

## 2022-12-16 NOTE — Telephone Encounter (Signed)
Pt last seen on 06/24/22 Follow up scheduled on 01/25/23 Rx last filled on 11/20/22 #60 ( 30 tablets)  Rx pending to be signed

## 2022-12-21 NOTE — Telephone Encounter (Signed)
Spoke w. Holly/intrafusion. She was waiting on pt to email copy of insurance cards. She was provided pt email: [email or web address]. She will f/u with pt. She had a different email for him.

## 2022-12-21 NOTE — Telephone Encounter (Signed)
Pt calling to leave email address as instructed by the nurse Email address: [email or web address]

## 2023-01-13 ENCOUNTER — Other Ambulatory Visit: Payer: Self-pay

## 2023-01-13 ENCOUNTER — Other Ambulatory Visit: Payer: Self-pay | Admitting: Neurology

## 2023-01-13 DIAGNOSIS — G35 Multiple sclerosis: Secondary | ICD-10-CM

## 2023-01-13 MED ORDER — AMPHETAMINE-DEXTROAMPHETAMINE 20 MG PO TABS: 20.0000 mg | ORAL_TABLET | Freq: Two times a day (BID) | ORAL | 0 refills | Status: DC

## 2023-01-13 MED ORDER — BACLOFEN 10 MG PO TABS: 10.0000 mg | ORAL_TABLET | Freq: Three times a day (TID) | ORAL | 0 refills | Status: DC

## 2023-01-13 NOTE — Telephone Encounter (Signed)
Pt called needing a refill request for his  amphetamine-dextroamphetamine (ADDERALL) 20 MG tablet  gabapentin (NEURONTIN) 300 MG capsule  baclofen (LIORESAL) 10 MG tablet   To be sent to the [facility] on [address].

## 2023-01-13 NOTE — Telephone Encounter (Signed)
Called pt about refill request. Pt was last seen 06/24/2022 and has an upcoming appointment 01/25/2023. Sent in Pt Baclofen today 01/13/2023, Gabapentin was sent in 12/01/2022 with 5 refills, sent Dr. Felecia Shelling the refill for pt's Adderall. Told pt to call his pharmacy about his Gabapentin to get it refilled. Pt verbalized understanding.

## 2023-01-25 ENCOUNTER — Ambulatory Visit (INDEPENDENT_AMBULATORY_CARE_PROVIDER_SITE_OTHER): Payer: 59 | Admitting: Neurology

## 2023-01-25 ENCOUNTER — Encounter: Payer: Self-pay | Admitting: Neurology

## 2023-01-25 VITALS — BP 128/80 | HR 69 | Ht 70.0 in | Wt 158.5 lb

## 2023-01-25 DIAGNOSIS — N3941 Urge incontinence: Secondary | ICD-10-CM

## 2023-01-25 DIAGNOSIS — F988 Other specified behavioral and emotional disorders with onset usually occurring in childhood and adolescence: Secondary | ICD-10-CM

## 2023-01-25 DIAGNOSIS — F3342 Major depressive disorder, recurrent, in full remission: Secondary | ICD-10-CM

## 2023-01-25 DIAGNOSIS — Z79899 Other long term (current) drug therapy: Secondary | ICD-10-CM | POA: Diagnosis not present

## 2023-01-25 DIAGNOSIS — R269 Unspecified abnormalities of gait and mobility: Secondary | ICD-10-CM

## 2023-01-25 DIAGNOSIS — G35 Multiple sclerosis: Secondary | ICD-10-CM

## 2023-01-25 MED ORDER — OXYBUTYNIN CHLORIDE ER 10 MG PO TB24: 10.0000 mg | ORAL_TABLET | Freq: Every day | ORAL | 11 refills | Status: DC

## 2023-01-25 MED ORDER — AMANTADINE HCL 100 MG PO CAPS: 100.0000 mg | ORAL_CAPSULE | Freq: Two times a day (BID) | ORAL | 11 refills | Status: AC

## 2023-01-25 NOTE — Progress Notes (Signed)
[facility]  PATIENT: Gabriel Burns DOB: Feb 29, 1971  REFERRING DOCTOR OR PCP: Fatima Sanger, FNP SOURCE: Patient, notes from primary care, notes from [facility] neurology, imaging and lab reports, MRI images personally reviewed.  _________________________________   HISTORICAL  CHIEF COMPLAINT:  Chief Complaint  Patient presents with   Follow-up    Pt in room 10.Here for MS follow up. Pt still has blurry vision, walks with cane,  pt fell this week at home outside and hit head did not go to ER. Pt said Baclofen is not helping, takes 2 tablets.     HISTORY OF PRESENT ILLNESS:  I had the pleasure of seeing patient, Gabriel Burns, at the [facility] at [facility] for neurologic consultation regarding his multiple sclerosis.  Update 01/25/2023 He is going to start Ocrevus but he is having some issues getting the next infusion set up.   I spoke to the infusion center and they feel he will be getting started very soon.     He is noting more issues with urinary urgency with occasional incontinence.  He also feels his depression is doing worse.  Physically, he feels he is fairly stable compared to his last visit.   He has right > left, leg > arm weakness and right > left spasticity.  He does not feel the baclofen has helped the spasticity much.  He tried 3 times a day.  Gait is poor.    He has an right AFO and uses a cane.   He can only go about 100-150 feet without a break.   He has dysesthetic pain in right > left leg and the right arm.   He has pixilated vision' if he is hot or walks longer.  He needs reading glasses.     He has urinary urgency and occasional urge incontinence and occasional fecal incontinence (with urge first).   He has ED.Marland Kitchen  He was once on oxybutynin and feels it helped him some.  He is no longer on that.  He has fatigue, much worse with heat.   He gets so tired in heat that he can't rise from the ground without help.   He sleeps  poorly at night with difficulty falling and staying asleep.    No naps.   No EDS.    He snores some but no OSA signs.    He has depression.   He is on venlafaxine and buproprion.    He has difficulty with completion. He starts projects but never finishes.  He has reduced memory, helped with hints  He has back pain and pain that radiates down the leg.  He has a history of hepatitis C that was treated.  PCR 11/08/2019 was negative.  MS History: He was diagnosed with MS in 2010.  At the time he had right leg weakness and poor gait.    He had milder weakness on the right arm.   He saw Dr. Ronalee Red and then Dr. Elwyn Reach.  He was placed on Copaxone.  He had more exacerbations with weakness and phasic spasms in his legs.   He had a lot of steroid treatments and was switched to Tysabri.   He didn't think he did well on it.  A couple years ago he went on Kesimpta but had some progression.   He has not been on it nearly two years.     He has been seen at [facility] most recently.  (Dr. Elwyn Reach and Dr. Ronalee Red).  Imaging personally reviewed:  MRI of the thoracic spine 02/16/2016 shows T2 hyperintense foci at T6-T7, T8-T9 and T11-T12.  Mild degenerative changes but no spinal stenosis  MRI of the cervical spine 02/13/2016 shows multiple T2/FLAIR hyperintense foci with discrete lesions anteriorly adjacent to C2-C3, to the right adjacent to C3, anterior centrally adjacent to C5, posteriorly adjacent to C6,  Of the brain 02/13/2016 and 07/04/2014 shows multiple T2/FLAIR hyperintense foci.  There are confluencies in the periatrial white matter.  Some of the foci are radially oriented to the ventricles.  Some foci are in the juxtacortical white matter.  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  No definite infratentorial foci were noted..  No enhancing lesions.  Possibly some progression of the periatrial white matter foci between 2015 and 201 (or just due to differences in technique).  MRI of the lumbar  spine 05/26/2017 shows disc bulging and borderline spinal stenosis at L3-L4 with mild foraminal and lateral recess stenosis but no nerve root compression.  At L4-L5, he has a right paramedian disc protrusion with moderate foraminal narrowing right greater than left moderately severe lateral recess stenosis.  Laboratory: Oligoclonal bands 02/15/2012 were positive at 12. Hepatitis C PCR was positive in 2020 and negative in 2021 after treatment. 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  HIV was negative.  REVIEW OF SYSTEMS: Constitutional: No fevers, chills, sweats, or change in appetite.  He has fatigue Eyes: No visual changes, double vision, eye pain Ear, nose and throat: No hearing loss, ear pain, nasal congestion, sore throat Cardiovascular: No chest pain, palpitations Respiratory:  No shortness of breath at rest or with exertion.   No wheezes GastrointestinaI: No nausea, vomiting, diarrhea, abdominal pain, fecal incontinence Genitourinary:  No dysuria, urinary retention or frequency.  No nocturia. Musculoskeletal: He has back pain and some radiating pain down the leg Integumentary: No rash, pruritus, skin lesions Neurological: as above Psychiatric: No depression at this time.  No anxiety Endocrine: No palpitations, diaphoresis, change in appetite, change in weigh or increased thirst Hematologic/Lymphatic:  No anemia, purpura, petechiae. Allergic/Immunologic: No itchy/runny eyes, nasal congestion, recent allergic reactions, rashes  ALLERGIES: No Known Allergies  HOME MEDICATIONS:  Current Outpatient Medications:    acetaminophen (TYLENOL) 500 MG tablet, Take 2 tablets (1,000 mg total) by mouth every 6 (six) hours., Disp: 30 tablet, Rfl: 0   amantadine (SYMMETREL) 100 MG capsule, Take 1 capsule (100 mg total) by mouth 2 (two) times daily., Disp: 60 capsule, Rfl: 11   amphetamine-dextroamphetamine (ADDERALL) 20 MG tablet, Take 1 tablet (20 mg total) by mouth 2 (two) times daily., Disp: 60  tablet, Rfl: 0   baclofen (LIORESAL) 10 MG tablet, Take 1 tablet (10 mg total) by mouth 3 (three) times daily., Disp: 90 each, Rfl: 0   buPROPion (WELLBUTRIN SR) 150 MG 12 hr tablet, Take 150 mg by mouth 2 (two) times daily., Disp: , Rfl:    finasteride (PROSCAR) 5 MG tablet, Take 5 mg by mouth daily., Disp: , Rfl:    gabapentin (NEURONTIN) 300 MG capsule, Take 900 mg by mouth at bedtime., Disp: , Rfl:    gabapentin (NEURONTIN) 300 MG capsule, Take one tablet daily in the morning, take one tablet daily in afternoon, and take 3 tablets nightly., Disp: 150 capsule, Rfl: 5   oxybutynin (DITROPAN XL) 10 MG 24 hr tablet, Take 1 tablet (10 mg total) by mouth at bedtime., Disp: 30 tablet, Rfl: 11   oxyCODONE (OXY IR/ROXICODONE) 5 MG immediate release tablet, Take 5 mg  by mouth every 6 (six) hours as needed for severe pain., Disp: , Rfl:    venlafaxine XR (EFFEXOR-XR) 150 MG 24 hr capsule, Take 150 mg by mouth daily., Disp: , Rfl:   PAST MEDICAL HISTORY: Past Medical History:  Diagnosis Date   Chronic lower back pain    COPD (chronic obstructive pulmonary disease)    Depression    History of kidney stones    Migraine    "2-4 times/year" (08/30/2017)   Multiple sclerosis    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   Spinal headache    woke up during procedure    PAST SURGICAL HISTORY: Past Surgical History:  Procedure Laterality Date   BACK SURGERY     EPIDURAL BLOOD PATCH  ~ 11//2018   "for CSF leak"   LITHOTRIPSY     LUMBAR LAMINECTOMY  07/24/2017   "took piece off vertebrae; had a bulging herniated disc"   LUMBAR WOUND DEBRIDEMENT N/A 09/07/2017   Procedure: Lumbar wound exploration with placement of lumbar drain;  Surgeon: Ditty, Loura Halt, MD;  Location: [facility] OR;  Service: Neurosurgery;  Laterality: N/A;   PLACEMENT OF LUMBAR DRAIN N/A 09/07/2017   Procedure: PLACEMENT OF LUMBAR DRAIN;  Surgeon: Ditty, Loura Halt, MD;  Location: [facility] OR;  Service: Neurosurgery;  Laterality: N/A;   REPAIR OF  CEREBROSPINAL FLUID LEAK N/A 08/30/2017   Procedure: 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;  Surgeon: Ditty, Loura Halt, MD;  Location: [facility] OR;  Service: Neurosurgery;  Laterality: N/A;  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   WOUND EXPLORATION  08/30/2017   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/notes 08/30/2017    FAMILY HISTORY: Family History  Problem Relation Age of Onset   Hypertension Father    Alcohol abuse Paternal Grandfather     SOCIAL HISTORY: Social History   Socioeconomic History   Marital status: Divorced    Spouse name: Not on file   Number of children: 3   Years of education: GED   Highest education level: Not on file  Occupational History   Occupation: Disability     Comment: MS  Tobacco Use   Smoking status: Every Day    Packs/day: 1.00    Years: 30.00    Additional pack years: 0.00    Total pack years: 30.00    Types: Cigars, Cigarettes   Smokeless tobacco: Never  Vaping Use   Vaping Use: Never used  Substance and Sexual Activity   Alcohol use: No   Drug use: Yes    Types: Marijuana    Comment: 08/30/2017 "couple times/day"   Sexual activity: Yes  Other Topics Concern   Not on file  Social History Narrative   Fun: Draw (anything), realism.   Denies religious beliefs effecting health care.    Social Determinants of Health   Financial Resource Strain: Not on file  Food Insecurity: Not on file  Transportation Needs: Not on file  Physical Activity: Not on file  Stress: Not on file  Social Connections: Not on file  Intimate Partner Violence: Not on file       PHYSICAL EXAM  Vitals:   01/25/23 1125  BP: 128/80  Pulse: 69  Weight: 158 lb 8 oz (71.9 kg)  Height:  (1.778 m)    Body mass index is 22.74 kg/m.   General: The patient is well-developed and well-nourished and in no acute distress  HEENT:  Head is [place]/AT.  Sclera are anicteric.     Skin:  Extremities are without rash or  edema.   Neurologic Exam  Mental status: The patient is alert and oriented x 3 at the time of the examination. The patient has apparent normal recent and remote memory, with an apparently normal attention span and concentration ability.   Speech is normal.  Cranial nerves: He has a bilateral partial INO  .  Color vision is symmetric..  Facial symmetry is present. There is good facial sensation to soft touch bilaterally.Facial strength is normal.  Trapezius and sternocleidomastoid strength is normal. No dysarthria is noted.  No obvious hearing deficits are noted.  Motor:  Muscle bulk is normal.   Tone is increased in right > left leg and in right arm. Strength is  5 / 5 in left arm 4/5 right arm and left leg and 3/5 proximal right leg and 2/5 distal leg.   Reduced right RAM  Sensory: Reduced vibration in right  leg.  Coordination: Cerebellar testing reveals good finger-nose-finger and heel-to-shin bilaterally.  Gait and station: Station is normal.   Spastic gait with right leg drop.   Cannot tandem. Romberg is negative.   Reflexes: Deep tendon reflexes are increased in rght arm and both legs, right > left.   Nonsustained right anle clonus.  Spread at right knee. .   Plantar responses are flexor.    DIAGNOSTIC DATA (LABS, IMAGING, TESTING) - I reviewed patient records, labs, notes, testing and imaging myself where available.  Lab Results  Component Value Date   WBC 6.2 09/30/2022   HGB 13.8 09/30/2022   HCT 41.0 09/30/2022   MCV 94.7 09/30/2022   PLT 241 09/30/2022      Component Value Date/Time   NA 131 (L) 09/30/2022 1118   NA 141 06/24/2022 1435   K 3.9 09/30/2022 1118   CL 99 09/30/2022 1118   CO2 22 09/30/2022 1118   GLUCOSE 109 (H) 09/30/2022 1118   BUN 8 09/30/2022 1118   BUN 11 06/24/2022 1435   CREATININE 0.95 09/30/2022 1118   CALCIUM 9.1 09/30/2022 1118   PROT 7.4 06/24/2022 1435   ALBUMIN 4.7 06/24/2022 1435   AST 20 06/24/2022  1435   ALT 12 06/24/2022 1435   ALKPHOS 119 06/24/2022 1435   BILITOT 0.5 06/24/2022 1435   GFRNONAA >60 09/30/2022 1118   GFRAA >60 08/31/2017 0329        ASSESSMENT AND PLAN  MS (multiple sclerosis)  Gait disturbance  Recurrent major depressive disorder, in full remission  High risk medication use  Attention deficit disorder, unspecified hyperactivity presence  Urge incontinence  I spoke to infusion and the Ocrevus should be started soon Continue Adderall and add amantadine Oxybutynin XL 10 mg po qd 4.   Try to stay active and exercise as tolerated. 5.   Return in 6 months or sooner if there are new or worsening neurologic symptoms.   40-minute office visit with the majority of the time spent face-to-face for history and physical, discussion/counseling and decision-making.  Additional time with record review and documentation.  Shaunie Boehm A. Epimenio Foot, MD, [facility] 01/25/2023, 11:50 AM Certified in Neurology, Clinical Neurophysiology, Sleep Medicine and Neuroimaging  [facility] [address], [address] [place], [place] [address] [phone]

## 2023-02-14 ENCOUNTER — Other Ambulatory Visit: Payer: Self-pay | Admitting: Neurology

## 2023-02-14 DIAGNOSIS — G35 Multiple sclerosis: Secondary | ICD-10-CM

## 2023-02-14 MED ORDER — BACLOFEN 10 MG PO TABS: 10.0000 mg | ORAL_TABLET | Freq: Three times a day (TID) | ORAL | 5 refills | Status: AC

## 2023-02-14 MED ORDER — AMPHETAMINE-DEXTROAMPHETAMINE 20 MG PO TABS: 20.0000 mg | ORAL_TABLET | Freq: Two times a day (BID) | ORAL | 0 refills | Status: DC

## 2023-02-14 NOTE — Telephone Encounter (Signed)
Last seen 01/25/23 and next f/u 08/02/23.  Last refilled adderall 20mg  01/14/23 #60.   E-scribed rx baclofen to pharmacy.

## 2023-02-14 NOTE — Telephone Encounter (Signed)
Pt requesting a refill on  amphetamine-dextroamphetamine (ADDERALL) 20 MG tablet and baclofen (LIORESAL) 10 MG tablet. Should be sent to [facility] [phone]

## 2023-03-17 ENCOUNTER — Other Ambulatory Visit: Payer: Self-pay | Admitting: Neurology

## 2023-03-17 DIAGNOSIS — G35 Multiple sclerosis: Secondary | ICD-10-CM

## 2023-03-17 MED ORDER — AMPHETAMINE-DEXTROAMPHETAMINE 20 MG PO TABS: 20.0000 mg | ORAL_TABLET | Freq: Two times a day (BID) | ORAL | 0 refills | Status: DC

## 2023-03-17 NOTE — Telephone Encounter (Signed)
Pt called needing a refill request sent in to the [facility] on [address] for his amphetamine-dextroamphetamine (ADDERALL) 20 MG tablet

## 2023-03-17 NOTE — Telephone Encounter (Signed)
Last seen 01/25/23 and next f/u 08/02/23. Last refilled 02/14/23 #60.

## 2023-04-18 ENCOUNTER — Telehealth: Payer: Self-pay | Admitting: Neurology

## 2023-04-18 NOTE — Telephone Encounter (Signed)
Pt requesting a refill on gabapentin (NEURONTIN) 300 MG capsule. Refill should be sent to [facility] [id number]

## 2023-04-18 NOTE — Telephone Encounter (Signed)
Last seen on 01/25/23 Follow up scheduled on 08/02/23 Last filled on 04/07/23 #150 tablets (30 day supply)   I called patient to relay he just filled 30 day supply on 04/07/23 its too soon for refills. He should still have medication left from last refill. I called home # and voicemail was full, and I called cell and a message came on saying " the phone is no longer in service"

## 2023-04-21 ENCOUNTER — Other Ambulatory Visit: Payer: Self-pay | Admitting: Neurology

## 2023-04-21 DIAGNOSIS — G35 Multiple sclerosis: Secondary | ICD-10-CM

## 2023-04-21 NOTE — Telephone Encounter (Signed)
Pt is requesting a refill for amphetamine-dextroamphetamine (ADDERALL) 20 MG tablet .  Pharmacy: [facility] [id number]  

## 2023-04-21 NOTE — Telephone Encounter (Signed)
Dr.Camara you are work in pm, Dr.Sater is out of the office  Last seen on 01/25/23 per note "Continue Adderall and add amantadine " Follow up scheduled on 08/02/23 Last filled on 03/17/23 #60 tablets (30 day supply) Rx pending to be signed

## 2023-04-22 MED ORDER — AMPHETAMINE-DEXTROAMPHETAMINE 20 MG PO TABS: 20.0000 mg | ORAL_TABLET | Freq: Two times a day (BID) | ORAL | 0 refills | Status: DC

## 2023-06-04 ENCOUNTER — Other Ambulatory Visit: Payer: Self-pay | Admitting: Neurology

## 2023-07-27 ENCOUNTER — Other Ambulatory Visit: Payer: Self-pay | Admitting: Neurology

## 2023-07-27 DIAGNOSIS — G35 Multiple sclerosis: Secondary | ICD-10-CM

## 2023-07-27 MED ORDER — AMPHETAMINE-DEXTROAMPHETAMINE 20 MG PO TABS: 20.0000 mg | ORAL_TABLET | Freq: Two times a day (BID) | ORAL | 0 refills | Status: DC

## 2023-07-27 NOTE — Telephone Encounter (Signed)
Pt has called back stating the initial request was not correct, he is needing :amphetamine-dextroamphetamine (ADDERALL) 20 MG tablet,baclofen (LIORESAL) 10 MG tablet,gabapentin (NEURONTIN) 300 MG capsule,oxybutynin (DITROPAN XL) 10 MG 24 hr tablet  all called into [facility] [phone]

## 2023-07-27 NOTE — Telephone Encounter (Signed)
Last seen on 01/25/23 Follow up scheduled on 08/02/23 Last filled on 04/22/23 #60 tablets (30 day supply) Rx pending to be signed

## 2023-07-27 NOTE — Telephone Encounter (Signed)
Pt request refill for amphetamine-dextroamphetamine (ADDERALL) 20 MG tablet sent to [facility] [id number]

## 2023-07-28 NOTE — Telephone Encounter (Signed)
Called pt cell # and the phone received a message that number you reached is not working. Please call back later.   I called to speak directly to patient to figure out which pharmacy he is going to be using the adderall was already sent to [facility] on 07/27/23 per pt original request. We received another call stating he wants Rx sent sent to another pharmacy.   I wanted to speak with him to confirm primary pharmacy

## 2023-07-28 NOTE — Addendum Note (Signed)
Addended by: Aura Camps on: 07/28/2023 07:40 AM   Modules accepted: Orders

## 2023-08-02 ENCOUNTER — Encounter: Payer: Self-pay | Admitting: Neurology

## 2023-08-02 ENCOUNTER — Ambulatory Visit: Payer: 59 | Admitting: Neurology

## 2023-10-03 ENCOUNTER — Encounter: Payer: Self-pay | Admitting: Neurology

## 2023-10-03 ENCOUNTER — Ambulatory Visit: Payer: 59 | Admitting: Neurology

## 2023-10-25 ENCOUNTER — Telehealth: Payer: Self-pay | Admitting: Neurology

## 2023-10-25 NOTE — Telephone Encounter (Addendum)
 Pt is requesting a refill for gabapentin (NEURONTIN) 300 MG capsule & amphetamine-dextroamphetamine (ADDERALL) 20 MG tablet  .  Pharmacy: [facility] [phone]

## 2023-10-25 NOTE — Telephone Encounter (Signed)
 Last seen 01/25/23 and has no f/u scheduled. He no showed 08/02/23 and 10/03/23 appt.  Phone room: please call back and let pt know he will need to be seen for refills and schedule appt, thank you!

## 2023-10-26 NOTE — Telephone Encounter (Signed)
 Advised pt of previous note. Pt states he lives in [place] and is totally disabled. States he will get no sleep without these medications. Asking if a VV is an option for him. Requesting call back

## 2023-10-31 ENCOUNTER — Encounter: Payer: Self-pay | Admitting: *Deleted

## 2023-10-31 NOTE — Telephone Encounter (Signed)
Called father on DPR and he stated that he can give a number to be able to reach his son but that he did not have it on him but he will call back with the number in a while.

## 2023-10-31 NOTE — Telephone Encounter (Signed)
Sent pt letter since unreachable by phone. 

## 2023-10-31 NOTE — Telephone Encounter (Signed)
Pt's father called back to provide phone number: [phone]

## 2023-10-31 NOTE — Telephone Encounter (Signed)
Called pt at [phone]. Mailbox full, unable to LVM

## 2023-10-31 NOTE — Telephone Encounter (Signed)
FYI-Called pt to schedule an appt but there was no answer and VM is full.

## 2023-10-31 NOTE — Telephone Encounter (Signed)
Can you also try to call who is on DPR? Thank you!

## 2023-11-30 NOTE — Telephone Encounter (Signed)
[facility] sent email about pt getting set up for home infusions. I relayed pt has been unreachable, needs updated appt first. They state updated phone# [phone]. Relayed this is # we have tried. I tried calling again and went to VM, VM full and unable to LVM.

## 2023-12-13 ENCOUNTER — Telehealth: Payer: Self-pay | Admitting: Neurology

## 2023-12-13 ENCOUNTER — Other Ambulatory Visit: Payer: Self-pay | Admitting: Neurology

## 2023-12-13 DIAGNOSIS — G35 Multiple sclerosis: Secondary | ICD-10-CM

## 2023-12-13 NOTE — Telephone Encounter (Signed)
 Phone room: please call pt and let him know he will need to be seen first before getting refills

## 2023-12-13 NOTE — Telephone Encounter (Signed)
 ..  Pt understands that although there may be some limitations with this type of visit, we will take all precautions to reduce any security or privacy concerns.  Pt understands that this will be treated like an in office visit and we will file with pt's insurance, and there may be a patient responsible charge related to this service. ? ?

## 2023-12-13 NOTE — Telephone Encounter (Signed)
 Pt last seen 01/25/23 and next f/u with MM,NP 12/23/23.  He no showed 08/02/23 and 10/03/23 appt.   Per previous notes from 10/25/23, pt was advised he must be seen first before getting refills on meds.   Last refilled adderall 20mg  07/27/23 #60. Last reiflled oxybutynin 10mg  01/25/23 #30

## 2023-12-13 NOTE — Telephone Encounter (Signed)
 Pt now in [place], he is asking if both oxybutynin (DITROPAN XL) 10 MG 24 hr tablet & amphetamine-dextroamphetamine  can be called into [facility] [phone].  Pt has scheduled his f/u with Aundra Millet, NP because he has confirmed to have no new issues, no new symptoms

## 2023-12-20 MED ORDER — OXYBUTYNIN CHLORIDE ER 10 MG PO TB24: 10.0000 mg | ORAL_TABLET | Freq: Every day | ORAL | 11 refills | Status: AC

## 2023-12-20 MED ORDER — AMPHETAMINE-DEXTROAMPHETAMINE 20 MG PO TABS: 20.0000 mg | ORAL_TABLET | Freq: Two times a day (BID) | ORAL | 0 refills | Status: AC

## 2023-12-20 NOTE — Telephone Encounter (Signed)
 Last seen 01/25/23, next appt scheduled3/14/25  Outpatient Orders     The patient is taking this medication long-term.      Start Date End Date Dispense Refills Pharmacy   amphetamine-dextroamphetamine (ADDERALL) 20 MG tablet 07/27/2023 -- 60 tablet 0 [facility] 09...   Take 1 tablet (20 mg total) by mouth 2 (two) times daily.    amphetamine-dextroamphetamine (ADDERALL) 20 MG tablet 04/22/2023 07/27/2023 60 tablet 0 [facility] 09...   Take 1 tablet (20 mg total) by mouth 2 (two) times daily.    amphetamine-dextroamphetamine (ADDERALL) 20 MG tablet 03/17/2023 04/21/2023 60 tablet 0 [facility] 09...   Take 1 tablet (20 mg total) by mouth 2 (two) times daily.    amphetamine-dextroamphetamine (ADDERALL) 20 MG tablet 02/14/2023 03/17/2023 60 tablet 0 [facility] 09...   Take 1 tablet (20 mg total) by mouth 2 (two) times daily.    amphetamine-dextroamphetamine (ADDERALL) 20 MG tablet 01/13/2023 02/14/2023 60 tablet 0 [facility] 09...   Take 1 tablet (20 mg total) by mouth 2 (two) times daily.    amphetamine-dextroamphetamine (ADDERALL) 20 MG tablet 12/16/2022 01/13/2023 60 tablet 0 [facility] 09...   Take 1 tablet (20 mg total) by mouth 2 (two) times daily.    amphetamine-dextroamphetamine (ADDERALL) 20 MG tablet 11/17/2022 12/16/2022 60 tablet 0 [facility] 09...   Take 1 tablet (20 mg total) by mouth 2 (two) times daily.    amphetamine-dextroamphetamine (ADDERALL) 20 MG tablet 10/22/2022 11/17/2022 60 tablet 0 [facility] 09...   Take 1 tablet (20 mg total) by mouth 2 (two) times daily.    Dispenses  No dispenses within 180 days of the adherence period (since 12/17/2022)

## 2023-12-23 ENCOUNTER — Telehealth: Admitting: Adult Health

## 2023-12-23 ENCOUNTER — Telehealth: Payer: Self-pay | Admitting: Adult Health

## 2023-12-23 NOTE — Progress Notes (Deleted)
 PATIENT: Gabriel Burns DOB: 09/29/1971  REASON FOR VISIT: follow up HISTORY FROM: patient  Virtual Visit via Video Note  I connected with Gabriel Burns on 12/23/23 at  9:15 AM EDT by a video enabled telemedicine application located remotely at [facility] and verified that I am speaking with the correct person using two identifiers who was located at their own home.   I discussed the limitations of evaluation and management by telemedicine and the availability of in person appointments. The patient expressed understanding and agreed to proceed.   PATIENT: Gabriel Burns DOB: 07-Sep-1971  REASON FOR VISIT: follow up HISTORY FROM: patient  HISTORY OF PRESENT ILLNESS: Today 12/23/23  HISTORY 01/25/2023 He is going to start Ocrevus but he is having some issues getting the next infusion set up.   I spoke to the infusion center and they feel he will be getting started very soon.      He is noting more issues with urinary urgency with occasional incontinence.  He also feels his depression is doing worse.  Physically, he feels he is fairly stable compared to his last visit.    He has right > left, leg > arm weakness and right > left spasticity.  He does not feel the baclofen has helped the spasticity much.  He tried 3 times a day.  Gait is poor.    He has an right AFO and uses a cane.   He can only go about 100-150 feet without a break.   He has dysesthetic pain in right > left leg and the right arm.   He has pixilated vision' if he is hot or walks longer.  He needs reading glasses.      He has urinary urgency and occasional urge incontinence and occasional fecal incontinence (with urge first).   He has ED.Marland Kitchen  He was once on oxybutynin and feels it helped him some.  He is no longer on that.   He has fatigue, much worse with heat.   He gets so tired in heat that he can't rise from the ground without help.   He sleeps poorly at night with difficulty  falling and staying asleep.    No naps.   No EDS.    He snores some but no OSA signs.    He has depression.   He is on venlafaxine and buproprion.    He has difficulty with completion. He starts projects but never finishes.  He has reduced memory, helped with hints   He has back pain and pain that radiates down the leg.   He has a history of hepatitis C that was treated.  PCR 11/08/2019 was negative.   MS History: He was diagnosed with MS in 2010.  At the time he had right leg weakness and poor gait.    He had milder weakness on the right arm.   He saw Dr. Ronalee Red and then Dr. Elwyn Reach.  He was placed on Copaxone.  He had more exacerbations with weakness and phasic spasms in his legs.   He had a lot of steroid treatments and was switched to Tysabri.   He didn't think he did well on it.  A couple years ago he went on Kesimpta but had some progression.   He has not been on it nearly two years.     He has been seen at [facility] recently.  (Dr. Elwyn Reach and Dr. Ronalee Red).  Imaging personally reviewed:  MRI of the thoracic spine 02/16/2016 shows T2 hyperintense foci at T6-T7, T8-T9 and T11-T12.  Mild degenerative changes but no spinal stenosis   MRI of the cervical spine 02/13/2016 shows multiple T2/FLAIR hyperintense foci with discrete lesions anteriorly adjacent to C2-C3, to the right adjacent to C3, anterior centrally adjacent to C5, posteriorly adjacent to C6,   Of the brain 02/13/2016 and 07/04/2014 shows multiple T2/FLAIR hyperintense foci.  There are confluencies in the periatrial white matter.  Some of the foci are radially oriented to the ventricles.  Some foci are in the juxtacortical white matter.  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  No definite infratentorial foci were noted..  No enhancing lesions.  Possibly some progression of the periatrial white matter foci between 2015 and 201 (or just due to differences in technique).   MRI of the lumbar spine 05/26/2017 shows disc  bulging and borderline spinal stenosis at L3-L4 with mild foraminal and lateral recess stenosis but no nerve root compression.  At L4-L5, he has a right paramedian disc protrusion with moderate foraminal narrowing right greater than left moderately severe lateral recess stenosis.   Laboratory: Oligoclonal bands 02/15/2012 were positive at 12. Hepatitis C PCR was positive in 2020 and negative in 2021 after treatment. 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  HIV was negative.    REVIEW OF SYSTEMS: Out of a complete 14 system review of symptoms, the patient complains only of the following symptoms, and all other reviewed systems are negative.  ALLERGIES: No Known Allergies  HOME MEDICATIONS: Outpatient Medications Prior to Visit  Medication Sig Dispense Refill   acetaminophen (TYLENOL) 500 MG tablet Take 2 tablets (1,000 mg total) by mouth every 6 (six) hours. 30 tablet 0   amantadine (SYMMETREL) 100 MG capsule Take 1 capsule (100 mg total) by mouth 2 (two) times daily. 60 capsule 11   amphetamine-dextroamphetamine (ADDERALL) 20 MG tablet Take 1 tablet (20 mg total) by mouth 2 (two) times daily. 60 tablet 0   baclofen (LIORESAL) 10 MG tablet Take 1 tablet (10 mg total) by mouth 3 (three) times daily. 90 each 5   buPROPion (WELLBUTRIN SR) 150 MG 12 hr tablet Take 150 mg by mouth 2 (two) times daily.     finasteride (PROSCAR) 5 MG tablet Take 5 mg by mouth daily.     gabapentin (NEURONTIN) 300 MG capsule Take 900 mg by mouth at bedtime.     gabapentin (NEURONTIN) 300 MG capsule TAKE 1 CAPSULE BY MOUTH EVERY MORNING; TAKE 1 CAPSULE BY MOUTH DAILY IN THE AFTERNOON AND TAKE THREE CAPSULES BY MOUTH NIGHTLY 150 capsule 5   oxybutynin (DITROPAN XL) 10 MG 24 hr tablet Take 1 tablet (10 mg total) by mouth at bedtime. 30 tablet 11   oxyCODONE (OXY IR/ROXICODONE) 5 MG immediate release tablet Take 5 mg by mouth every 6 (six) hours as needed for severe pain.     venlafaxine XR (EFFEXOR-XR) 150 MG 24 hr  capsule Take 150 mg by mouth daily.     No facility-administered medications prior to visit.    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   Spinal headache    woke up during procedure  PAST SURGICAL HISTORY: Past Surgical History:  Procedure Laterality Date   BACK SURGERY     EPIDURAL BLOOD PATCH  ~ 11//2018   "for CSF leak"   LITHOTRIPSY     LUMBAR LAMINECTOMY  07/24/2017   "took piece off vertebrae; had a bulging herniated disc"   LUMBAR WOUND DEBRIDEMENT N/A 09/07/2017   Procedure: Lumbar wound exploration with placement of lumbar drain;  Surgeon: Ditty, Loura Halt, MD;  Location: [facility] OR;  Service: Neurosurgery;  Laterality: N/A;   PLACEMENT OF LUMBAR DRAIN N/A 09/07/2017   Procedure: PLACEMENT OF LUMBAR DRAIN;  Surgeon: Ditty, Loura Halt, MD;  Location: [facility] OR;  Service: Neurosurgery;  Laterality: N/A;   REPAIR OF CEREBROSPINAL FLUID LEAK N/A 08/30/2017   Procedure: 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;  Surgeon: Ditty, Loura Halt, MD;  Location: [facility] OR;  Service: Neurosurgery;  Laterality: N/A;  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   WOUND EXPLORATION  08/30/2017   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/notes 08/30/2017    FAMILY HISTORY: Family History  Problem Relation Age of Onset   Hypertension Father    Alcohol abuse Paternal Grandfather     SOCIAL HISTORY: Social History   Socioeconomic History   Marital status: Divorced    Spouse name: Not on file   Number of children: 3   Years of education: GED   Highest education level: Not on file  Occupational History   Occupation: Disability     Comment: MS  Tobacco Use   Smoking status: Every  Day    Current packs/day: 1.00    Average packs/day: 1 pack/day for 30.0 years (30.0 ttl pk-yrs)    Types: Cigars, Cigarettes   Smokeless tobacco: Never  Vaping Use   Vaping status: Never Used  Substance and Sexual Activity   Alcohol use: No   Drug use: Yes    Types: Marijuana    Comment: 08/30/2017 "couple times/day"   Sexual activity: Yes  Other Topics Concern   Not on file  Social History Narrative   Fun: Draw (anything), realism.   Denies religious beliefs effecting health care.    Social Drivers of Health   Financial Resource Strain: Medium Risk (12/07/2018)   Received from [facility], [facility]   Overall Financial Resource Strain (CARDIA)    Difficulty of Paying Living Expenses: Somewhat hard  Food Insecurity: Food Insecurity Present (12/07/2018)   Received [facility], [facility]   Hunger Vital Sign    Worried About Running Out of Food in the Last Year: Sometimes true    Ran Out of Food in the Last Year: Sometimes true  Transportation Needs: No Transportation Needs (12/07/2018)   Received from [facility], [facility]   [facility] - Transportation    In the past 12 months, has lack of transportation kept you from medical appointments or from getting medications?: No    Lack of Transportation (Non-Medical): No  Physical Activity: Inactive (12/07/2018)   Received from [facility], [facility]   Exercise Vital Sign    Days of Exercise per Week: 0 days    Minutes of Exercise per Session: 0 min  Stress: Stress Concern Present (12/07/2018)   Received from [facility], [facility]   [employer] [facility] - Occupational Stress Questionnaire    Feeling of Stress : To some extent  Social Connections: Unknown (02/21/2022)   Received from [facility], [facility]   Social  Network    Social Network: Not on file  Intimate Partner Violence: Unknown (01/13/2022)   Received from [facility], [facility]   HITS    Physically Hurt: Not on file    Insult or Talk Down To: Not on file    Threaten Physical Harm: Not on file    Scream or Curse: Not on file      PHYSICAL EXAM Generalized: Well developed, in no acute distress   Neurological examination  Mentation: Alert oriented to time, place, history taking. Follows all commands speech and language fluent Cranial nerve Burns-XII:Extraocular movements were full. Facial symmetry noted. uvula tongue midline. Head turning and shoulder shrug  were normal and symmetric. Motor: Good strength throughout subjectively per patient Sensory: Sensory testing is intact to soft touch on all 4 extremities subjectively per patient Coordination: Cerebellar testing reveals good finger-nose-finger  Gait and station: Patient is able to stand from a seated position. gait is normal.  Reflexes: UTA  DIAGNOSTIC DATA (LABS, IMAGING, TESTING) - I reviewed patient records, labs, notes, testing and imaging myself where available.  Lab Results  Component Value Date   WBC 6.2 09/30/2022   HGB 13.8 09/30/2022   HCT 41.0 09/30/2022   MCV 94.7 09/30/2022   PLT 241 09/30/2022      Component Value Date/Time   NA 131 (L) 09/30/2022 1118   NA 141 06/24/2022 1435   K 3.9 09/30/2022 1118   CL 99 09/30/2022 1118   CO2 22 09/30/2022 1118   GLUCOSE 109 (H) 09/30/2022 1118   BUN 8 09/30/2022 1118   BUN 11 06/24/2022 1435   CREATININE 0.95 09/30/2022 1118   CALCIUM 9.1 09/30/2022 1118   PROT 7.4 06/24/2022 1435   ALBUMIN 4.7 06/24/2022 1435   AST 20 06/24/2022 1435   ALT 12 06/24/2022 1435   ALKPHOS 119 06/24/2022 1435   BILITOT 0.5 06/24/2022 1435   GFRNONAA >60 09/30/2022 1118   GFRAA >60 08/31/2017 0329   No results found for: "CHOL", "HDL", "LDLCALC", "LDLDIRECT", "TRIG", "CHOLHDL" No results found for: "HGBA1C" No results found  for: "VITAMINB12" No results found for: "TSH"    ASSESSMENT AND PLAN 53 y.o. 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 here with ***   I spent 15 minutes with the patient. 50% of this time was spent   Butch Penny, MSN, NP-C 12/23/2023, 9:11 AM [facility] [address], [address] [place], [place] [address] [phone]

## 2023-12-23 NOTE — Telephone Encounter (Signed)
 Patient was scheduled for a virtual visit today.  He did not logon for the virtual visit.  Next appointment should be scheduled in office

## 2024-02-27 ENCOUNTER — Telehealth: Payer: Self-pay | Admitting: Neurology

## 2024-02-27 NOTE — Telephone Encounter (Signed)
 Noted

## 2024-02-27 NOTE — Telephone Encounter (Signed)
 Pt called stating he has not received medication  amphetamine -dextroamphetamine  (ADDERALL) 20 MG tablet In formed Pt that  medication can't be filled until appointment with Provider . Also informed Pt he can ask his PCP if they would be able to  prescribe medication until next appointment.

## 2024-02-27 NOTE — Telephone Encounter (Addendum)
 Spoke with Dr. Godwin Lat. Pt has to be seen first before getting refills at this point. He can request from PCP until seen at our office.  Tried calling pt back, mailbox full.

## 2024-02-27 NOTE — Telephone Encounter (Signed)
 Last seen 01/25/23 and next f/u 05/14/24. Last refilled 12/20/23 #60. No showed VV 12/23/23 that Dr. Godwin Lat approved back January 2025 since he is disabled and hard for him to get to office. (He also no showed 08/02/23 and 10/03/23 appt)

## 2024-02-27 NOTE — Telephone Encounter (Signed)
 Pt is requesting a refill for (ADDERALL) 20 MG tablet.  Pharmacy: [facility] [phone]

## 2024-05-14 ENCOUNTER — Ambulatory Visit: Admitting: Adult Health

## 2024-05-14 ENCOUNTER — Encounter: Payer: Self-pay | Admitting: Adult Health

## 2025-03-26 ENCOUNTER — Ambulatory Visit: Admitting: Neurology
